# Patient Record
Sex: Female | Born: 1945 | Race: White | Hispanic: No | Marital: Married | Smoking: Current every day smoker
Health system: Southern US, Community
[De-identification: ages and names within clinical notes are randomized; demographics above are authoritative.]

## PROBLEM LIST (undated history)

## (undated) DIAGNOSIS — S61419A Laceration without foreign body of unspecified hand, initial encounter: Secondary | ICD-10-CM

## (undated) DIAGNOSIS — F410 Panic disorder [episodic paroxysmal anxiety] without agoraphobia: Secondary | ICD-10-CM

## (undated) DIAGNOSIS — F4001 Agoraphobia with panic disorder: Secondary | ICD-10-CM

## (undated) DIAGNOSIS — F329 Major depressive disorder, single episode, unspecified: Secondary | ICD-10-CM

## (undated) DIAGNOSIS — Z87442 Personal history of urinary calculi: Secondary | ICD-10-CM

---
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
## 1998-01-01 ENCOUNTER — Inpatient Hospital Stay (HOSPITAL_COMMUNITY): Admission: EM | Admit: 1998-01-01 | Discharge: 1998-01-04 | Payer: Self-pay | Admitting: Emergency Medicine

## 1998-02-21 ENCOUNTER — Other Ambulatory Visit: Admission: RE | Admit: 1998-02-21 | Discharge: 1998-02-21 | Payer: Self-pay | Admitting: Obstetrics and Gynecology

## 1999-05-09 ENCOUNTER — Other Ambulatory Visit: Admission: RE | Admit: 1999-05-09 | Discharge: 1999-05-09 | Payer: Self-pay | Admitting: Obstetrics and Gynecology

## 2000-05-12 ENCOUNTER — Other Ambulatory Visit: Admission: RE | Admit: 2000-05-12 | Discharge: 2000-05-12 | Payer: Self-pay | Admitting: Obstetrics and Gynecology

## 2000-11-06 ENCOUNTER — Emergency Department (HOSPITAL_COMMUNITY): Admission: EM | Admit: 2000-11-06 | Discharge: 2000-11-06 | Payer: Self-pay | Admitting: *Deleted

## 2000-11-06 ENCOUNTER — Encounter: Payer: Self-pay | Admitting: Emergency Medicine

## 2001-07-20 ENCOUNTER — Other Ambulatory Visit: Admission: RE | Admit: 2001-07-20 | Discharge: 2001-07-20 | Payer: Self-pay | Admitting: Obstetrics and Gynecology

## 2002-12-26 ENCOUNTER — Other Ambulatory Visit: Admission: RE | Admit: 2002-12-26 | Discharge: 2002-12-26 | Payer: Self-pay | Admitting: Obstetrics and Gynecology

## 2005-02-07 ENCOUNTER — Ambulatory Visit: Payer: Self-pay | Admitting: Internal Medicine

## 2005-11-20 ENCOUNTER — Ambulatory Visit: Payer: Self-pay | Admitting: Internal Medicine

## 2006-09-20 ENCOUNTER — Emergency Department (HOSPITAL_COMMUNITY): Admission: EM | Admit: 2006-09-20 | Discharge: 2006-09-20 | Payer: Self-pay | Admitting: Emergency Medicine

## 2006-10-02 ENCOUNTER — Emergency Department (HOSPITAL_COMMUNITY): Admission: EM | Admit: 2006-10-02 | Discharge: 2006-10-02 | Payer: Self-pay | Admitting: Emergency Medicine

## 2006-10-15 ENCOUNTER — Ambulatory Visit: Payer: Self-pay | Admitting: Internal Medicine

## 2006-10-23 ENCOUNTER — Telehealth: Payer: Self-pay | Admitting: Internal Medicine

## 2006-10-30 ENCOUNTER — Telehealth: Payer: Self-pay | Admitting: Internal Medicine

## 2006-12-07 ENCOUNTER — Telehealth: Payer: Self-pay | Admitting: Internal Medicine

## 2006-12-24 DIAGNOSIS — F329 Major depressive disorder, single episode, unspecified: Secondary | ICD-10-CM

## 2006-12-24 DIAGNOSIS — F411 Generalized anxiety disorder: Secondary | ICD-10-CM | POA: Insufficient documentation

## 2006-12-24 DIAGNOSIS — F32A Depression, unspecified: Secondary | ICD-10-CM | POA: Insufficient documentation

## 2007-03-04 ENCOUNTER — Telehealth: Payer: Self-pay | Admitting: Internal Medicine

## 2007-04-28 ENCOUNTER — Telehealth: Payer: Self-pay | Admitting: Internal Medicine

## 2007-07-12 ENCOUNTER — Encounter: Payer: Self-pay | Admitting: Internal Medicine

## 2007-07-19 ENCOUNTER — Telehealth: Payer: Self-pay | Admitting: Internal Medicine

## 2007-12-10 ENCOUNTER — Telehealth: Payer: Self-pay | Admitting: Internal Medicine

## 2007-12-29 ENCOUNTER — Telehealth: Payer: Self-pay | Admitting: *Deleted

## 2008-01-25 ENCOUNTER — Encounter: Payer: Self-pay | Admitting: Internal Medicine

## 2008-02-01 ENCOUNTER — Ambulatory Visit: Payer: Self-pay | Admitting: Internal Medicine

## 2008-02-01 LAB — CONVERTED CEMR LAB
ALT: 17 units/L (ref 0–35)
AST: 23 units/L (ref 0–37)
Albumin: 4 g/dL (ref 3.5–5.2)
Alkaline Phosphatase: 35 units/L — ABNORMAL LOW (ref 39–117)
BUN: 13 mg/dL (ref 6–23)
Basophils Absolute: 0 10*3/uL (ref 0.0–0.1)
Basophils Relative: 0.1 % (ref 0.0–3.0)
Bilirubin, Direct: 0.1 mg/dL (ref 0.0–0.3)
CO2: 30 meq/L (ref 19–32)
Calcium: 9.1 mg/dL (ref 8.4–10.5)
Chloride: 108 meq/L (ref 96–112)
Cholesterol: 246 mg/dL (ref 0–200)
Creatinine, Ser: 0.9 mg/dL (ref 0.4–1.2)
Direct LDL: 161.7 mg/dL
Eosinophils Absolute: 0.3 10*3/uL (ref 0.0–0.7)
Eosinophils Relative: 3.6 % (ref 0.0–5.0)
GFR calc Af Amer: 82 mL/min
GFR calc non Af Amer: 68 mL/min
Glucose, Bld: 92 mg/dL (ref 70–99)
HCT: 38.4 % (ref 36.0–46.0)
HDL: 54.8 mg/dL (ref >39.0)
Hemoglobin: 13.2 g/dL (ref 12.0–15.0)
Lymphocytes Relative: 44 % (ref 12.0–46.0)
MCHC: 34.4 g/dL (ref 30.0–36.0)
MCV: 91.6 fL (ref 78.0–100.0)
Monocytes Absolute: 0.7 10*3/uL (ref 0.1–1.0)
Monocytes Relative: 8.9 % (ref 3.0–12.0)
Neutro Abs: 3.6 10*3/uL (ref 1.4–7.7)
Neutrophils Relative %: 43.4 % (ref 43.0–77.0)
Platelets: 218 10*3/uL (ref 150–400)
Potassium: 3.9 meq/L (ref 3.5–5.1)
RBC: 4.19 M/uL (ref 3.87–5.11)
RDW: 12.4 % (ref 11.5–14.6)
Sodium: 145 meq/L (ref 135–145)
TSH: 1.58 microintl units/mL (ref 0.35–5.50)
Total Bilirubin: 0.4 mg/dL (ref 0.3–1.2)
Total CHOL/HDL Ratio: 4.5
Total Protein: 6.9 g/dL (ref 6.0–8.3)
Triglycerides: 63 mg/dL (ref 0–149)
VLDL: 13 mg/dL (ref 0–40)
WBC: 8.4 10*3/uL (ref 4.5–10.5)

## 2008-03-03 ENCOUNTER — Telehealth: Payer: Self-pay | Admitting: Internal Medicine

## 2008-04-19 ENCOUNTER — Ambulatory Visit: Payer: Self-pay | Admitting: Internal Medicine

## 2008-04-19 DIAGNOSIS — H6120 Impacted cerumen, unspecified ear: Secondary | ICD-10-CM

## 2008-04-19 DIAGNOSIS — M810 Age-related osteoporosis without current pathological fracture: Secondary | ICD-10-CM | POA: Insufficient documentation

## 2008-04-19 DIAGNOSIS — E785 Hyperlipidemia, unspecified: Secondary | ICD-10-CM

## 2008-05-03 ENCOUNTER — Telehealth: Payer: Self-pay | Admitting: *Deleted

## 2008-10-03 ENCOUNTER — Telehealth: Payer: Self-pay | Admitting: Internal Medicine

## 2009-01-17 ENCOUNTER — Telehealth: Payer: Self-pay | Admitting: Internal Medicine

## 2010-05-12 LAB — CONVERTED CEMR LAB: Vit D, 1,25-Dihydroxy: 46 (ref 30–89)

## 2011-05-16 ENCOUNTER — Telehealth: Payer: Self-pay | Admitting: Internal Medicine

## 2011-05-16 NOTE — Telephone Encounter (Signed)
Pt called and was last seen on 04/19/08, which is just over 3 years. Pt has [employer] now, but will have MCR and supplement in March 2013. Pt is req to come in for cpx. Pls advise.

## 2011-05-19 NOTE — Telephone Encounter (Signed)
She may have appointment

## 2011-05-19 NOTE — Telephone Encounter (Signed)
Called pt and notified her that pcp has agreed for pt to re-est with him. Pt has been sch a 30 min ov for 06/11/11 at 2:45pm. Pt req cpx and to come in fasting.

## 2011-06-11 ENCOUNTER — Ambulatory Visit: Payer: Self-pay | Admitting: Internal Medicine

## 2011-07-23 ENCOUNTER — Ambulatory Visit: Payer: Self-pay | Admitting: Internal Medicine

## 2011-09-04 ENCOUNTER — Ambulatory Visit: Payer: Self-pay | Admitting: Internal Medicine

## 2011-09-04 ENCOUNTER — Ambulatory Visit: Payer: Self-pay | Admitting: *Deleted

## 2011-09-05 ENCOUNTER — Ambulatory Visit: Payer: Self-pay | Admitting: Internal Medicine

## 2011-09-09 ENCOUNTER — Ambulatory Visit: Payer: Self-pay | Admitting: Internal Medicine

## 2011-09-12 ENCOUNTER — Telehealth: Payer: Self-pay | Admitting: Internal Medicine

## 2011-09-12 NOTE — Telephone Encounter (Signed)
Per dr Lovell Sheehan- she can come to Saturday clinic, but dr Lovell Sheehan said if she doesn't come ,we will not schedule her with anyone until after July 15 because she has no showed and cancelled so many times

## 2011-09-12 NOTE — Telephone Encounter (Signed)
Patient called back upset that she can not get in before 10/27/11 as she states she can not hear. Patient want to know if she can come in sooner. What dones Dr. Lovell Sheehan recommend?

## 2011-09-13 ENCOUNTER — Ambulatory Visit: Payer: Self-pay | Admitting: Family Medicine

## 2011-10-27 ENCOUNTER — Ambulatory Visit: Payer: Self-pay | Admitting: Internal Medicine

## 2011-12-08 ENCOUNTER — Ambulatory Visit: Payer: Self-pay | Admitting: Internal Medicine

## 2011-12-16 ENCOUNTER — Ambulatory Visit: Payer: Self-pay | Admitting: Family

## 2011-12-23 ENCOUNTER — Ambulatory Visit: Payer: Self-pay | Admitting: Family

## 2012-07-09 ENCOUNTER — Telehealth: Payer: Self-pay | Admitting: Internal Medicine

## 2012-07-09 NOTE — Telephone Encounter (Signed)
Tim Lair,  Please see Dr. Lovell Sheehan' response below re Ms. Bath.

## 2012-07-09 NOTE — Telephone Encounter (Signed)
I think discharging the patient  Is best option

## 2012-07-09 NOTE — Telephone Encounter (Signed)
Dr Lovell Sheehan - Mr. Pizzolato called in for [facility], wanting an appointment. She has not been seen since 04/19/2008, and she has canceled or no showed every appointment since that time (11). I spoke w/[place] and [place]. Tim Lair did not want to schedule her with you or anyone until speaking to you, given Mrs. Burandt appt hx. Do you want to discharge her as a patient? Please advise as to next steps for this pt. Thank you.

## 2012-07-13 NOTE — Telephone Encounter (Signed)
Mr. Clyatt called back because he has not heard from the office regarding an appt for his wife. Please advise, regarding dismissal.

## 2012-07-14 ENCOUNTER — Telehealth: Payer: Self-pay | Admitting: Internal Medicine

## 2012-07-14 NOTE — Telephone Encounter (Signed)
Dismissal Letter sent by Certified Mail 07/14/2012  Received the Return Receipt showing someone picked up the Dismissal Letter 07/20/2012

## 2012-07-22 NOTE — Telephone Encounter (Signed)
Discharge letter generated on 07/09/12.

## 2015-03-28 ENCOUNTER — Encounter (HOSPITAL_COMMUNITY): Payer: Self-pay | Admitting: Neurology

## 2015-03-28 ENCOUNTER — Emergency Department (HOSPITAL_COMMUNITY): Payer: Medicare Other

## 2015-03-28 ENCOUNTER — Observation Stay (HOSPITAL_COMMUNITY)
Admission: EM | Admit: 2015-03-28 | Discharge: 2015-03-29 | Disposition: A | Discharge status: Home / Self Care | Payer: Medicare Other | Attending: Student in an Organized Health Care Education/Training Program | Admitting: Student in an Organized Health Care Education/Training Program

## 2015-03-28 DIAGNOSIS — Y93G3 Activity, cooking and baking: Secondary | ICD-10-CM | POA: Insufficient documentation

## 2015-03-28 DIAGNOSIS — S61412A Laceration without foreign body of left hand, initial encounter: Secondary | ICD-10-CM | POA: Diagnosis present

## 2015-03-28 DIAGNOSIS — L03114 Cellulitis of left upper limb: Secondary | ICD-10-CM | POA: Insufficient documentation

## 2015-03-28 DIAGNOSIS — M19042 Primary osteoarthritis, left hand: Secondary | ICD-10-CM | POA: Diagnosis not present

## 2015-03-28 DIAGNOSIS — Y998 Other external cause status: Secondary | ICD-10-CM | POA: Diagnosis not present

## 2015-03-28 DIAGNOSIS — Y929 Unspecified place or not applicable: Secondary | ICD-10-CM

## 2015-03-28 DIAGNOSIS — Y92000 Kitchen of unspecified non-institutional (private) residence as  the place of occurrence of the external cause: Secondary | ICD-10-CM | POA: Insufficient documentation

## 2015-03-28 DIAGNOSIS — W260XXD Contact with knife, subsequent encounter: Secondary | ICD-10-CM

## 2015-03-28 DIAGNOSIS — F411 Generalized anxiety disorder: Secondary | ICD-10-CM | POA: Diagnosis not present

## 2015-03-28 DIAGNOSIS — S61012D Laceration without foreign body of left thumb without damage to nail, subsequent encounter: Secondary | ICD-10-CM

## 2015-03-28 DIAGNOSIS — R339 Retention of urine, unspecified: Secondary | ICD-10-CM | POA: Insufficient documentation

## 2015-03-28 DIAGNOSIS — Z880 Allergy status to penicillin: Secondary | ICD-10-CM | POA: Insufficient documentation

## 2015-03-28 DIAGNOSIS — F17210 Nicotine dependence, cigarettes, uncomplicated: Secondary | ICD-10-CM | POA: Insufficient documentation

## 2015-03-28 DIAGNOSIS — W260XXA Contact with knife, initial encounter: Secondary | ICD-10-CM | POA: Diagnosis not present

## 2015-03-28 DIAGNOSIS — F4000 Agoraphobia, unspecified: Secondary | ICD-10-CM | POA: Insufficient documentation

## 2015-03-28 DIAGNOSIS — Z79899 Other long term (current) drug therapy: Secondary | ICD-10-CM | POA: Diagnosis not present

## 2015-03-28 DIAGNOSIS — M8580 Other specified disorders of bone density and structure, unspecified site: Secondary | ICD-10-CM | POA: Insufficient documentation

## 2015-03-28 DIAGNOSIS — L0390 Cellulitis, unspecified: Secondary | ICD-10-CM | POA: Diagnosis present

## 2015-03-28 DIAGNOSIS — F4001 Agoraphobia with panic disorder: Secondary | ICD-10-CM

## 2015-03-28 HISTORY — DX: Panic disorder (episodic paroxysmal anxiety): F41.0

## 2015-03-28 HISTORY — DX: Laceration without foreign body of unspecified hand, initial encounter: S61.419A

## 2015-03-28 HISTORY — DX: Agoraphobia with panic disorder: F40.01

## 2015-03-28 HISTORY — DX: Major depressive disorder, single episode, unspecified: F32.9

## 2015-03-28 LAB — CBC WITH DIFFERENTIAL/PLATELET
BASOS ABS: 0.1 10*3/uL (ref 0.0–0.1)
Basophils Relative: 1 %
Eosinophils Absolute: 0.3 10*3/uL (ref 0.0–0.7)
Eosinophils Relative: 3 %
HEMATOCRIT: 41.1 % (ref 36.0–46.0)
Hemoglobin: 13.6 g/dL (ref 12.0–15.0)
LYMPHS PCT: 31 %
Lymphs Abs: 3.3 10*3/uL (ref 0.7–4.0)
MCH: 30.2 pg (ref 26.0–34.0)
MCHC: 33.1 g/dL (ref 30.0–36.0)
MCV: 91.3 fL (ref 78.0–100.0)
MONO ABS: 1 10*3/uL (ref 0.1–1.0)
Monocytes Relative: 9 %
NEUTROS ABS: 6.1 10*3/uL (ref 1.7–7.7)
Neutrophils Relative %: 56 %
Platelets: 296 10*3/uL (ref 150–400)
RBC: 4.5 MIL/uL (ref 3.87–5.11)
RDW: 13.4 % (ref 11.5–15.5)
WBC: 10.8 10*3/uL — AB (ref 4.0–10.5)

## 2015-03-28 LAB — BASIC METABOLIC PANEL
ANION GAP: 8 (ref 5–15)
BUN: 7 mg/dL (ref 6–20)
CALCIUM: 9.1 mg/dL (ref 8.9–10.3)
CO2: 24 mmol/L (ref 22–32)
CREATININE: 0.9 mg/dL (ref 0.44–1.00)
Chloride: 108 mmol/L (ref 101–111)
GFR calc non Af Amer: >60 mL/min (ref >60)
Glucose, Bld: 96 mg/dL (ref 65–99)
Potassium: 3.8 mmol/L (ref 3.5–5.1)
Sodium: 140 mmol/L (ref 135–145)

## 2015-03-28 LAB — URINALYSIS, ROUTINE W REFLEX MICROSCOPIC
Bilirubin Urine: NEGATIVE
Glucose, UA: NEGATIVE mg/dL
Hgb urine dipstick: NEGATIVE
KETONES UR: NEGATIVE mg/dL
LEUKOCYTES UA: NEGATIVE
NITRITE: NEGATIVE
PH: 5 (ref 5.0–8.0)
Protein, ur: NEGATIVE mg/dL
SPECIFIC GRAVITY, URINE: 1.01 (ref 1.005–1.030)

## 2015-03-28 MED ORDER — CLINDAMYCIN PHOSPHATE 600 MG/50ML IV SOLN
600.0000 mg | Freq: Three times a day (TID) | INTRAVENOUS | Status: DC
  Administered 2015-03-28 – 2015-03-29 (×2): 600 mg via INTRAVENOUS
  Filled 2015-03-28 (×2): qty 50

## 2015-03-28 MED ORDER — ACETAMINOPHEN 325 MG PO TABS: 650.0000 mg | ORAL_TABLET | Freq: Four times a day (QID) | ORAL | Status: DC | PRN

## 2015-03-28 MED ORDER — DIAZEPAM 5 MG PO TABS
5.0000 mg | ORAL_TABLET | Freq: Two times a day (BID) | ORAL | Status: DC | PRN
  Administered 2015-03-28 – 2015-03-29 (×2): 10 mg via ORAL
  Filled 2015-03-28 (×2): qty 2

## 2015-03-28 MED ORDER — BACITRACIN ZINC 500 UNIT/GM EX OINT
TOPICAL_OINTMENT | Freq: Every day | CUTANEOUS | Status: DC
  Administered 2015-03-29: 10:00:00 via TOPICAL
  Filled 2015-03-28 (×2): qty 28.35

## 2015-03-28 MED ORDER — SODIUM CHLORIDE 0.9 % IV SOLN: 250.0000 mL | INTRAVENOUS | Status: DC | PRN

## 2015-03-28 MED ORDER — DIAZEPAM 5 MG PO TABS: 5.0000 mg | ORAL_TABLET | Freq: Two times a day (BID) | ORAL | Status: DC | PRN

## 2015-03-28 MED ORDER — ACETAMINOPHEN 650 MG RE SUPP: 650.0000 mg | Freq: Four times a day (QID) | RECTAL | Status: DC | PRN

## 2015-03-28 MED ORDER — ALPRAZOLAM 0.5 MG PO TABS
1.0000 mg | ORAL_TABLET | Freq: Two times a day (BID) | ORAL | Status: DC
  Administered 2015-03-28 – 2015-03-29 (×2): 1 mg via ORAL
  Filled 2015-03-28 (×2): qty 2

## 2015-03-28 MED ORDER — SODIUM CHLORIDE 0.9 % IJ SOLN
3.0000 mL | Freq: Two times a day (BID) | INTRAMUSCULAR | Status: DC
  Administered 2015-03-28 (×2): 3 mL via INTRAVENOUS

## 2015-03-28 MED ORDER — ENOXAPARIN SODIUM 40 MG/0.4ML ~~LOC~~ SOLN
40.0000 mg | SUBCUTANEOUS | Status: DC
  Filled 2015-03-28: qty 0.4

## 2015-03-28 MED ORDER — LORAZEPAM 1 MG PO TABS
1.0000 mg | ORAL_TABLET | Freq: Once | ORAL | Status: AC
  Administered 2015-03-28: 1 mg via ORAL
  Filled 2015-03-28: qty 1

## 2015-03-28 MED ORDER — SODIUM CHLORIDE 0.9 % IJ SOLN: 3.0000 mL | INTRAMUSCULAR | Status: DC | PRN

## 2015-03-28 MED ORDER — CLINDAMYCIN PHOSPHATE 600 MG/50ML IV SOLN
600.0000 mg | Freq: Once | INTRAVENOUS | Status: AC
  Administered 2015-03-28: 600 mg via INTRAVENOUS
  Filled 2015-03-28: qty 50

## 2015-03-28 NOTE — Consult Note (Signed)
ORTHOPAEDIC CONSULTATION HISTORY & PHYSICAL REQUESTING PHYSICIAN: Tyson Aliasuncan Thomas Vincent, MD  Chief Complaint: Left thumb wound  HPI: Kimberly Noble is a 69 y.o. female who reportedly lacerated the radial aspect of her left thumb, at about the location of the neck of the metacarpal, on Friday, 03-16-15. She lacerated herself accidentally in the kitchen with a knife that had been slicing onions, but had not been used for any raw meat. She reports that she was initially treated at an urgent care, where the wound was closed and she was provided tetanus vaccination. She returned to the urgent care, where secondary to concerns of infection, she reports that sutures were removed. She was also placed on oral antibiotics. She was admitted today for this wound with concerns of persisting infection and lack of progress healing despite oral antibiotics.  Past Medical History  Diagnosis Date  . Phobia    History reviewed. No pertinent past surgical history. Social History   Social History  . Marital Status: Married    Spouse Name: N/A  . Number of Children: N/A  . Years of Education: N/A   Social History Main Topics  . Smoking status: Current Every Day Smoker  . Smokeless tobacco: None  . Alcohol Use: No  . Drug Use: None  . Sexual Activity: Not Asked   Other Topics Concern  . None   Social History Narrative  . None   No family history on file. Allergies  Allergen Reactions  . Penicillins Rash   Prior to Admission medications   Medication Sig Start Date End Date Taking? Authorizing Provider  ALPRAZolam Prudy Feeler(XANAX) 1 MG tablet Take 1 mg by mouth 2 (two) times daily. Anxiety   Yes Historical Provider, MD  calcium-vitamin D (OSCAL WITH D) 500-200 MG-UNIT tablet Take 1 tablet by mouth daily with breakfast.   Yes Historical Provider, MD  diazepam (VALIUM) 10 MG tablet Take 10 mg by mouth 2 (two) times daily.    Yes Historical Provider, MD  doxycycline (VIBRAMYCIN) 100 MG capsule Take 100  mg by mouth 2 (two) times daily. For 10 days. Started on 03-26-15   Yes Historical Provider, MD  Multiple Vitamins-Minerals (MULTIVITAMIN WITH MINERALS) tablet Take 1 tablet by mouth daily.   Yes Historical Provider, MD  vitamin C (ASCORBIC ACID) 500 MG tablet Take 500 mg by mouth daily.   Yes Historical Provider, MD  vitamin E 100 UNIT capsule Take 100 Units by mouth daily.   Yes Historical Provider, MD   Dg Hand Complete Left  03/28/2015  CLINICAL DATA:  69 year old female with laceration of the left hand 10 days ago. Progressive pain and swelling proximal metacarpal area. Initial encounter. EXAM: LEFT HAND - COMPLETE 3+ VIEW COMPARISON:  None. FINDINGS: Soft tissue swelling apparent over the dorsal second metatarsal. No subcutaneous gas identified. No radiopaque foreign body identified. Distal radius and ulna intact. Carpal bone alignment within normal limits. Osteopenia. Metacarpals intact. Phalanges intact. Distal joint space osteoarthritis. IMPRESSION: Soft tissue swelling about the dorsal second metacarpal. No acute osseous abnormality identified. No subcutaneous gas or retained radiopaque foreign body. Electronically Signed   By: Odessa FlemingH  Hall M.D.   On: 03/28/2015 10:55    Positive ROS: All other systems have been reviewed and were otherwise negative with the exception of those mentioned in the HPI and as above.  Physical Exam: Vitals: Refer to EMR. Constitutional:  WD, WN, NAD HEENT:  NCAT, EOMI Neuro/Psych:  Alert & oriented to person, place, and time; appropriate mood & affect Lymphatic:  No generalized extremity edema or lymphadenopathy Extremities / MSK:  The extremities are normal with respect to appearance, ranges of motion, joint stability, muscle strength/tone, sensation, & perfusion except as otherwise noted:  There is a laceration that is transverse in orientation about 1-1/2-2 cm in length on the radial aspect of the thumb over the metacarpal. There is mild redness just about the  margins of the wound with no spreading redness and no fluctuance. The MP joint is stable to varus and valgus stress. Intact light touch sensibility on the radial and ulnar aspects of the thumb tip. She can actively flex and extend and abduct the thumb. There is peeling of the epidermal layer about the wound.  Assessment: Approximately 6-week-old left thumb superficial laceration   Actions/recommendations: I used thumb pressure to open the remainder of the wound, and washed it under running water in the sink. There was no expressible purulence. There is no abscess. I peeled the epidermal tissue that was peeling, and redressed the wound.  At the present time, this patient requires no further surgical intervention, no further wound debridement, and appears not to have an abscess. I recommend allowing this wound heal secondarily, which may take 2-4 weeks. As for daily wound care, I recommend that she simply wash it in the shower or under running water with soap and water daily, placing a very small dab of bacitracin ointment on the wound itself, covering it with a sterile 2 x 2 (not Telfa) and using roller gauze or Coban to hold that in place. I think it would be fine for her to be discharged on oral antibiotics, perhaps clindamycin, to be taken for an additional 5-7 days. She is a self-described germ-o-phobe, so I think it is important that it be reiterated to her not to treat the wound and any additional ways, not to apply hand sanitizer or other compounds to the wound, not to use Betadine or peroxide on it, etc. She should be encouraged to use her thumb as she tolerates.  She may follow-up with me in a week for a wound check.  Cliffton Asters Janee Morn, MD      Orthopaedic & Hand Surgery [facility] [address] [place], [place]  [address] Office: [phone] Mobile: [phone]

## 2015-03-28 NOTE — ED Provider Notes (Signed)
CSN: [id number]   Arrival date & time 03/28/15  1478 History   First MD Initiated Contact with Patient 03/28/15 1005     Chief Complaint  Patient presents with  . Extremity Laceration     HPI   Kimberly Noble is a 69 y.o. female with a PMH of panic disorder and agoraphobia who presents to the ED with infected laceration. She states she was cutting an onion on 12/2 and cut herself with the knife. She went to urgent care and had sutures placed at that time. She states over the next week, she started to notice discharge coming from her wound, and returned to urgent care on 12/9 and subsequently had her sutures removed. She was started on doxycycline, which she states she has been taking as directed. She reports persistent edema, erythema, and drainage. She denies fever, chills, numbness, weakness, paresthesia.   Past Medical History  Diagnosis Date  . Phobia    History reviewed. No pertinent past surgical history. No family history on file. Social History  Substance Use Topics  . Smoking status: Current Every Day Smoker  . Smokeless tobacco: None  . Alcohol Use: No   OB History    No data available      Review of Systems  Constitutional: Negative for fever and chills.  Skin: Positive for color change and wound.  Neurological: Negative for weakness and numbness.  All other systems reviewed and are negative.     Allergies  Penicillins  Home Medications   Prior to Admission medications   Medication Sig Start Date End Date Taking? Authorizing Provider  ALPRAZolam Prudy Feeler) 1 MG tablet Take 1 mg by mouth 2 (two) times daily. Anxiety   Yes Historical Provider, MD  calcium-vitamin D (OSCAL WITH D) 500-200 MG-UNIT tablet Take 1 tablet by mouth daily with breakfast.   Yes Historical Provider, MD  diazepam (VALIUM) 10 MG tablet Take 10 mg by mouth 2 (two) times daily.    Yes Historical Provider, MD  doxycycline (VIBRAMYCIN) 100 MG capsule Take 100 mg by mouth 2 (two) times  daily. For 10 days. Started on 03-26-15   Yes Historical Provider, MD  Multiple Vitamins-Minerals (MULTIVITAMIN WITH MINERALS) tablet Take 1 tablet by mouth daily.   Yes Historical Provider, MD  vitamin C (ASCORBIC ACID) 500 MG tablet Take 500 mg by mouth daily.   Yes Historical Provider, MD  vitamin E 100 UNIT capsule Take 100 Units by mouth daily.   Yes Historical Provider, MD    BP 99/74 mmHg  Pulse 90  Temp(Src) 98.1 F (36.7 C) (Oral)  Resp 18  SpO2 98% Physical Exam  Constitutional: She is oriented to person, place, and time. She appears well-developed and well-nourished. No distress.  Patient appears anxious.  HENT:  Head: Normocephalic and atraumatic.  Right Ear: External ear normal.  Left Ear: External ear normal.  Nose: Nose normal.  Eyes: Conjunctivae and EOM are normal. Right eye exhibits no discharge. Left eye exhibits no discharge. No scleral icterus.  Neck: Normal range of motion. Neck supple.  Cardiovascular: Normal rate, regular rhythm and intact distal pulses.   Pulmonary/Chest: Effort normal and breath sounds normal. No respiratory distress.  Musculoskeletal: Normal range of motion. She exhibits edema and tenderness.  Neurological: She is alert and oriented to person, place, and time. She has normal strength. No sensory deficit.  Skin: Skin is warm and dry. She is not diaphoretic. There is erythema.  2 cm laceration to left thenar eminence with  surrounding erythema, edema, and purulent drainage. Mild associated TTP.  Psychiatric: She has a normal mood and affect. Her behavior is normal.  Nursing note and vitals reviewed.   ED Course  Procedures (including critical care time)  Labs Review Labs Reviewed  CBC WITH DIFFERENTIAL/PLATELET - Abnormal; Notable for the following:    WBC 10.8 (*)    All other components within normal limits  BASIC METABOLIC PANEL  URINALYSIS, ROUTINE W REFLEX MICROSCOPIC (NOT AT [facility])  CBC    Imaging Review Dg Hand Complete  Left  03/28/2015  CLINICAL DATA:  69 year old female with laceration of the left hand 10 days ago. Progressive pain and swelling proximal metacarpal area. Initial encounter. EXAM: LEFT HAND - COMPLETE 3+ VIEW COMPARISON:  None. FINDINGS: Soft tissue swelling apparent over the dorsal second metatarsal. No subcutaneous gas identified. No radiopaque foreign body identified. Distal radius and ulna intact. Carpal bone alignment within normal limits. Osteopenia. Metacarpals intact. Phalanges intact. Distal joint space osteoarthritis. IMPRESSION: Soft tissue swelling about the dorsal second metacarpal. No acute osseous abnormality identified. No subcutaneous gas or retained radiopaque foreign body. Electronically Signed   By: Odessa FlemingH  Hall M.D.   On: 03/28/2015 10:55     I have personally reviewed and evaluated these images and lab results as part of my medical decision-making.   EKG Interpretation None      MDM   Final diagnoses:  Cellulitis of left hand    69 year old female presents with infection overlying laceration of her left hand (failed doxycyline as an outpatient). Reports erythema, edema, and purulent drainage. Denies fever, chills, abdominal pain, N/V, numbness, weakness, paresthesia. On exam, patient has 2 cm laceration to her left thenar eminence with surrounding erythema, edema, and purulent drainage and mild associated TTP.   Imaging of left hand remarkable for soft tissue swelling of the dorsal second metacarpal, no acute osseous abnormality, no subcutaneous gas or retained radiopaque foreign body. CBC remarkable for mild leukocytosis of 10.8, BMP within normal limits.  Patient started on IV clindamycin. Patient discussed with and seen by Dr. Rubin PayorPickering. Unassigned consulted for admission. Patient to be admitted to internal medicine teaching service (Dr. Oswaldo DoneVincent) for further evaluation and management.  BP 99/74 mmHg  Pulse 90  Temp(Src) 98.1 F (36.7 C) (Oral)  Resp 18  SpO2  98%     [name], PA-C 03/28/15 1928  Benjiman CoreNathan Pickering, MD 03/29/15 [phone]

## 2015-03-28 NOTE — ED Notes (Signed)
Spoke with ED Doctor blood cultures not ordered at this time and start antibiotics.

## 2015-03-28 NOTE — H&P (Signed)
Date: 03/28/2015               Patient Name:  Kimberly Noble MRN: [id number]  DOB: [birth year] Age / Sex: 69 y.o., female   PCP: No primary care provider on file.         Medical Service: Internal Medicine Teaching Service         Attending Physician: Dr. Tyson Alias, MD    First Contact: Dr. Reubin Milan Pager: [phone]  Second Contact: Dr. Griffin Basil Pager: [phone]       After Hours (After 5p/  First Contact Pager: [phone]  weekends / holidays): Second Contact Pager: [phone]   Chief Complaint: Thumb laceration with white/yellow drainage  History of Present Illness: Kimberly Noble is a 69yo F with PMH generalized anxiety disorder with agoraphobia, major depressive disorder who presents with a thumb laceration with drainage. Two weeks ago, she was cutting an onion with a kitchen knife and sliced her L thumb. She went to an urgent care where the laceration was stitched up and given a tetanus booster. She noted that 'her fingers were turning blue', the area around the cut was red and she could express whitish/yellowish drainage from the cut so she returned to the urgent care 5 days ago, where the stiches were removed and she was given doxycycline. She then presented to the ED today because the wound wasn't getting any better and there was still purulent drainage. Throughout this episode, she denies fevers, chills, myalgias, hand weakness or numbness. She does feel like her hand is more swollen than the unaffected hand and it is more difficult to close currently. She denies shortness of breath, chest pain, abdominal pain, nausea/vomiting, diarrhea. She does say that since yesterday, she has had been unable to void although she has tried. She denies other symptoms at this time.  PMH: Generalized anxiety disorder on long term benzodiazepines, severe agoraphobia, major depressive disorder with h/o suicide attempts  PSH: Tonsillectomy in teens  Meds: Xanax and valium PRN for  anxiety  Allergies: PCN (rash)  SH: 1/2 ppd smoker for 40 years, ~20pyh. No alcohol abuse, no h/o recreational or IV drug use.  FH: No significant history of cancer, CVD, DM, recurrent skin infections/MRSA  Meds: Current Facility-Administered Medications  Medication Dose Route Frequency Provider Last Rate Last Dose  . 0.9 %  sodium chloride infusion  250 mL Intravenous PRN Lora Paula, MD      . acetaminophen (TYLENOL) tablet 650 mg  650 mg Oral Q6H PRN Lora Paula, MD       Or  . acetaminophen (TYLENOL) suppository 650 mg  650 mg Rectal Q6H PRN Lora Paula, MD      . enoxaparin (LOVENOX) injection 40 mg  40 mg Subcutaneous Q24H Lora Paula, MD      . sodium chloride 0.9 % injection 3 mL  3 mL Intravenous Q12H Lora Paula, MD      . sodium chloride 0.9 % injection 3 mL  3 mL Intravenous PRN Lora Paula, MD        Allergies: Allergies as of 03/28/2015 - Review Complete 03/28/2015  Allergen Reaction Noted  . Penicillins Rash 12/24/2006   Past Medical History  Diagnosis Date  . Phobia    History reviewed. No pertinent past surgical history. No family history on file. Social History   Social History  . Marital Status: Married    Spouse Name: N/A  . Number of Children: N/A  .  Years of Education: N/A   Occupational History  . Not on file.   Social History Main Topics  . Smoking status: Current Every Day Smoker  . Smokeless tobacco: Not on file  . Alcohol Use: No  . Drug Use: Not on file  . Sexual Activity: Not on file   Other Topics Concern  . Not on file   Social History Narrative  . No narrative on file    Review of Systems: Pertinent items noted in HPI and remainder of comprehensive ROS otherwise negative.  Physical Exam: Blood pressure 99/74, pulse 90, temperature 98.1 F (36.7 C), temperature source Oral, resp. rate 18, SpO2 98 %. Gen: Well-appearing, alert and oriented to person, place, and time HEENT: Oropharynx clear  without erythema or exudate.  Neck: No cervical LAD, no thyromegaly or nodules, no JVD noted. CV: Normal rate, regular rhythm, no murmurs, rubs, or gallops Pulmonary: Normal effort, CTA bilaterally, no wheezing, rales, or rhonchi Abdominal: Soft, non-tender, non-distended, without rebound, guarding, or masses Extremities: Distal pulses 2+ in upper and lower extremities bilaterally, no tenderness, erythema or edema. The L hand is slightly swollen but non-erythematous. There is an appox 3cm laceration along the L thenar eminence with scant purulent drainage. Ecchymotic appearance around the laceration with erythema around the rim, without increased warmth. Skin: No atypical appearing moles. No rashes   Lab results: Basic Metabolic Panel:  Recent Labs  13/08/65 1230  NA 140  K 3.8  CL 108  CO2 24  GLUCOSE 96  BUN 7  CREATININE 0.90  CALCIUM 9.1   CBC:  Recent Labs  03/28/15 1230  WBC 10.8*  NEUTROABS 6.1  HGB 13.6  HCT 41.1  MCV 91.3  PLT 296   Imaging results:  Dg Hand Complete Left  03/28/2015  CLINICAL DATA:  69 year old female with laceration of the left hand 10 days ago. Progressive pain and swelling proximal metacarpal area. Initial encounter. EXAM: LEFT HAND - COMPLETE 3+ VIEW COMPARISON:  None. FINDINGS: Soft tissue swelling apparent over the dorsal second metatarsal. No subcutaneous gas identified. No radiopaque foreign body identified. Distal radius and ulna intact. Carpal bone alignment within normal limits. Osteopenia. Metacarpals intact. Phalanges intact. Distal joint space osteoarthritis. IMPRESSION: Soft tissue swelling about the dorsal second metacarpal. No acute osseous abnormality identified. No subcutaneous gas or retained radiopaque foreign body. Electronically Signed   By: Odessa Fleming M.D.   On: 03/28/2015 10:55   Assessment & Plan by Problem: Active Problems:   Cellulitis 1. L hand laceration - from kitchen knife, not improved over the past 2 weeks per pt  and with purulent drainage. X-ray negative for gas or foreign body retention. Pt is s/p 5 days of doxy without apparent improvement. Likely cellulitis in setting of laceration but no improvement with doxy per patient, so some concern for underlying abscess -Clinda  IV -Will consult hand surgery -Tylenol PRN for pain  2. Severe anxiety w agoraphobia - says she 'hasn't left the house in 5 years', on Xanax  BID and  Valium BID but isn't entirely sure on those dosages, either way this is significantly less than what she has taken in the past. No history of withdrawal symptoms per pt. -Continue home Xanax -Valium PRN for anxiety -Low threshold to increase benzo dose given long term high dose use  3. Urinary retention - since yesterday. Cr 0.9 here which is her baseline. No other urinary symptoms. No h/o UTIs. Possibly behavioral or iatrogenic (benzo use) vs infection. PVR here was  900ccs - will I&O cath now. -I&O cath PRN -F/u UA, C&S  Ppx - DVT - lovenox  Diet - regular  Dispo: Disposition is deferred at this time, awaiting improvement of current medical problems. Anticipated discharge in approximately 1 day(s).   The patient does not have a current PCP (No primary care provider on file.) and [facility].  The patient does have transportation limitations that hinder transportation to clinic appointments.  Signed: Darrick HuntsmanWilliam R Alaiya Martindelcampo, MD 03/28/2015, 3:55 PM

## 2015-03-28 NOTE — ED Notes (Signed)
2 weeks ago pt was cutting an onion and cut her left thumb with a knife. Went to an urgent care and had stitches placed. Went back 1 week later, told she had a staph infection was started on doxycycline. Is here for swelling to thumb and white/yellow drainage. Pt has agoraphobia and this is the first time she has left the house in 5 years, pt very anxious at current.

## 2015-03-28 NOTE — Discharge Instructions (Signed)
WOUND CARE: Remove bandage from left thumb wound. Wash it with soap and water in the shower or under running water. Apply a very small dab of bacitracin ointment, just to the open cleft of the wound. Cover this with a 2 x 2 (not Telfa) and then use part of a role of 2 inch Conform to secure the 2 x 2 in place. The bandage should be light and allow her fingers and thumb to move naturally.

## 2015-03-29 DIAGNOSIS — S61012D Laceration without foreign body of left thumb without damage to nail, subsequent encounter: Secondary | ICD-10-CM | POA: Diagnosis not present

## 2015-03-29 DIAGNOSIS — W260XXD Contact with knife, subsequent encounter: Secondary | ICD-10-CM | POA: Diagnosis not present

## 2015-03-29 DIAGNOSIS — Y929 Unspecified place or not applicable: Secondary | ICD-10-CM | POA: Diagnosis not present

## 2015-03-29 LAB — CBC
HCT: 39.1 % (ref 36.0–46.0)
HEMOGLOBIN: 12.8 g/dL (ref 12.0–15.0)
MCH: 30 pg (ref 26.0–34.0)
MCHC: 32.7 g/dL (ref 30.0–36.0)
MCV: 91.8 fL (ref 78.0–100.0)
PLATELETS: 272 10*3/uL (ref 150–400)
RBC: 4.26 MIL/uL (ref 3.87–5.11)
RDW: 13.5 % (ref 11.5–15.5)
WBC: 7.7 10*3/uL (ref 4.0–10.5)

## 2015-03-29 MED ORDER — CLINDAMYCIN HCL 300 MG PO CAPS: 300.0000 mg | ORAL_CAPSULE | Freq: Four times a day (QID) | ORAL | Status: DC

## 2015-03-29 NOTE — Progress Notes (Signed)
Subjective: NAEON. Afebrile, no hand pain. Still having intermittent difficulties voiding, but is able to spontaneously now.   Objective: Vital signs in last 24 hours: Filed Vitals:   03/28/15 1534 03/28/15 2112 03/29/15 0034 03/29/15 0530  BP: 99/74 110/77  101/58  Pulse: 90 68  67  Temp: 98.1 F (36.7 C) 98.5 F (36.9 C)  97.9 F (36.6 C)  TempSrc: Oral Oral  Oral  Resp: 18 20  20   Height:   4\' 11"  (1.499 m)   Weight:   129 lb 8 oz (58.741 kg)   SpO2: 98% 99%  99%   Weight change:   Intake/Output Summary (Last 24 hours) at 03/29/15 1116 Last data filed at 03/29/15 0630  Gross per 24 hour  Intake    500 ml  Output  [id number] ml  Net -10500 ml     Gen: Well-appearing, alert and oriented to person, place, and time HEENT: Oropharynx clear without erythema or exudate.  Neck: No cervical LAD, no thyromegaly or nodules, no JVD noted. CV: Normal rate, regular rhythm, no murmurs, rubs, or gallops Pulmonary: Normal effort, CTA bilaterally, no wheezing, rales, or rhonchi Abdominal: Soft, non-tender, non-distended, without rebound, guarding, or masses Extremities: Distal pulses 2+ in upper and lower extremities bilaterally, no tenderness, erythema or edema. The L hand is slightly swollen but non-erythematous. There is an appox 3cm laceration along the L thenar eminence with scant purulent drainage. Ecchymotic appearance around the laceration with erythema around the rim, without increased warmth. Skin: No atypical appearing moles. No rashes  Lab Results: Basic Metabolic Panel:  Recent Labs Lab 03/28/15 1230  NA 140  K 3.8  CL 108  CO2 24  GLUCOSE 96  BUN 7  CREATININE 0.90  CALCIUM 9.1   CBC:  Recent Labs Lab 03/28/15 1230 03/29/15 0550  WBC 10.8* 7.7  NEUTROABS 6.1  --   HGB 13.6 12.8  HCT 41.1 39.1  MCV 91.3 91.8  PLT 296 272   Urinalysis:  Recent Labs Lab 03/28/15 1548  COLORURINE YELLOW  LABSPEC 1.010  PHURINE 5.0  GLUCOSEU NEGATIVE  HGBUR NEGATIVE   BILIRUBINUR NEGATIVE  KETONESUR NEGATIVE  PROTEINUR NEGATIVE  NITRITE NEGATIVE  LEUKOCYTESUR NEGATIVE   Studies/Results: Dg Hand Complete Left  03/28/2015  CLINICAL DATA:  69 year old female with laceration of the left hand 10 days ago. Progressive pain and swelling proximal metacarpal area. Initial encounter. EXAM: LEFT HAND - COMPLETE 3+ VIEW COMPARISON:  None. FINDINGS: Soft tissue swelling apparent over the dorsal second metatarsal. No subcutaneous gas identified. No radiopaque foreign body identified. Distal radius and ulna intact. Carpal bone alignment within normal limits. Osteopenia. Metacarpals intact. Phalanges intact. Distal joint space osteoarthritis. IMPRESSION: Soft tissue swelling about the dorsal second metacarpal. No acute osseous abnormality identified. No subcutaneous gas or retained radiopaque foreign body. Electronically Signed   By: Odessa FlemingH  Hall M.D.   On: 03/28/2015 10:55   Assessment/Plan: Active Problems:   Cellulitis   Cellulitis of left hand 1. L hand laceration - from kitchen knife, not improved over the past 2 weeks per pt and with purulent drainage. X-ray negative for gas or foreign body retention. Pt is s/p 5 days of doxy without apparent improvement. Likely cellulitis in setting of laceration but no improvement with doxy per patient. -Continue clinda PO at home -Follow up with hand clinic in 1 week -Reiterated wound care with patient  2. Urinary retention - resolving, is s/p I&O caths  Dispo: Disposition is deferred at this time, awaiting improvement  of current medical problems.  Anticipated discharge today.  The patient does not have a current PCP (No Pcp Per Patient) and does need an [facility] hospital follow-up appointment after discharge.  The patient does have transportation limitations that hinder transportation to clinic appointments.     LOS: 1 day   Darrick Huntsman, MD 03/29/2015, 11:16 AM

## 2015-03-29 NOTE — Care Management Note (Signed)
Case Management Note  Patient Details  Name: Kimberly Noble MRN: [id number] Date of Birth: 04/02/1946  Subjective/Objective:                  Independent patient from home with with husband, placed in observation for hand cellulitis obtained after accidentally cutting her self while cutting an onion. Patient is agoraphobic, does not leave house much, husband at home to support. Insured, has follow up scheduled.   Action/Plan:  No further CM needs.  Expected Discharge Date:                  Expected Discharge Plan:  Home/Self Care  In-House Referral:     Discharge planning Services  CM Consult  Post Acute Care Choice:    Choice offered to:     DME Arranged:    DME Agency:     HH Arranged:    HH Agency:     Status of Service:  Completed, signed off  Medicare Important Message Given:    Date Medicare IM Given:    Medicare IM give by:    Date Additional Medicare IM Given:    Additional Medicare Important Message give by:     If discussed at Long Length of Stay Meetings, dates discussed:    Additional Comments:  Lawerance SabalDebbie Sheilyn Boehlke, RN 03/29/2015, 12:18 PM

## 2015-03-30 NOTE — Discharge Summary (Signed)
Name: Kimberly FuchsShirley S Coble MRN: [id number] DOB: 11/17/1945 69 y.o. PCP: No Pcp Per Patient  Date of Admission: 03/28/2015  9:50 AM Date of Discharge: 03/30/2015 Attending Physician: No att. providers found  Discharge Diagnosis: 1. Thumb laceration  Active Problems:   Cellulitis   Cellulitis of left hand  Discharge Medications:   Medication List    STOP taking these medications        doxycycline 100 MG capsule  Commonly known as:  VIBRAMYCIN      TAKE these medications        ALPRAZolam 1 MG tablet  Commonly known as:  XANAX  Take 1 mg by mouth 2 (two) times daily. Anxiety     calcium-vitamin D 500-200 MG-UNIT tablet  Commonly known as:  OSCAL WITH D  Take 1 tablet by mouth daily with breakfast.     clindamycin 300 MG capsule  Commonly known as:  CLEOCIN  Take 1 capsule (300 mg total) by mouth 4 (four) times daily.     diazepam 10 MG tablet  Commonly known as:  VALIUM  Take 10 mg by mouth 2 (two) times daily.     multivitamin with minerals tablet  Take 1 tablet by mouth daily.     vitamin C 500 MG tablet  Commonly known as:  ASCORBIC ACID  Take 500 mg by mouth daily.     vitamin E 100 UNIT capsule  Take 100 Units by mouth daily.        Disposition and follow-up:   [facility]Kimberly Noble [facility].  At the hospital follow up visit please address:  1.  Follow-up with hand surgery, completion of antibiotics, laceration healing  2.  Labs / imaging needed at time of follow-up: None  3.  Pending labs/ test needing follow-up: None  Follow-up Appointments: Follow-up Information    Schedule an appointment as soon as possible for a visit with Jodi MarbleHOMPSON, DAVID A., MD.   Specialty:  Orthopedic Surgery   Why:  Thursday 04-04-15   Contact information:   [address] [place] [address] [phone]       Discharge Instructions: Discharge Instructions    Diet - low sodium heart healthy     Complete by:  As directed      Increase activity slowly    Complete by:  As directed            Consultations:    Procedures Performed:  Dg Hand Complete Left  03/28/2015  CLINICAL DATA:  69 year old female with laceration of the left hand 10 days ago. Progressive pain and swelling proximal metacarpal area. Initial encounter. EXAM: LEFT HAND - COMPLETE 3+ VIEW COMPARISON:  None. FINDINGS: Soft tissue swelling apparent over the dorsal second metatarsal. No subcutaneous gas identified. No radiopaque foreign body identified. Distal radius and ulna intact. Carpal bone alignment within normal limits. Osteopenia. Metacarpals intact. Phalanges intact. Distal joint space osteoarthritis. IMPRESSION: Soft tissue swelling about the dorsal second metacarpal. No acute osseous abnormality identified. No subcutaneous gas or retained radiopaque foreign body. Electronically Signed   By: Odessa FlemingH  Hall M.D.   On: 03/28/2015 10:55   Admission HPI: Kimberly Noble is a 69yo F with PMH generalized anxiety disorder with agoraphobia, major depressive disorder who presents with a thumb laceration with drainage. Two weeks ago, she was cutting an onion with a kitchen knife and sliced her L thumb. She went to an urgent care where the laceration was stitched up and  given a tetanus booster. She noted that 'her fingers were turning blue', the area around the cut was red and she could express whitish/yellowish drainage from the cut so she returned to the urgent care 5 days ago, where the stiches were removed and she was given doxycycline. She then presented to the ED today because the wound wasn't getting any better and there was still purulent drainage. Throughout this episode, she denies fevers, chills, myalgias, hand weakness or numbness. She does feel like her hand is more swollen than the unaffected hand and it is more difficult to close currently. She denies shortness of breath, chest pain, abdominal pain, nausea/vomiting, diarrhea.  She does say that since yesterday, she has had been unable to void although she has tried. She denies other symptoms at this time.  Hospital Course by problem list: Active Problems:   Cellulitis   Cellulitis of left hand   1. Thumb laceration - patient presented with above symptoms, was started on clindamycin IV due to doxycycline 'failure' although did not look particularly infected at the time of presentation. Hand surgery was consulted, who helped address wound care issues and recommended oral clindamycin x 5 days and provided wound care recs for the patient. She was discharged the next day with arrangement for follow-up in the hand surgery clinic for re-evaluation of healing.  Discharge Vitals:   BP 111/60 mmHg  Pulse 77  Temp(Src) 98.1 F (36.7 C) (Oral)  Resp 19  Ht  (1.499 m)  Wt 129 lb 8 oz (58.741 kg)  BMI 26.14 kg/m2  SpO2 94%  Discharge Labs:  No results found for this or any previous visit (from the past 24 hour(s)).  Signed: Darrick Huntsman, MD 03/30/2015, 12:37 PM

## 2015-03-30 NOTE — H&P (Deleted)
Name: Kimberly Noble MRN: [id number] DOB: August 05, 1945 69 y.o. PCP: No Pcp Per Patient  Date of Admission: 03/28/2015  9:50 AM Date of Discharge: 03/30/2015 Attending Physician: No att. providers found  Discharge Diagnosis: 1. Thumb laceration  Active Problems:   Cellulitis   Cellulitis of left hand  Discharge Medications:   Medication List    STOP taking these medications        doxycycline 100 MG capsule  Commonly known as:  VIBRAMYCIN      TAKE these medications        ALPRAZolam 1 MG tablet  Commonly known as:  XANAX  Take 1 mg by mouth 2 (two) times daily. Anxiety     calcium-vitamin D 500-200 MG-UNIT tablet  Commonly known as:  OSCAL WITH D  Take 1 tablet by mouth daily with breakfast.     clindamycin 300 MG capsule  Commonly known as:  CLEOCIN  Take 1 capsule (300 mg total) by mouth 4 (four) times daily.     diazepam 10 MG tablet  Commonly known as:  VALIUM  Take 10 mg by mouth 2 (two) times daily.     multivitamin with minerals tablet  Take 1 tablet by mouth daily.     vitamin C 500 MG tablet  Commonly known as:  ASCORBIC ACID  Take 500 mg by mouth daily.     vitamin E 100 UNIT capsule  Take 100 Units by mouth daily.        Disposition and follow-up:   Kimberly Noble was discharged from [facility] in Good condition.  At the hospital follow up visit please address:  1.  Follow-up with hand surgery, completion of antibiotics, laceration healing  2.  Labs / imaging needed at time of follow-up: None  3.  Pending labs/ test needing follow-up: None  Follow-up Appointments:     Follow-up Information    Schedule an appointment as soon as possible for a visit with Jodi Marble., MD.   Specialty:  Orthopedic Surgery   Why:  Thursday 04-04-15   Contact information:   [address] [place] [address] [phone]       Discharge Instructions: Discharge Instructions    Diet - low sodium heart healthy     Complete by:  As directed      Increase activity slowly    Complete by:  As directed            Consultations:    Procedures Performed:  Dg Hand Complete Left  03/28/2015  CLINICAL DATA:  68 year old female with laceration of the left hand 10 days ago. Progressive pain and swelling proximal metacarpal area. Initial encounter. EXAM: LEFT HAND - COMPLETE 3+ VIEW COMPARISON:  None. FINDINGS: Soft tissue swelling apparent over the dorsal second metatarsal. No subcutaneous gas identified. No radiopaque foreign body identified. Distal radius and ulna intact. Carpal bone alignment within normal limits. Osteopenia. Metacarpals intact. Phalanges intact. Distal joint space osteoarthritis. IMPRESSION: Soft tissue swelling about the dorsal second metacarpal. No acute osseous abnormality identified. No subcutaneous gas or retained radiopaque foreign body. Electronically Signed   By: Odessa Fleming M.D.   On: 03/28/2015 10:55   Admission HPI: Kimberly Noble is a 69yo F with PMH generalized anxiety disorder with agoraphobia, major depressive disorder who presents with a thumb laceration with drainage. Two weeks ago, she was cutting an onion with a kitchen knife and sliced her L thumb. She went to an urgent care where the laceration  was stitched up and given a tetanus booster. She noted that 'her fingers were turning blue', the area around the cut was red and she could express whitish/yellowish drainage from the cut so she returned to the urgent care 5 days ago, where the stiches were removed and she was given doxycycline. She then presented to the ED today because the wound wasn't getting any better and there was still purulent drainage. Throughout this episode, she denies fevers, chills, myalgias, hand weakness or numbness. She does feel like her hand is more swollen than the unaffected hand and it is more difficult to close currently. She denies shortness of breath, chest pain, abdominal pain, nausea/vomiting, diarrhea.  She does say that since yesterday, she has had been unable to void although she has tried. She denies other symptoms at this time.  Hospital Course by problem list: Active Problems:   Cellulitis   Cellulitis of left hand   1. Thumb laceration - patient presented with above symptoms, was started on clindamycin IV due to doxycycline 'failure' although did not look particularly infected at the time of presentation. Hand surgery was consulted, who helped address wound care issues and recommended oral clindamycin x 5 days and provided wound care recs for the patient. She was discharged the next day with arrangement for follow-up in the hand surgery clinic for re-evaluation of healing.  Discharge Vitals:   BP 111/60 mmHg  Pulse 77  Temp(Src) 98.1 F (36.7 C) (Oral)  Resp 19  Ht 4\' 11"  (1.499 m)  Wt 129 lb 8 oz (58.741 kg)  BMI 26.14 kg/m2  SpO2 94%  Discharge Labs:  No results found for this or any previous visit (from the past 24 hour(s)).  Signed: Darrick HuntsmanWilliam R Katya Rolston, MD 03/30/2015, 12:17 PM

## 2016-04-24 ENCOUNTER — Encounter (HOSPITAL_COMMUNITY): Payer: Self-pay

## 2016-04-24 ENCOUNTER — Observation Stay (HOSPITAL_COMMUNITY)
Admission: EM | Admit: 2016-04-24 | Discharge: 2016-04-25 | Disposition: A | Discharge status: Home / Self Care | Payer: Medicare Other | Attending: Family Medicine | Admitting: Family Medicine

## 2016-04-24 ENCOUNTER — Emergency Department (HOSPITAL_COMMUNITY): Payer: Medicare Other

## 2016-04-24 DIAGNOSIS — F13239 Sedative, hypnotic or anxiolytic dependence with withdrawal, unspecified: Secondary | ICD-10-CM | POA: Diagnosis not present

## 2016-04-24 DIAGNOSIS — F4001 Agoraphobia with panic disorder: Secondary | ICD-10-CM | POA: Diagnosis not present

## 2016-04-24 DIAGNOSIS — F17210 Nicotine dependence, cigarettes, uncomplicated: Secondary | ICD-10-CM | POA: Diagnosis not present

## 2016-04-24 DIAGNOSIS — Z7982 Long term (current) use of aspirin: Secondary | ICD-10-CM | POA: Insufficient documentation

## 2016-04-24 DIAGNOSIS — Z79899 Other long term (current) drug therapy: Secondary | ICD-10-CM | POA: Diagnosis not present

## 2016-04-24 DIAGNOSIS — F410 Panic disorder [episodic paroxysmal anxiety] without agoraphobia: Secondary | ICD-10-CM | POA: Diagnosis present

## 2016-04-24 DIAGNOSIS — F13939 Sedative, hypnotic or anxiolytic use, unspecified with withdrawal, unspecified: Secondary | ICD-10-CM | POA: Diagnosis present

## 2016-04-24 DIAGNOSIS — E860 Dehydration: Secondary | ICD-10-CM | POA: Insufficient documentation

## 2016-04-24 DIAGNOSIS — F411 Generalized anxiety disorder: Secondary | ICD-10-CM | POA: Diagnosis present

## 2016-04-24 DIAGNOSIS — D72829 Elevated white blood cell count, unspecified: Secondary | ICD-10-CM

## 2016-04-24 LAB — COMPREHENSIVE METABOLIC PANEL
ALT: 19 U/L (ref 14–54)
ANION GAP: 16 — AB (ref 5–15)
AST: 24 U/L (ref 15–41)
Albumin: 4.5 g/dL (ref 3.5–5.0)
Alkaline Phosphatase: 60 U/L (ref 38–126)
BILIRUBIN TOTAL: 1.3 mg/dL — AB (ref 0.3–1.2)
BUN: 19 mg/dL (ref 6–20)
CALCIUM: 9.9 mg/dL (ref 8.9–10.3)
CO2: 12 mmol/L — ABNORMAL LOW (ref 22–32)
Chloride: 108 mmol/L (ref 101–111)
Creatinine, Ser: 1.06 mg/dL — ABNORMAL HIGH (ref 0.44–1.00)
GFR calc Af Amer: >60 mL/min (ref >60)
GFR, EST NON AFRICAN AMERICAN: 52 mL/min — AB (ref >60)
Glucose, Bld: 86 mg/dL (ref 65–99)
POTASSIUM: 3.9 mmol/L (ref 3.5–5.1)
Sodium: 136 mmol/L (ref 135–145)
TOTAL PROTEIN: 8.3 g/dL — AB (ref 6.5–8.1)

## 2016-04-24 LAB — URINALYSIS, ROUTINE W REFLEX MICROSCOPIC
Bilirubin Urine: NEGATIVE
Glucose, UA: NEGATIVE mg/dL
Ketones, ur: 80 mg/dL — AB
Leukocytes, UA: NEGATIVE
Nitrite: NEGATIVE
PROTEIN: 100 mg/dL — AB
SPECIFIC GRAVITY, URINE: 1.02 (ref 1.005–1.030)
pH: 5 (ref 5.0–8.0)

## 2016-04-24 LAB — CBC WITH DIFFERENTIAL/PLATELET
Basophils Absolute: 0 10*3/uL (ref 0.0–0.1)
Basophils Relative: 0 %
EOS PCT: 0 %
Eosinophils Absolute: 0.1 10*3/uL (ref 0.0–0.7)
HCT: 48.9 % — ABNORMAL HIGH (ref 36.0–46.0)
Hemoglobin: 16.9 g/dL — ABNORMAL HIGH (ref 12.0–15.0)
LYMPHS ABS: 3.1 10*3/uL (ref 0.7–4.0)
LYMPHS PCT: 19 %
MCH: 30.3 pg (ref 26.0–34.0)
MCHC: 34.6 g/dL (ref 30.0–36.0)
MCV: 87.6 fL (ref 78.0–100.0)
MONO ABS: 1.4 10*3/uL — AB (ref 0.1–1.0)
MONOS PCT: 8 %
Neutro Abs: 12.4 10*3/uL — ABNORMAL HIGH (ref 1.7–7.7)
Neutrophils Relative %: 73 %
PLATELETS: 356 10*3/uL (ref 150–400)
RBC: 5.58 MIL/uL — ABNORMAL HIGH (ref 3.87–5.11)
RDW: 12.8 % (ref 11.5–15.5)
WBC: 17 10*3/uL — ABNORMAL HIGH (ref 4.0–10.5)

## 2016-04-24 LAB — ETHANOL

## 2016-04-24 LAB — RAPID URINE DRUG SCREEN, HOSP PERFORMED
AMPHETAMINES: NOT DETECTED
Barbiturates: NOT DETECTED
Benzodiazepines: POSITIVE — AB
COCAINE: NOT DETECTED
OPIATES: NOT DETECTED
TETRAHYDROCANNABINOL: NOT DETECTED

## 2016-04-24 LAB — I-STAT VENOUS BLOOD GAS, ED
Acid-base deficit: 16 mmol/L — ABNORMAL HIGH (ref 0.0–2.0)
Bicarbonate: 10.7 mmol/L — ABNORMAL LOW (ref 20.0–28.0)
O2 SAT: 57 %
TCO2: 11 mmol/L (ref 0–100)
pCO2, Ven: 27.1 mmHg — ABNORMAL LOW (ref 44.0–60.0)
pH, Ven: 7.204 — ABNORMAL LOW (ref 7.250–7.430)
pO2, Ven: 36 mmHg (ref 32.0–45.0)

## 2016-04-24 LAB — LACTIC ACID, PLASMA
LACTIC ACID, VENOUS: 1.2 mmol/L (ref 0.5–1.9)
Lactic Acid, Venous: 1.5 mmol/L (ref 0.5–1.9)

## 2016-04-24 LAB — TROPONIN I: Troponin I: <0.03 ng/mL (ref <0.03)

## 2016-04-24 LAB — TSH: TSH: 1.527 u[IU]/mL (ref 0.350–4.500)

## 2016-04-24 LAB — OSMOLALITY: OSMOLALITY: 293 mosm/kg (ref 275–295)

## 2016-04-24 LAB — AMMONIA: AMMONIA: 22 umol/L (ref 9–35)

## 2016-04-24 LAB — ACETAMINOPHEN LEVEL: Acetaminophen (Tylenol), Serum: <10 ug/mL — ABNORMAL LOW (ref 10–30)

## 2016-04-24 LAB — SALICYLATE LEVEL: Salicylate Lvl: <7 mg/dL (ref 2.8–30.0)

## 2016-04-24 MED ORDER — SODIUM CHLORIDE 0.9 % IV SOLN
1000.0000 mL | INTRAVENOUS | Status: DC
  Administered 2016-04-24: 1000 mL via INTRAVENOUS

## 2016-04-24 MED ORDER — ENOXAPARIN SODIUM 30 MG/0.3ML ~~LOC~~ SOLN
30.0000 mg | SUBCUTANEOUS | Status: DC
  Administered 2016-04-24: 30 mg via SUBCUTANEOUS
  Filled 2016-04-24: qty 0.3

## 2016-04-24 MED ORDER — THIAMINE HCL 100 MG/ML IJ SOLN: 100.0000 mg | Freq: Every day | INTRAMUSCULAR | Status: DC

## 2016-04-24 MED ORDER — ALPRAZOLAM 0.25 MG PO TABS
1.0000 mg | ORAL_TABLET | Freq: Once | ORAL | Status: AC
  Administered 2016-04-24: 1 mg via ORAL
  Filled 2016-04-24: qty 4

## 2016-04-24 MED ORDER — SODIUM CHLORIDE 0.9 % IV SOLN
INTRAVENOUS | Status: DC
  Administered 2016-04-24: 19:00:00 via INTRAVENOUS

## 2016-04-24 MED ORDER — LORAZEPAM 1 MG PO TABS
0.0000 mg | ORAL_TABLET | Freq: Four times a day (QID) | ORAL | Status: DC
  Filled 2016-04-24: qty 1

## 2016-04-24 MED ORDER — ADULT MULTIVITAMIN W/MINERALS CH
1.0000 | ORAL_TABLET | Freq: Every day | ORAL | Status: DC
  Administered 2016-04-24 – 2016-04-25 (×2): 1 via ORAL
  Filled 2016-04-24 (×2): qty 1

## 2016-04-24 MED ORDER — ONDANSETRON HCL 4 MG/2ML IJ SOLN: 4.0000 mg | Freq: Three times a day (TID) | INTRAMUSCULAR | Status: DC | PRN

## 2016-04-24 MED ORDER — LORAZEPAM 1 MG PO TABS
1.0000 mg | ORAL_TABLET | Freq: Four times a day (QID) | ORAL | Status: DC | PRN
  Administered 2016-04-25: 1 mg via ORAL

## 2016-04-24 MED ORDER — FOLIC ACID 1 MG PO TABS
1.0000 mg | ORAL_TABLET | Freq: Every day | ORAL | Status: DC
  Administered 2016-04-24 – 2016-04-25 (×2): 1 mg via ORAL
  Filled 2016-04-24 (×2): qty 1

## 2016-04-24 MED ORDER — LORAZEPAM 1 MG PO TABS: 0.0000 mg | ORAL_TABLET | Freq: Two times a day (BID) | ORAL | Status: DC

## 2016-04-24 MED ORDER — ONDANSETRON HCL 4 MG/2ML IJ SOLN
4.0000 mg | Freq: Three times a day (TID) | INTRAMUSCULAR | Status: DC | PRN
Start: 2016-04-24 — End: 2016-04-24

## 2016-04-24 MED ORDER — SODIUM CHLORIDE 0.9% FLUSH
3.0000 mL | Freq: Two times a day (BID) | INTRAVENOUS | Status: DC
  Administered 2016-04-25: 3 mL via INTRAVENOUS

## 2016-04-24 MED ORDER — ENOXAPARIN SODIUM 40 MG/0.4ML ~~LOC~~ SOLN: 40.0000 mg | SUBCUTANEOUS | Status: DC

## 2016-04-24 MED ORDER — SODIUM CHLORIDE 0.9 % IV SOLN
1000.0000 mL | Freq: Once | INTRAVENOUS | Status: AC
  Administered 2016-04-24: 1000 mL via INTRAVENOUS

## 2016-04-24 MED ORDER — LORAZEPAM 2 MG/ML IJ SOLN: 1.0000 mg | Freq: Four times a day (QID) | INTRAMUSCULAR | Status: DC | PRN

## 2016-04-24 MED ORDER — DEXTROSE-NACL 5-0.45 % IV SOLN
INTRAVENOUS | Status: DC
  Filled 2016-04-24 (×2): qty 1000

## 2016-04-24 MED ORDER — VITAMIN B-1 100 MG PO TABS
100.0000 mg | ORAL_TABLET | Freq: Every day | ORAL | Status: DC
  Administered 2016-04-24 – 2016-04-25 (×2): 100 mg via ORAL
  Filled 2016-04-24 (×2): qty 1

## 2016-04-24 MED ORDER — ENSURE ENLIVE PO LIQD
237.0000 mL | Freq: Two times a day (BID) | ORAL | Status: DC
  Administered 2016-04-25 (×2): 237 mL via ORAL

## 2016-04-24 NOTE — H&P (Signed)
[facility] Admission History and Physical Service Pager: [phone]  Patient name: Kimberly Noble Medical record number: [id number] Date of birth: 05-23-1945 Age: 71 y.o. Gender: female  Primary Care Provider: No PCP Per Patient Consultants: Psych Code Status: Full (obtained on admission)  Chief Complaint: Dizziness and nausea  Assessment and Plan: Kimberly Noble is a 71 y.o. female with PMH significant for agoraphobia, generalized anxiety, panic disorder, and depression who presents with dizziness and nausea.    Presyncope:  Likely due to dehydration given history of poor by mouth intake and nausea and vomiting. She also endorses palpitations which could be from dehydration or arrhythmia. However, her vital signs and EKG are basically normal in ED except for mild tachycardia to 118 on arrival. I'm also wondering if her psychiatric history or substance use or withdrawal from substances is playing into this. I also can't rule out Meniere Disease, BPPV, cerebellar stroke, labyrinthitis, and vestibular neuritis given history of tinnitus, nausea and vomiting although her dizziness is not vertiginous. A cerebellar stroke is also on the differential but unlikely given her unremarkable neuro exam and negative head CT. Patient is on multiple supplements at home which could be contributing to this as well. Her VBG is remarkable for metabolic acidosis with mild anion gap.  - Admit to MedSurg for observation. Attending Dr. Andria Frames - Reassess in the morning.  - Cardiac monitor - UDS - UA - am BMP.  - Zofran 4 mg IV Q8H PRN - Encourage oral intake - CIWA  Anion Gap Metabolic Acidosis: Patient is on multiple vitamin/mineral supplements on home. Starvation is another possibility in the setting of poor by mouth intake for about a week. However, blood glucose was 86 on CMP. Other common causes are  methanol, uremia, DKA, paraldehyde, iron/isoniazid, ethanol/ethylene glycol,  lactic acidosis and salicylates/ASA/aspirin  which are unlikely based on history and tests we have done so far. Iron could be a possibility given her history of use of a lot of vitamins and supplements. Borderline Osmolal gap concerning for indigestion although she denies. She is status post 2 L NS bolus in ED  - BMP - Carboxyhemoglobin  - UA and UDS as above.   Leukocytosis: likely stress-induced than infection. There is some component of dehydration as well  -Monitor CBC   Depression/Agoraphobia/Anxiety:  chronic issue. Appears to have impaired her life so much. She states that "her home is her world" and she has not seen her psychiatrist to get a refill of her prescription 2/2 panic attacks. Appears to have some pressure and increased volume of speech. She denies suicidal or homicidal ideation.  - S/p 1 mg alprazolam in the ED. - Started on CIWA protocol with lorazepam scheduled and PRN.  - Folic acid 1 mg PO QD and thiamine 100 mg PO QD.  - Consider psych consult in the morning  Tobacco Abuse: Smoking history is approximately 1/2 PPD for 20 years and currently smokes about 8 cigarettes daily for her nerves.  - Nicotine patch PRN.   FEN/GI:  -No sign of dehydration -KVO given acidosis  -Encouraged by mouth intake -Regular diet as tolerated.  -Zofran for nausea/vomiting  Prophylaxis: Lovenox.   Disposition: Observation, [place].   History of Present Illness:  Kimberly Noble is a 71 y.o. female with PMH significant for agoraphobia, generalized anxiety, panic disorder, and depression. She states that she has been taking xanax 1 mg TID for several years, but ran out of this medication about 8 days ago.  4 days after she ran out of that medication she developed nausea, vomiting, heightened sense of smell and sound, metallic taste, palpitations, decreased concentration and tinnitus. In addition, she also had some vomiting, approximately 5 episodes on the first day, then 1 episode the next  followed by resolution of that up until now. She did not have any bloody emesis. She has not had anything to eat since 4 days ago per her report and whenever she tries to drink water or gatorade she gets "a gag reflex." In regards to her dizzienss she states that moving her head makes this worse. Describes the dizziness as "felt like passing out". She also reports falling forward on her cat's bed but denies LOC. She denies any vision changes, headache, or recent cold like symptoms. She states that her symptoms have been unchanged in severity since they first began. She also denies any blood in her stool, abdominal pain or diarrhea or dark/tarry stools. No urinary pain and her last bowel movement was 5 days ago. She also denies any thoughts of hurting herself or others and states she has not ingested any poisons or overdosed on any medications. Also of note, she states that she takes a combination of about 20 vitamins and supplements.   In the ED UA, urine drug screen, CBC, ethanol levels, salicylate level and acetaminophen levels were drawn as well as CMP, troponin, TSH, VBG, ammonia, serum osmolality, and lactic acid were drawn. Abnormalities include WBC of 17, RBC 5.58, hemoglobin 16.9, Hct 48.9, ANC 12.4, monocyte of 1.4, decreased CMP CO2, increased total protein, increased total bilirubin, anion gap of 16. Her VBG showed a metabolic acidosis. CT head was normal.   Review Of Systems: Per HPI with the following additions:   Review of Systems  Constitutional: Negative for chills and fever.  HENT: Negative for congestion and sore throat.   Eyes: Negative for blurred vision.  Respiratory: Negative for cough and shortness of breath.   Cardiovascular: Positive for palpitations. Negative for chest pain and leg swelling.  Gastrointestinal: Positive for nausea and vomiting. Negative for abdominal pain.  Genitourinary: Negative for dysuria.  Musculoskeletal: Negative for myalgias.  Skin: Negative for rash.   Neurological: Positive for dizziness. Negative for sensory change, speech change, focal weakness, loss of consciousness and headaches.  Endo/Heme/Allergies: Negative for environmental allergies. Does not bruise/bleed easily.  Psychiatric/Behavioral: Negative for substance abuse and suicidal ideas.    Patient Active Problem List   Diagnosis Date Noted  . Dehydration 04/24/2016  . Cellulitis 03/28/2015  . Cellulitis of left hand   . HYPERLIPIDEMIA, TYPE II 04/19/2008  . CERUMEN IMPACTION, BILATERAL 04/19/2008  . OSTEOPOROSIS 04/19/2008  . ANXIETY 12/24/2006  . DEPRESSION 12/24/2006    Past Medical History: Past Medical History:  Diagnosis Date  . Agoraphobia with panic attacks   . Anxiety attack   . Major depression   . Superficial laceration of hand ~ 03/2015   left thumb    Past Surgical History: Past Surgical History:  Procedure Laterality Date  . TONSILLECTOMY  1950's    Social History: Social History  Substance Use Topics  . Smoking status: Current Some Day Smoker    Packs/day: 0.33    Years: 40.00    Types: Cigarettes  . Smokeless tobacco: Never Used  . Alcohol use No   Additional social history: She lives with her husband and their cats. She does not currently or has ever used any illicit drugs such as marijuana, cocaine or heroin.  Please also refer  to relevant sections of EMR.  Family History: She states that her mother and father were healthy and that her mother only had Alzheimer disease. No history of HTN, HLD, DM. No children.   Allergies and Medications: Allergies  Allergen Reactions  . Penicillins Rash    Has patient had a PCN reaction causing immediate rash, facial/tongue/throat swelling, SOB or lightheadedness with hypotension: Yes Has patient had a PCN reaction causing severe rash involving mucus membranes or skin necrosis: No Has patient had a PCN reaction that required hospitalization No Has patient had a PCN reaction occurring within the  last 10 years: No If all of the above answers are "NO", then may proceed with Cephalosporin use.    No current facility-administered medications on file prior to encounter.    Current Outpatient Prescriptions on File Prior to Encounter  Medication Sig Dispense Refill  . calcium-vitamin D (OSCAL WITH D) 500-200 MG-UNIT tablet Take 1 tablet by mouth daily.     . Multiple Vitamins-Minerals (MULTIVITAMIN WITH MINERALS) tablet Take 1 tablet by mouth daily.    . vitamin C (ASCORBIC ACID) 500 MG tablet Take 500 mg by mouth daily.    . vitamin E 100 UNIT capsule Take 100 Units by mouth daily.    Marland Kitchen ALPRAZolam (XANAX) 1 MG tablet Take 1 mg by mouth daily.       Objective: BP 142/64   Pulse 85   Temp 98.8 F (37.1 C) (Oral)   Resp 17   Ht 4\' 11"  (1.499 m)   Wt 56.7 kg (125 lb)   SpO2 99%   BMI 25.25 kg/m  Exam: General: Patient is laying in bed with the lights dimmed, wearing sunglasses but does not appear in acute distress. Able to sit in bed with ease for exam. Eyes: PERRL. EOMI, no nystagmus  ENTM: Dentition is poor. Neck: Neck is supple. Cardiovascular: Heart has a regular rate and rhythm without any murmurs or rubs, cap refills < 2 sec Respiratory: no increased work of breathing without any wheezes, crackles or rhonchi.  Gastrointestinal: Normal, active bowel sounds. Abdomen is soft, non-tender and non-distended. MSK: Muscle tone and bulk is normal.  Derm: No skin lesions noted.  Neuro: Alert and oriented appropriately. CN 2-12 intact. Strength 5/5 in all muscles groups of upper and lower extremities bilaterally.  Finger-to-nose intact Psych: She has an appropriate mood and affect, but has pressured speech during conversation.  Labs and Imaging: Recent Results (from the past 2160 hour(s))  Comprehensive metabolic panel     Status: Abnormal   Collection Time: 04/24/16  2:30 PM  Result Value Ref Range   Sodium 136 135 - 145 mmol/L   Potassium 3.9 3.5 - 5.1 mmol/L   Chloride 108  101 - 111 mmol/L   CO2 12 (L) 22 - 32 mmol/L   Glucose, Bld 86 65 - 99 mg/dL   BUN 19 6 - 20 mg/dL   Creatinine, Ser 06/22/16 (H) 0.44 - 1.00 mg/dL   Calcium 9.9 8.9 - 3.15 mg/dL   Total Protein 8.3 (H) 6.5 - 8.1 g/dL   Albumin 4.5 3.5 - 5.0 g/dL   AST 24 15 - 41 U/L   ALT 19 14 - 54 U/L   Alkaline Phosphatase 60 38 - 126 U/L   Total Bilirubin 1.3 (H) 0.3 - 1.2 mg/dL   GFR calc non Af Amer 52 (L) >60 mL/min   GFR calc Af Amer >60 >60 mL/min    Comment: (NOTE) The eGFR has been calculated using  the CKD EPI equation. This calculation has not been validated in all clinical situations. eGFR's persistently <60 mL/min signify possible Chronic Kidney Disease.    Anion gap 16 (H) 5 - 15  Ethanol     Status: None   Collection Time: 04/24/16  2:30 PM  Result Value Ref Range   Alcohol, Ethyl (B) <5 <5 mg/dL    Comment:        LOWEST DETECTABLE LIMIT FOR SERUM ALCOHOL IS 5 mg/dL FOR MEDICAL PURPOSES ONLY   Acetaminophen level     Status: Abnormal   Collection Time: 04/24/16  2:30 PM  Result Value Ref Range   Acetaminophen (Tylenol), Serum <10 (L) 10 - 30 ug/mL    Comment:        THERAPEUTIC CONCENTRATIONS VARY SIGNIFICANTLY. A RANGE OF 10-30 ug/mL MAY BE AN EFFECTIVE CONCENTRATION FOR MANY PATIENTS. HOWEVER, SOME ARE BEST TREATED AT CONCENTRATIONS OUTSIDE THIS RANGE. ACETAMINOPHEN CONCENTRATIONS >150 ug/mL AT 4 HOURS AFTER INGESTION AND >50 ug/mL AT 12 HOURS AFTER INGESTION ARE OFTEN ASSOCIATED WITH TOXIC REACTIONS.   Salicylate level     Status: None   Collection Time: 04/24/16  2:30 PM  Result Value Ref Range   Salicylate Lvl <6.1 2.8 - 30.0 mg/dL  Troponin I     Status: None   Collection Time: 04/24/16  2:30 PM  Result Value Ref Range   Troponin I <0.03 <0.03 ng/mL  CBC with Differential     Status: Abnormal   Collection Time: 04/24/16  2:30 PM  Result Value Ref Range   WBC 17.0 (H) 4.0 - 10.5 K/uL   RBC 5.58 (H) 3.87 - 5.11 MIL/uL   Hemoglobin 16.9 (H) 12.0 - 15.0  g/dL   HCT 48.9 (H) 36.0 - 46.0 %   MCV 87.6 78.0 - 100.0 fL   MCH 30.3 26.0 - 34.0 pg   MCHC 34.6 30.0 - 36.0 g/dL   RDW 12.8 11.5 - 15.5 %   Platelets 356 150 - 400 K/uL   Neutrophils Relative % 73 %   Neutro Abs 12.4 (H) 1.7 - 7.7 K/uL   Lymphocytes Relative 19 %   Lymphs Abs 3.1 0.7 - 4.0 K/uL   Monocytes Relative 8 %   Monocytes Absolute 1.4 (H) 0.1 - 1.0 K/uL   Eosinophils Relative 0 %   Eosinophils Absolute 0.1 0.0 - 0.7 K/uL   Basophils Relative 0 %   Basophils Absolute 0.0 0.0 - 0.1 K/uL  TSH     Status: None   Collection Time: 04/24/16  2:30 PM  Result Value Ref Range   TSH 1.527 0.350 - 4.500 uIU/mL    Comment: Performed by a 3rd Generation assay with a functional sensitivity of <=0.01 uIU/mL.  I-Stat venous blood gas, ED     Status: Abnormal   Collection Time: 04/24/16  4:09 PM  Result Value Ref Range   pH, Ven 7.204 (L) 7.250 - 7.430   pCO2, Ven 27.1 (L) 44.0 - 60.0 mmHg   pO2, Ven 36.0 32.0 - 45.0 mmHg   Bicarbonate 10.7 (L) 20.0 - 28.0 mmol/L   TCO2 11 0 - 100 mmol/L   O2 Saturation 57.0 %   Acid-base deficit 16.0 (H) 0.0 - 2.0 mmol/L   Patient temperature HIDE    Sample type VENOUS    Comment NOTIFIED PHYSICIAN   Osmolality     Status: None   Collection Time: 04/24/16  4:16 PM  Result Value Ref Range   Osmolality 293 275 - 295 mOsm/kg  Ammonia     Status: None   Collection Time: 04/24/16  4:17 PM  Result Value Ref Range   Ammonia 22 9 - 35 umol/L  Lactic acid, plasma     Status: None   Collection Time: 04/24/16  5:19 PM  Result Value Ref Range   Lactic Acid, Venous 1.2 0.5 - 1.9 mmol/L   CT Head without Contrast: IMPRESSION: Normal head CT.  Jacklynn Ganong, MS4 04/24/2016, 5:29 PM [facility] Intern pager: [phone], text pages welcome  I have seen and evaluated the patient with Student Dr. Trilby Drummer. I am in agreement with the note above in its revised form.  Wendee Beavers, MD.  PGY-2, [facility] 04/24/16  8:04  PM

## 2016-04-24 NOTE — ED Notes (Signed)
Patient transported to CT 

## 2016-04-24 NOTE — ED Triage Notes (Signed)
Per [facility], Pt is coming from home with complaints of hyperventilating and panic attack. Pt has Hx of the same and has been diagnosed with OCD and Panic attack. Pt reports running out of medications. Pt was found sitting on the floor by the paramedics. EMS reports no neuro deficits, no falls, no injury. Pt complains of nausea. Vitals per EMS: 117 HR, 128/89, 96% CBG 81

## 2016-04-24 NOTE — ED Notes (Signed)
Hospitalists at pt's bedside. 

## 2016-04-24 NOTE — ED Provider Notes (Signed)
[facility]-EMERGENCY DEPT Provider Note   CSN: [id number] Arrival date & time: 04/24/16  1129     History   Chief Complaint Chief Complaint  Patient presents with  . Panic Attack    HPI Kimberly Noble is a 71 y.o. female.  HPI Patient reports that she started having repeated vomiting on Monday. This went on for about 24 hours and then seemed to slow down. She reports after that however she really hasn't been able to eat anything. She reports that she eats or drinks it just seems to not go down and she has had no appetite. She did not develop any fever. Patient denies chest pain or abdominal pain. She reports that she was having a lot of lightheadedness and dizziness as well. Dizziness had spinning quality to it. No associated headache. No focal weakness numbness or tingling.Patient also reports photophobia which is chronic "she reports she wear sunglasses in the rain", also hyperacusis and metallic taste in her mouth. The patient reports the only thing she can really identify as significantly changing, is that she ran out of her Xanax and thinks she might of develop withdrawal. She reports she had been on Xanax up to 2 mg 4 times a day for many years. (She reports having taken benzodiazepines for over 20 years). She reports that she stopped going to her psychiatrist and basically has just stayed in her home due to agoraphobia. She was not able to get her prescriptions refilled but she reports she had an older prescription that she had been carefully spreading out but finally she ran out altogether about one week ago. Patient reports that she has not had any actual medical care for many many years. She reports she's had multiple psychiatric admissions and treatment but always thought she was very healthy except for that and never has medical problems. Patient reports that she know she should've had a family doctor but she really hates leaving her home and she is "always been healthy". Patient denies  any alcohol use. No other drug use. Past Medical History:  Diagnosis Date  . Agoraphobia with panic attacks   . Anxiety attack   . Major depression   . Superficial laceration of hand ~ 03/2015   left thumb    Patient Active Problem List   Diagnosis Date Noted  . Cellulitis 03/28/2015  . Cellulitis of left hand   . HYPERLIPIDEMIA, TYPE II 04/19/2008  . CERUMEN IMPACTION, BILATERAL 04/19/2008  . OSTEOPOROSIS 04/19/2008  . ANXIETY 12/24/2006  . DEPRESSION 12/24/2006    Past Surgical History:  Procedure Laterality Date  . TONSILLECTOMY  1950's    OB History    No data available       Home Medications    Prior to Admission medications   Medication Sig Start Date End Date Taking? Authorizing Provider  aspirin EC 81 MG tablet Take 81 mg by mouth daily.   Yes Historical Provider, MD  calcium-vitamin D (OSCAL WITH D) 500-200 MG-UNIT tablet Take 1 tablet by mouth daily with breakfast.   Yes Historical Provider, MD  Multiple Vitamins-Minerals (MULTIVITAMIN WITH MINERALS) tablet Take 1 tablet by mouth daily.   Yes Historical Provider, MD  vitamin C (ASCORBIC ACID) 500 MG tablet Take 500 mg by mouth daily.   Yes Historical Provider, MD  vitamin E 100 UNIT capsule Take 100 Units by mouth daily.   Yes Historical Provider, MD  ALPRAZolam Prudy Feeler) 1 MG tablet Take 1 mg by mouth daily. Anxiety     Historical  Provider, MD  clindamycin (CLEOCIN) 300 MG capsule Take 1 capsule (300 mg total) by mouth 4 (four) times daily. Patient not taking: Reported on 04/24/2016 03/29/15   Darrick Huntsman, MD    Family History No family history on file.  Social History Social History  Substance Use Topics  . Smoking status: Current Some Day Smoker    Packs/day: 0.33    Years: 40.00    Types: Cigarettes  . Smokeless tobacco: Never Used  . Alcohol use No     Allergies   Penicillins   Review of Systems Review of Systems 10 Systems reviewed and are negative for acute change except as noted  in the HPI.   Physical Exam Updated Vital Signs BP 117/83   Pulse 81   Temp 98.8 F (37.1 C) (Oral)   Resp 17   Ht 4\' 11"  (1.499 m)   Wt 125 lb (56.7 kg)   SpO2 98%   BMI 25.25 kg/m   Physical Exam  Constitutional: She is oriented to person, place, and time. She appears well-developed and well-nourished. No distress.  HENT:  Head: Normocephalic and atraumatic.  Nose: Nose normal.  Mouth/Throat: Oropharynx is clear and moist.  Eyes: Conjunctivae and EOM are normal. Pupils are equal, round, and reactive to light.  Neck: Neck supple.  Cardiovascular: Normal rate and regular rhythm.   No murmur heard. Pulmonary/Chest: Effort normal and breath sounds normal. No respiratory distress.  Abdominal: Soft. She exhibits no distension. There is no tenderness. There is no guarding.  Musculoskeletal: Normal range of motion. She exhibits no edema or tenderness.  Neurological: She is alert and oriented to person, place, and time. She exhibits normal muscle tone. Coordination normal.  Skin: Skin is warm and dry.  Psychiatric: She has a normal mood and affect.  Nursing note and vitals reviewed.    ED Treatments / Results  Labs (all labs ordered are listed, but only abnormal results are displayed) Labs Reviewed  COMPREHENSIVE METABOLIC PANEL - Abnormal; Notable for the following:       Result Value   CO2 12 (*)    Creatinine, Ser 1.06 (*)    Total Protein 8.3 (*)    Total Bilirubin 1.3 (*)    GFR calc non Af Amer 52 (*)    Anion gap 16 (*)    All other components within normal limits  ACETAMINOPHEN LEVEL - Abnormal; Notable for the following:    Acetaminophen (Tylenol), Serum <10 (*)    All other components within normal limits  CBC WITH DIFFERENTIAL/PLATELET - Abnormal; Notable for the following:    WBC 17.0 (*)    RBC 5.58 (*)    Hemoglobin 16.9 (*)    HCT 48.9 (*)    Neutro Abs 12.4 (*)    Monocytes Absolute 1.4 (*)    All other components within normal limits  ETHANOL    SALICYLATE LEVEL  TROPONIN I  TSH  URINALYSIS, ROUTINE W REFLEX MICROSCOPIC  RAPID URINE DRUG SCREEN, HOSP PERFORMED  BLOOD GAS, VENOUS  AMMONIA  OSMOLALITY    EKG  EKG Interpretation None       Radiology Ct Head Wo Contrast  Result Date: 04/24/2016 CLINICAL DATA:  Dizziness. EXAM: CT HEAD WITHOUT CONTRAST TECHNIQUE: Contiguous axial images were obtained from the base of the skull through the vertex without intravenous contrast. COMPARISON:  None. FINDINGS: Brain: The ventricles are normal in size and configuration. No extra-axial fluid collections are identified. The gray-white differentiation is normal. No CT findings for  acute intracranial process such as hemorrhage or infarction. No mass lesions. The brainstem and cerebellum are grossly normal. Vascular: No significant vascular calcifications, hyperdense vessels or aneurysm. Skull: No skull fracture or bone lesions. Sinuses/Orbits: The paranasal sinuses and mastoid air cells are clear. The globes are intact. Other: No scalp lesion or hematoma. IMPRESSION: Normal head CT. Electronically Signed   By: Rudie MeyerP.  Gallerani M.D.   On: 04/24/2016 14:30    Procedures Procedures (including critical care time)  Medications Ordered in ED Medications  0.9 %  sodium chloride infusion (not administered)    Followed by  0.9 %  sodium chloride infusion (not administered)  ALPRAZolam (XANAX) tablet 1 mg (1 mg Oral Given 04/24/16 1513)     Initial Impression / Assessment and Plan / ED Course  I have reviewed the triage vital signs and the nursing notes.  Pertinent labs & imaging results that were available during my care of the patient were reviewed by me and considered in my medical decision making (see chart for details).  Clinical Course    Consult: Family practice resident excepting for Dr. Clarene CritchleyHanzel. Final Clinical Impressions(s) / ED Diagnoses   Final diagnoses:  Benzodiazepine withdrawal with complication (HCC)  Dehydration   Leukocytosis, unspecified type  Patient presents with vomiting earlier the week and then subsequent poor oral intake. She does appear to have dehydration. As well there is leukocytosis. It is possible she developed viral gastroenteritis. She however also has recently run out of benzodiazepine. She has had long-term treatment for chronic anxiety with benzodiazepine. Patient does not show active delirium. She is alert and interactive. The patient's symptoms may be multifactorial combining acute illness on some degree of benzodiazepine withdrawal. Other potential etiologies of anion gap metabolic acidosis to be further evaluated by admitting team as indicated.  New Prescriptions New Prescriptions   No medications on file     Arby BarretteMarcy Carlosdaniel Grob, MD 04/27/16 1534

## 2016-04-25 DIAGNOSIS — F411 Generalized anxiety disorder: Secondary | ICD-10-CM | POA: Diagnosis not present

## 2016-04-25 DIAGNOSIS — E860 Dehydration: Secondary | ICD-10-CM | POA: Diagnosis not present

## 2016-04-25 DIAGNOSIS — F13239 Sedative, hypnotic or anxiolytic dependence with withdrawal, unspecified: Secondary | ICD-10-CM | POA: Diagnosis not present

## 2016-04-25 DIAGNOSIS — F13939 Sedative, hypnotic or anxiolytic use, unspecified with withdrawal, unspecified: Secondary | ICD-10-CM

## 2016-04-25 LAB — BASIC METABOLIC PANEL
ANION GAP: 8 (ref 5–15)
BUN: 11 mg/dL (ref 6–20)
CALCIUM: 8.4 mg/dL — AB (ref 8.9–10.3)
CO2: 15 mmol/L — ABNORMAL LOW (ref 22–32)
Chloride: 114 mmol/L — ABNORMAL HIGH (ref 101–111)
Creatinine, Ser: 0.78 mg/dL (ref 0.44–1.00)
GFR calc non Af Amer: >60 mL/min (ref >60)
GLUCOSE: 130 mg/dL — AB (ref 65–99)
Potassium: 3.2 mmol/L — ABNORMAL LOW (ref 3.5–5.1)
Sodium: 137 mmol/L (ref 135–145)

## 2016-04-25 LAB — PHOSPHORUS: Phosphorus: 2.2 mg/dL — ABNORMAL LOW (ref 2.5–4.6)

## 2016-04-25 LAB — MAGNESIUM: MAGNESIUM: 2.1 mg/dL (ref 1.7–2.4)

## 2016-04-25 MED ORDER — ALPRAZOLAM 1 MG PO TABS: 1.0000 mg | ORAL_TABLET | Freq: Every day | ORAL | 0 refills | Status: DC

## 2016-04-25 MED ORDER — SODIUM PHOSPHATES 45 MMOLE/15ML IV SOLN
30.0000 mmol | Freq: Once | INTRAVENOUS | Status: AC
  Administered 2016-04-25: 30 mmol via INTRAVENOUS
  Filled 2016-04-25: qty 10

## 2016-04-25 MED ORDER — SODIUM CHLORIDE 0.9 % IV SOLN
INTRAVENOUS | Status: DC
  Administered 2016-04-25: 09:00:00 via INTRAVENOUS

## 2016-04-25 MED ORDER — DEXTROSE-NACL 5-0.45 % IV SOLN
INTRAVENOUS | Status: DC
  Administered 2016-04-25: 01:00:00 via INTRAVENOUS

## 2016-04-25 MED ORDER — POTASSIUM CHLORIDE 20 MEQ PO PACK
40.0000 meq | PACK | Freq: Two times a day (BID) | ORAL | Status: DC
  Administered 2016-04-25: 40 meq via ORAL
  Filled 2016-04-25 (×2): qty 2

## 2016-04-25 NOTE — Care Management Obs Status (Signed)
MEDICARE OBSERVATION STATUS NOTIFICATION   Patient Details  Name: Kimberly Noble MRN: [id number] Date of Birth: 07/29/1945   Medicare Observation Status Notification Given:  Other (see comment) (pt refused to sign)    Epifanio Leschesole, Annella Prowell Hudson, RN 04/25/2016, 5:04 PM

## 2016-04-25 NOTE — Discharge Instructions (Signed)
Nausea, Adult  Nausea is the feeling of an upset stomach or having to vomit. Nausea on its own is not usually a serious concern, but it may be an early sign of a more serious medical problem. As nausea gets worse, it can lead to vomiting. If vomiting develops, or if you are not able to drink enough fluids, you are at risk of becoming dehydrated. Dehydration can make you tired and thirsty, cause you to have a dry mouth, and decrease how often you urinate. Older adults and people with other diseases or a weak immune system are at higher risk for dehydration. The main goals of treating your nausea are:  · To limit repeated nausea episodes.  · To prevent vomiting and dehydration.    Follow these instructions at home:  Follow instructions from your health care provider about how to care for yourself at home.  Eating and drinking   Follow these recommendations as told by your health care provider:  · Take an oral rehydration solution (ORS). This is a drink that is sold at pharmacies and retail stores.  · Drink clear fluids in small amounts as you are able. Clear fluids include water, ice chips, diluted fruit juice, and low-calorie sports drinks.  · Eat bland, easy-to-digest foods in small amounts as you are able. These foods include bananas, applesauce, rice, lean meats, toast, and crackers.  · Avoid drinking fluids that contain a lot of sugar or caffeine, such as energy drinks, sports drinks, and soda.  · Avoid alcohol.  · Avoid spicy or fatty foods.    General instructions   · Drink enough fluid to keep your urine clear or pale yellow.  · Wash your hands often. If soap and water are not available, use hand sanitizer.  · Make sure that all people in your household wash their hands well and often.  · Rest at home while you recover.  · Take over-the-counter and prescription medicines only as told by your health care provider.  · Breathe slowly and deeply when you feel nauseous.  · Watch your condition for any  changes.  · Keep all follow-up visits as told by your health care provider. This is important.  Contact a health care provider if:  · You have a headache.  · You have new symptoms.  · Your nausea gets worse.  · You have a fever.  · You feel light-headed or dizzy.  · You vomit.  · You cannot keep fluids down.  Get help right away if:  · You have pain in your chest, neck, arm, or jaw.  · You feel extremely weak or you faint.  · You have vomit that is bright red or looks like coffee grounds.  · You have bloody or black stools or stools that look like tar.  · You have a severe headache, a stiff neck, or both.  · You have severe pain, cramping, or bloating in your abdomen.  · You have a rash.  · You have difficulty breathing or are breathing very quickly.  · Your heart is beating very quickly.  · Your skin feels cold and clammy.  · You feel confused.  · You have pain when you urinate.  · You have signs of dehydration, such as:  ? Dark urine, very little, or no urine.  ? Cracked lips.  ? Dry mouth.  ? Sunken eyes.  ? Sleepiness.  ? Weakness.  These symptoms may represent a serious problem that is an emergency. Do   not wait to see if the symptoms will go away. Get medical help right away. Call your local emergency services (911 in the [place] Do not drive yourself to the hospital.  This information is not intended to replace advice given to you by your health care provider. Make sure you discuss any questions you have with your health care provider.  Document Released: 05/08/2004 Document Revised: 09/03/2015 Document Reviewed: 12/05/2014  Elsevier Interactive Patient Education © 2017 [employer]vier Inc.

## 2016-04-25 NOTE — Progress Notes (Signed)
Kimberly FuchsShirley S Noble to be D/C'd Home per MD order.  Discussed with the patient and all questions fully answered.  VSS, Skin clean, dry and intact without evidence of skin break down, no evidence of skin tears noted. IV catheter discontinued intact. Site without signs and symptoms of complications. Dressing and pressure applied.  An After Visit Summary was printed and given to the patient. Patient received prescription.  D/c education completed with patient/family including follow up instructions, medication list, d/c activities limitations if indicated, with other d/c instructions as indicated by MD - patient able to verbalize understanding, all questions fully answered.   Patient instructed to return to ED, call 911, or call MD for any changes in condition.   Patient escorted via WC, and D/C home via private auto.  Kimberly Noble 04/25/2016 5:25 PM

## 2016-04-25 NOTE — Progress Notes (Signed)
Nutrition Brief Note  Patient identified on the Malnutrition Screening Tool (MST) Report  Wt Readings from Last 15 Encounters:  04/24/16 122 lb (55.3 kg)  03/29/15 129 lb 8 oz (58.7 kg)  04/19/08 116 lb (52.6 kg)   Kimberly Noble is a 71 y.o. female with PMH significant for agoraphobia, generalized anxiety, panic disorder, and depression who presents with dizziness and nausea.    Pt admitted with presyncope.   Pt reports her appetite is fair at baseline; she states that she does not enjoy food in general and only eats because she has to. She shares that she is a very selective eater and typically snacks on foods such as yogurt, rice, and broccoli throughout the day (during interview, pt revealed that she consumes other foods as well).   Pt shares she has always been very petite and UBW is around 100-110#. She reports a mild amount of weight loss related to a sedentary lifestyle. She shares she has not been out of her house since 2013, as a result of agoraphobia. Prior to this, she described herself as an active person who exercised daily.   Nutrition-Focused physical exam completed. Findings are mild fat depletion, no muscle depletion, and no edema.   Discussed importance of continued good oral intake to promote healing. Pt denies any further nutrition concerns at this time, but expressed appreciation for visit.   Body mass index is 24.64 kg/m. Patient meets criteria for normal based on current BMI.   Current diet order is regular, patient is consuming approximately n/a% of meals at this time. Labs and medications reviewed.   No nutrition interventions warranted at this time. If nutrition issues arise, please consult RD.   Kimberly Noble A. Kimberly Noble, RD, LDN, CDE Pager: [phone] After hours Pager: [phone]

## 2016-04-25 NOTE — Progress Notes (Signed)
Family Medicine Teaching Service Daily Progress Note Intern Pager: [phone]  Patient name: Kimberly Noble Medical record number: [id number] Date of birth: 05/28/1945 Age: 71 y.o. Gender: female  Primary Care Provider: No PCP Per Patient Consultants: Psych  Code Status: Full    Assessment and Plan: Kimberly Noble is a 71 y.o. female with PMH significant for agoraphobia, generalized anxiety, panic disorder, and depression who presents with dizziness and nausea.    # Presyncope Likely secondary to dehydration in the settings of nausea and vomiting most likely secondary to xanax withdrawal. Will continue IVF and continue to monitor. --Cardiac monitor --Zofran 4 mg IV Q8H PRN --Encourage oral intake --Continue CIWA protocol  # Anion Gap Metabolic Acidosis, resolved On maintenance IVF. Will continue to monitor. UDS and UA have been unremarkable. --Follow up on BMP --F/u on Carboxyhemoglobin   #Leukocytosis:  Likely stress-induced than infection. There is some component of dehydration as well. --Continue to monitor    #Depression/Agoraphobia/Anxiety: Long history of depression, anxiety and agoraphobia presenting with  symptoms of benzodiazepines withdrawal. Patient received alprazolam in the ED and states she has been on it for years.  --Will get in touch with psychiatrist  --Continue CIWA protocol with lorazepam scheduled and PRN.  --Folic acid 1 mg PO QD and thiamine 100 mg PO QD.   #Tobacco Abuse Smoking history is approximately 1/2 PPD for 20 years and currently smokes about 8 cigarettes daily for her nerves.  --Continue nicotine patch PRN.   Kimberly Noble/GI: Regular Diet PPx: Lovenox  Disposition: Place on Observation  Subjective:  Patient is doing well this morning, complaining of mild anxiety but has been trying to eat . Patient has some anxiety regarding drinking water but has been trying ensure with some success. Patient denies any BM in the past three days.    Objective: Temp:  [97.8 F (36.6 C)-98.9 F (37.2 C)] 97.8 F (36.6 C) (01/12 1478) Pulse Rate:  [61-118] 63 (01/12 0613) Resp:  [15-23] 21 (01/12 0613) BP: (99-161)/(42-117) 99/42 (01/12 2956) SpO2:  [96 %-99 %] 98 % (01/12 0613) Weight:  [122 lb (55.3 kg)-125 lb (56.7 kg)] 122 lb (55.3 kg) (01/11 1819)   Physical Exam: Physical Exam  Constitutional: She is oriented to person, place, and time. She appears well-developed.  HENT:  Head: Normocephalic and atraumatic.  Eyes: EOM are normal. Pupils are equal, round, and reactive to light.  Neck: Normal range of motion. Neck supple.  Cardiovascular: Normal rate, regular rhythm and normal heart sounds.   Pulmonary/Chest: Effort normal and breath sounds normal.  Abdominal: Soft. Bowel sounds are normal.  Musculoskeletal: Normal range of motion.  Neurological: She is alert and oriented to person, place, and time.  Skin: Skin is warm and dry.  Psychiatric: Her behavior is normal. Thought content normal. Her mood appears anxious.    Laboratory:  Recent Labs Lab 04/24/16 1430  WBC 17.0*  HGB 16.9*  HCT 48.9*  PLT 356    Recent Labs Lab 04/24/16 1430  NA 136  K 3.9  CL 108  CO2 12*  BUN 19  CREATININE 1.06*  CALCIUM 9.9  PROT 8.3*  BILITOT 1.3*  ALKPHOS 60  ALT 19  AST 24  GLUCOSE 86     Imaging/Diagnostic Tests: Ct Head Wo Contrast  Result Date: 04/24/2016 CLINICAL DATA:  Dizziness. EXAM: CT HEAD WITHOUT CONTRAST TECHNIQUE: Contiguous axial images were obtained from the base of the skull through the vertex without intravenous contrast. COMPARISON:  None. FINDINGS: Brain: The ventricles are normal  in size and configuration. No extra-axial fluid collections are identified. The gray-white differentiation is normal. No CT findings for acute intracranial process such as hemorrhage or infarction. No mass lesions. The brainstem and cerebellum are grossly normal. Vascular: No significant vascular calcifications,  hyperdense vessels or aneurysm. Skull: No skull fracture or bone lesions. Sinuses/Orbits: The paranasal sinuses and mastoid air cells are clear. The globes are intact. Other: No scalp lesion or hematoma. IMPRESSION: Normal head CT. Electronically Signed   By: Rudie MeyerP.  Gallerani M.D.   On: 04/24/2016 14:30   Kimberly NeighboursAbdoulaye Marielys Trinidad, MD 04/25/2016, 6:46 AM PGY-1, [facility] FPT[facility] Intern pager: [phone], text pages welcome

## 2016-04-27 NOTE — Discharge Summary (Signed)
Family Medicine Teaching [facility] Discharge Summary  Patient name: Kimberly Noble Medical record number: [id number] Date of birth: May 29, 1945 Age: 71 y.o. Gender: female Date of Admission: 04/24/2016  Date of Discharge:04/25/2016 Admitting Physician: Moses Manners, MD  Primary Care Provider: No PCP Per Patient Consultants: None  Indication for Hospitalization: Nausea, vomiting and dizziness.  Discharge Diagnoses/Problem List:  Benzodiazepine withdrawal General Anxiety Agoraphobia Panic disorder  Depression   Disposition: Home   Discharge Condition: Stable   Discharge Exam:  Constitutional: She is oriented to person, place, and time. She appears well-developed.  HENT:  Normocephalic and atraumatic, EOM are normal. Pupils are equal, round, and reactive to light. Normal range of motion. Neck supple.  Cardiovascular: Normal rate, regular rhythm and normal heart sounds.   Pulmonary/Chest: Effort normal and breath sounds normal.  Abdominal: Soft. Bowel sounds are normal.  Musculoskeletal: Normal range of motion.  Neurological: She is alert and oriented to person, place, and time.  Skin: Skin is warm and dry.  Psychiatric: Her behavior is normal. Thought content normal. Her mood appears anxious.    Brief Hospital Course:  Kimberly Noble a 71 y.o.femalewith PMH significant for agoraphobia, generalized anxiety, panic disorder, and depression who presented with dizziness and nausea. On admission patient reported 4 days of nausea, vomiting, palpitations, decrease concentration, heightened sense of smell and sound and metallic taste. Patient reported that she ran out of her Xanax that she has been taking for several years. Patient also reported increase gag reflex with water or gatorade after having multiple day of emesis. Patient also had one fall secondary to dizziness without loss of consciousness. Patient initially with leukocytosis (17) and metabolic acidosis which  eventually corrected with IVF. CT head findings were normal. Given patient constellation of symptoms on presentation, diagnosis of benzodiazepine withdrawal was made. Patient received alprazolam in the ED and was placed  on CIWA protocol. Patient continue to improve throughout hospitalization, tolerating po intake. Patient's psychiatrist contacted and was not available. On call psychiatrist physician for practice recommended short course of xanax until follow up on Monday (1/15) with Dr.Kaur (psychiatrist).  Issues for Follow Up:  1. Patient was discharged with xanax for 4 days until she sees psychiatrist on Monday January 15. On call physician for the practice was called on Friday and case was discussed with him. He suggested above plan and will leave note for patient's psychiatrist Dr. Evelene Croon.  Significant Procedures: None   Significant Labs and Imaging:   Recent Labs Lab 04/24/16 1430  WBC 17.0*  HGB 16.9*  HCT 48.9*  PLT 356    Recent Labs Lab 04/24/16 1430 04/25/16 0007 04/25/16 0709  NA 136  --  137  K 3.9  --  3.2*  CL 108  --  114*  CO2 12*  --  15*  GLUCOSE 86  --  130*  BUN 19  --  11  CREATININE 1.06*  --  0.78  CALCIUM 9.9  --  8.4*  MG  --  2.1  --   PHOS  --  2.2*  --   ALKPHOS 60  --   --   AST 24  --   --   ALT 19  --   --   ALBUMIN 4.5  --   --    UDS: +for Benzo  Ct Head Wo Contrast  Result Date: 04/24/2016 CLINICAL DATA:  Dizziness. EXAM: CT HEAD WITHOUT CONTRAST TECHNIQUE: Contiguous axial images were obtained from the base of the skull through  the vertex without intravenous contrast. COMPARISON:  None. FINDINGS: Brain: The ventricles are normal in size and configuration. No extra-axial fluid collections are identified. The gray-white differentiation is normal. No CT findings for acute intracranial process such as hemorrhage or infarction. No mass lesions. The brainstem and cerebellum are grossly normal. Vascular: No significant vascular calcifications,  hyperdense vessels or aneurysm. Skull: No skull fracture or bone lesions. Sinuses/Orbits: The paranasal sinuses and mastoid air cells are clear. The globes are intact. Other: No scalp lesion or hematoma. IMPRESSION: Normal head CT. Electronically Signed   By: Rudie MeyerP.  Gallerani M.D.   On: 04/24/2016 14:30    Results/Tests Pending at Time of Discharge: None  Discharge Medications:  Allergies as of 04/25/2016      Reactions   Penicillins Rash   Has patient had a PCN reaction causing immediate rash, facial/tongue/throat swelling, SOB or lightheadedness with hypotension: Yes Has patient had a PCN reaction causing severe rash involving mucus membranes or skin necrosis: No Has patient had a PCN reaction that required hospitalization No Has patient had a PCN reaction occurring within the last 10 years: No If all of the above answers are "NO", then may proceed with Cephalosporin use.      Medication List    TAKE these medications   ALPRAZolam 1 MG tablet Commonly known as:  XANAX Take 1 mg by mouth daily. What changed:  Another medication with the same name was added. Make sure you understand how and when to take each.   ALPRAZolam 1 MG tablet Commonly known as:  XANAX Take 1 tablet (1 mg total) by mouth daily. What changed:  You were already taking a medication with the same name, and this prescription was added. Make sure you understand how and when to take each.   aspirin EC 81 MG tablet Take 81 mg by mouth daily.   calcium-vitamin D 500-200 MG-UNIT tablet Commonly known as:  OSCAL WITH D Take 1 tablet by mouth daily.   CRANBERRY PO Take 1 tablet by mouth daily.   FISH OIL PO Take 1 capsule by mouth daily.   HAIR/SKIN/NAILS PO Take 1 tablet by mouth daily.   LUTEIN PO Take 1 tablet by mouth daily.   multivitamin with minerals tablet Take 1 tablet by mouth daily.   vitamin C 500 MG tablet Commonly known as:  ASCORBIC ACID Take 500 mg by mouth daily.   vitamin E 100 UNIT  capsule Take 100 Units by mouth daily.       Discharge Instructions: Please refer to Patient Instructions section of EMR for full details.  Patient was counseled important signs and symptoms that should prompt return to medical care, changes in medications, dietary instructions, activity restrictions, and follow up appointments.   Follow-Up Appointments: Follow-up Information    Glori BickersKAUR,RUPINDER DHAMI, MD. Call on 04/28/2016.   Specialty:  Psychiatry Contact information: [address] [address] [place] [address] [phone]           Lovena NeighboursAbdoulaye Jahayra Mazo, MD 04/27/2016, 10:53 PM PGY-1, [facility]

## 2018-12-31 ENCOUNTER — Telehealth: Payer: Self-pay | Admitting: General Practice

## 2018-12-31 NOTE — Telephone Encounter (Signed)
Pt was informed that dismissal can not be reversed per Dorna Bloom

## 2019-06-12 ENCOUNTER — Ambulatory Visit: Payer: Medicare Other | Attending: Internal Medicine

## 2019-06-12 DIAGNOSIS — Z23 Encounter for immunization: Secondary | ICD-10-CM

## 2019-06-12 NOTE — Progress Notes (Signed)
   Covid-19 Vaccination Clinic  Name:  Kimberly Noble    MRN: [id number] DOB: 11-20-1945  06/12/2019  [name] was observed post Covid-19 immunization for 30 minutes based on pre-vaccination screening without incidence. She was provided with Vaccine Information Sheet and instruction to access the V-Safe system.   [name] was instructed to call 911 with any severe reactions post vaccine: Marland Kitchen Difficulty breathing  . Swelling of your face and throat  . A fast heartbeat  . A bad rash all over your body  . Dizziness and weakness    Immunizations Administered    Name Date Dose VIS Date Route   Pfizer COVID-19 Vaccine 06/12/2019  2:07 PM 0.3 mL 03/25/2019 Intramuscular   Manufacturer: [employer], [employer]   Lot: [id number]   NDC: [address]-3

## 2019-07-13 ENCOUNTER — Ambulatory Visit: Payer: Medicare Other | Attending: Internal Medicine

## 2019-07-13 DIAGNOSIS — Z23 Encounter for immunization: Secondary | ICD-10-CM

## 2019-07-13 NOTE — Progress Notes (Signed)
   Covid-19 Vaccination Clinic  Name:  REONNA FINLAYSON    MRN: [id number] DOB: 24-Mar-1946  07/13/2019  Ms. Rocks was observed post Covid-19 immunization for 15 minutes without incident. She was provided with Vaccine Information Sheet and instruction to access the V-Safe system.   Ms. Oceguera was instructed to call 911 with any severe reactions post vaccine: Marland Kitchen Difficulty breathing  . Swelling of face and throat  . A fast heartbeat  . A bad rash all over body  . Dizziness and weakness   Immunizations Administered    Name Date Dose VIS Date Route   Pfizer COVID-19 Vaccine 07/13/2019  9:48 AM 0.3 mL 03/25/2019 Intramuscular   Manufacturer: [employer], [employer]   Lot: [id number]   NDC: [address]-7

## 2020-01-02 ENCOUNTER — Ambulatory Visit (HOSPITAL_COMMUNITY): Payer: Medicare Other | Admitting: Licensed Clinical Social Worker

## 2020-01-28 ENCOUNTER — Ambulatory Visit: Payer: Medicare Other | Attending: Internal Medicine

## 2020-01-28 DIAGNOSIS — Z23 Encounter for immunization: Secondary | ICD-10-CM

## 2020-01-28 NOTE — Progress Notes (Signed)
   Covid-19 Vaccination Clinic  Name:  Kimberly Noble    MRN: [id number] DOB: 10/20/1945  01/28/2020  Kimberly Noble was observed post Covid-19 immunization for 15 minutes without incident. She was provided with Vaccine Information Sheet and instruction to access the V-Safe system.   Kimberly Noble was instructed to call 911 with any severe reactions post vaccine: Marland Kitchen Difficulty breathing  . Swelling of face and throat  . A fast heartbeat  . A bad rash all over body  . Dizziness and weakness

## 2020-02-21 ENCOUNTER — Ambulatory Visit (INDEPENDENT_AMBULATORY_CARE_PROVIDER_SITE_OTHER): Payer: Medicare Other | Admitting: Licensed Clinical Social Worker

## 2020-02-21 DIAGNOSIS — F4001 Agoraphobia with panic disorder: Secondary | ICD-10-CM

## 2020-02-21 DIAGNOSIS — F411 Generalized anxiety disorder: Secondary | ICD-10-CM

## 2020-02-21 DIAGNOSIS — F429 Obsessive-compulsive disorder, unspecified: Secondary | ICD-10-CM | POA: Diagnosis not present

## 2020-02-21 DIAGNOSIS — F331 Major depressive disorder, recurrent, moderate: Secondary | ICD-10-CM
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
## 2020-03-07 ENCOUNTER — Telehealth (INDEPENDENT_AMBULATORY_CARE_PROVIDER_SITE_OTHER): Payer: Medicare Other | Admitting: Psychiatry

## 2020-03-07 ENCOUNTER — Encounter (HOSPITAL_COMMUNITY): Payer: Self-pay | Admitting: Psychiatry

## 2020-03-07 DIAGNOSIS — F4001 Agoraphobia with panic disorder: Secondary | ICD-10-CM | POA: Diagnosis not present

## 2020-03-07 DIAGNOSIS — F331 Major depressive disorder, recurrent, moderate: Secondary | ICD-10-CM

## 2020-03-07 DIAGNOSIS — F411 Generalized anxiety disorder: Secondary | ICD-10-CM | POA: Diagnosis not present

## 2020-03-07 MED ORDER — GABAPENTIN 100 MG PO CAPS: 100.0000 mg | ORAL_CAPSULE | Freq: Three times a day (TID) | ORAL | 0 refills | Status: DC

## 2020-03-07 NOTE — Progress Notes (Signed)
Psychiatric Initial Adult Assessment   Patient Identification: Kimberly Noble MRN:  [id number] Date of Evaluation:  03/07/2020 Referral Source: [address] Chief Complaint:  anxiety Visit Diagnosis:    ICD-10-CM   1. Major depressive disorder, recurrent episode, moderate (HCC)  F33.1   2. Generalized anxiety disorder  F41.1   3. Panic disorder with agoraphobia  F40.01     I connected with [name] on 03/07/20 at  9:00 AM EST by a video enabled telemedicine application and verified that I am speaking with the correct person using two identifiers. Patient had video connection problems so later changed to audio. I was able to do virtual  Location: Patient: home  Provider: home office   I discussed the limitations of evaluation and management by telemedicine and the availability of in person appointments. The patient expressed understanding and agreed to proceed.  History of Present Illness: Patient is a 74 years old currently married Caucasian female referred by her counselor for management of anxiety and depression.  She is staying at home  Patient endorses anxiety and panic attacks states that she has been home for the last 10 years last admission in the hospital was around 10 years ago she has been admitted multiple times in the past and states she has seen multiple psychiatrist and been on most of the medication on board and psychiatry according to her.  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 Maggie Schwalbe and also has seen Dr. Evelene Croon.  States she has seen many psychiatrist and as above been on nearly most of the medication .  She has started counseling with Baruch Merl anxiety panic attacks when she has to be around or when she gets stressed out  endorses feeling of hopelessness at times depression sadness crying spells she feels limited to home and when she goes out she has panic attacks her husband is very supportive She has started talking about being on benzodiazepine clinic Klonopin and Xanax that has helped.  Says nobody wants to give that to me.  She was explained its effect its effect on memory and its effect on depression including dependence and tolerance   Does not endorse psychotic symptoms or delusions  Does not endorse manic symptoms she does not endorse increased racing thoughts or irrational behavior or being diagnosed with mania  States no trigger for depression she is financially stable and relationship is also going on well  Admission for hospital was for suicidal ideation she did not attempted but she said that she has suffered from chronic depression and has been diagnosed with resistant depression and has changed many providers or psychiatrist before.  Aggravating factors; denies any particular one Modifying factors; her husband, finances  Drug and alcohol use denies     Past Psychiatric History: depression, anxiety  Previous Psychotropic Medications: Yes   Substance Abuse History in the last 12 months:  No.  Consequences of Substance Abuse: NA  Past Medical History:  Past Medical History:  Diagnosis Date  . Agoraphobia with panic attacks   . Anxiety attack   . Major depression   . Superficial laceration of hand ~ 03/2015   left thumb    Past Surgical History:  Procedure Laterality Date  . TONSILLECTOMY  1950's    Family Psychiatric History:  denies  Family History: History reviewed. No pertinent family history.  Social History:   Social History   Socioeconomic History  . Marital status: Married    Spouse name: Not on file  . Number of children: Not on file  . Years of education: Not on file  . Highest education level: Not on file  Occupational History  . Not on file  Tobacco Use   . Smoking status: Current Some Day Smoker    Packs/day: 0.33    Years: 40.00    Pack years: 13.20    Types: Cigarettes  . Smokeless tobacco: Never Used  Substance and Sexual Activity  . Alcohol use: No  . Drug use: No  . Sexual activity: Never  Other Topics Concern  . Not on file  Social History Narrative  . Not on file   Social Determinants of Health   Financial Resource Strain:   . Difficulty of Paying Living Expenses: Not on file  Food Insecurity:   . Worried About Programme researcher, broadcasting/film/video in the Last Year: Not on file  . Ran Out of Food in the Last Year: Not on file  Transportation Needs:   . Lack of Transportation (Medical): Not on file  . Lack of Transportation (Non-Medical): Not on file  Physical Activity:   . Days of Exercise per Week: Not on file  . Minutes of Exercise per Session: Not on file  Stress:   . Feeling of Stress : Not on file  Social Connections:   . Frequency of Communication with Friends and Family: Not on file  . Frequency of Social Gatherings with Friends and Family: Not on file  . Attends Religious Services: Not on file  . Active Member of Clubs or Organizations: Not on file  . Attends Banker Meetings: Not on file  . Marital Status: Not on file    Additional Social History: grew up with parents. No abuse, had grown up in upper middle class family and had one sister. Married no kids,   Allergies:   Allergies  Allergen Reactions  . Penicillins Rash    Has patient had a PCN reaction causing immediate rash, facial/tongue/throat swelling, SOB or lightheadedness with hypotension: Yes Has patient had a PCN reaction causing severe rash involving mucus membranes or skin necrosis: No Has patient had a PCN reaction that required hospitalization No Has patient had a PCN reaction occurring within the last 10 years: No If all of the above answers are "NO", then may proceed with Cephalosporin use.     Metabolic Disorder Labs: No results  found for: HGBA1C, MPG No results found for: PROLACTIN Lab Results  Component Value Date   CHOL 246 (HH) 02/01/2008   TRIG 63 02/01/2008   HDL 54.8 02/01/2008   CHOLHDL 4.5 CALC 02/01/2008   VLDL 13 02/01/2008   Lab Results  Component Value Date   TSH 1.527 04/24/2016    Therapeutic Level Labs: No results found for: LITHIUM No results found for: CBMZ No results found for: VALPROATE  Current Medications: Current Outpatient Medications  Medication Sig Dispense Refill  . ALPRAZolam (XANAX) 1 MG tablet Take 1 mg by mouth daily.     Marland Kitchen ALPRAZolam (XANAX) 1 MG tablet Take 1 tablet (1 mg total) by mouth daily. 5 tablet 0  . aspirin EC 81 MG tablet Take 81 mg by mouth daily.    . Biotin w/ Vitamins C & E (HAIR/SKIN/NAILS PO) Take 1 tablet by mouth daily.    Marland Kitchen  calcium-vitamin D (OSCAL WITH D) 500-200 MG-UNIT tablet Take 1 tablet by mouth daily.     Marland Kitchen CRANBERRY PO Take 1 tablet by mouth daily.    Marland Kitchen gabapentin (NEURONTIN) 100 MG capsule Take 1 capsule (100 mg total) by mouth 3 (three) times daily. 90 capsule 0  . LUTEIN PO Take 1 tablet by mouth daily.    . Multiple Vitamins-Minerals (MULTIVITAMIN WITH MINERALS) tablet Take 1 tablet by mouth daily.    . Omega-3 Fatty Acids (FISH OIL PO) Take 1 capsule by mouth daily.    . vitamin C (ASCORBIC ACID) 500 MG tablet Take 500 mg by mouth daily.    . vitamin E 100 UNIT capsule Take 100 Units by mouth daily.     No current facility-administered medications for this visit.      Psychiatric Specialty Exam: Review of Systems  Cardiovascular: Negative for chest pain.  Psychiatric/Behavioral: Positive for agitation. The patient is nervous/anxious.     There were no vitals taken for this visit.There is no height or weight on file to calculate BMI.  General Appearance: Fairly Groomed  Eye Contact:  Fair  Speech:  Normal Rate  Volume:  Normal  Mood:  Dysphoric  Affect:  Congruent  Thought Process:  Goal Directed  Orientation:  Full (Time,  Place, and Person)  Thought Content:  Rumination  Suicidal Thoughts:  No  Homicidal Thoughts:  No  Memory:  Immediate;   Fair Recent;   Fair  Judgement:  Poor  Insight:  Present  Psychomotor Activity:  Normal  Concentration:  Concentration: Fair and Attention Span: Fair  Recall:  [employer] of Knowledge:Fair  Language: Fair  Akathisia:  No  Handed:  AIMS (if indicated):  not done  Assets:  Financial Resources/Insurance Leisure Time  ADL's:  Intact  Cognition: WNL  Sleep:  Fair   Screenings:   Assessment and Plan: as follows  Major depressive disorder recurrent moderate to severe; we talked about possibly starting Prozac since that is the only medication that she felt some better but she does not want to be on SSRI now she is endorsing more anxiety.  Continue therapy and counseling  Generalized anxiety disorder; she believes only medication that is helped has been gabapentin we will reinstate that 100 milligrams 3 times a day  Panic attacks with agoraphobia; Discussed breathing techniques discussed to continue therapy in counseling since she has been resistant to medication or they have not worked will focus on counseling therapy but but start gabapentin she does feel some comfortable level of the gabapentin and milligram 3 times a day  We discussed the prognosis considering she has been on multiple medication and since seen multiple providers to consider counseling as the main treatment option to work on panic attacks and depression we will start gabapentin since she feels comfortable with and continue supportive therapy  I discussed the assessment and treatment plan with the patient. The patient was provided an opportunity to ask questions and all were answered. The patient agreed with the plan and demonstrated an understanding of the instructions.   The patient was advised to call back or seek an in-person evaluation if the symptoms worsen or if the condition fails to improve  as anticipated. FU 4 weeks or earlier if needed I provided 35 - 40  minutes of non-face-to-face time during this encounter.  Thresa Ross, MD 11/24/20219:40 AM

## 2020-03-14 ENCOUNTER — Ambulatory Visit (INDEPENDENT_AMBULATORY_CARE_PROVIDER_SITE_OTHER): Payer: Medicare Other | Admitting: Licensed Clinical Social Worker

## 2020-03-14 DIAGNOSIS — F331 Major depressive disorder, recurrent, moderate: Secondary | ICD-10-CM | POA: Diagnosis not present

## 2020-03-14 DIAGNOSIS — F411 Generalized anxiety disorder: Secondary | ICD-10-CM | POA: Diagnosis not present

## 2020-03-14 DIAGNOSIS — F4001 Agoraphobia with panic disorder: Secondary | ICD-10-CM

## 2020-03-14 DIAGNOSIS — F429 Obsessive-compulsive disorder, unspecified: Secondary | ICD-10-CM | POA: Diagnosis not present

## 2020-03-14 NOTE — Progress Notes (Signed)
Virtual Visit via Telephone Note  I connected with Kimberly Noble on 03/14/20 at  2:00 PM EST by telephone and verified that I am speaking with the correct person using two identifiers.  Location: Patient: home  Provider: home office   I discussed the limitations, risks, security and privacy concerns of performing an evaluation and management service by telephone and the availability of in person appointments. I also discussed with the patient that there may be a patient responsible charge related to this service. The patient expressed understanding and agreed to proceed.   I discussed the assessment and treatment plan with the patient. The patient was provided an opportunity to ask questions and all were answered. The patient agreed with the plan and demonstrated an understanding of the instructions.   The patient was advised to call back or seek an in-person evaluation if the symptoms worsen or if the condition fails to improve as anticipated.  I provided 52 minutes of non-face-to-face time during this encounter.   THERAPIST PROGRESS NOTE  Session Time: 2:00 PM-2:52 PM  Participation Level: Active  Behavioral Response: CasualAlertAnxious  Type of Therapy: Individual Therapy  Treatment Goals addressed:  Anxiety, panic, coping Interventions: CBT, Solution Focused, Strength-based, Supportive and Other: coping  Summary: Kimberly Noble is a 74 y.o. female who presents with can't take Neurontin gives her bad side effects.   Only thing that helped is Xanax constant panic attack don't understand doctor who is not helping with giving her the only thing that helps. Patient shares never addicted to anything. Not to give somebody something even if addictive to help doesn't understand. Wants to live with peace without shaking. A friend to help her with things in the house. She goes to room with patient, patient is hoarding. Ok for awhile then starts hyperventilate until finally feeling  she is going to pass out. Says to herself that she has to fight this until get over it. She is organizing things. Legs rubbery going down the stairs. Can stay with her in 15 minutes pushed it longer 20-25. Confronting her compulsive shopping by going into the room, sees how how extensive it was, if doesn't go in doesn't how to face what she did. Her high was shopping. Panic is a different animal could live with chronic anxiety and OCD. It started 2013. Not sure what the trigger was, possibly taking care of mom 2-3 years with Alzheimer and dogs dying. Not sure thought. Does deep breathing but doesn't help.(therapist reviewed technique with her). Has gym, will set as a goal to try to get on tredmill. Also continue to go into rooms and face anxiety. Has had depression all her life and tried anti-depressants and haven't worked. Knows she has to work on it and will work on it.  Therapist pointed this out is very helpful for working with her in therapy.  Also noted patient's good physical health she feels fortunate and therapist pointed this out as a strength.     Suicidal/Homicidal: No  Therapist Response: Therapist reviewed symptoms, facilitated expression of thoughts and feelings, educated patient on anxiety that is Art therapist that is misfiring it is to be tested not trusted, discussed the get used to it strategy which means becoming more comfortable facing her anxiety step-by-step.  Provided positive feedback for patient taking steps to already do this saying this is going to help her make progress on treatment goals.  Discussed working toward room with treadmill, as also working on facing anxiety, reviewed physical type  exercises help decrease anxiety as it gets to the source of anxiety which is the amygdala and fighter flight, so getting on treadmill as a way to reset her system to calm the amygdala down so she is not so easily triggered.  Noted patient's strengths of physical health and reviewed this is a  resource to help her in treatment.  Encourage patient when she begins to get more anxious to take a pause take of breath and give herself a self statement to calm herself down.  Reviewed education on benzos, the addictive quality and long-term is not helpful for anxiety.  Reviewed other medications to try for anxiety as well as helpfulness and assessed patient aware and positive for treatment of doing her part to apply coping skills.  Therapist provided active listening, open questions supportive interventions.  Plan: Return again in 1 week.2.  Therapist review assessment, treatment plan, review appointment dates as well as continuing to work on anxiety  Diagnosis: Axis I: Major depressive disorder, recurrent, moderate, panic disorder with agoraphobia, generalized anxiety disorder, obsessive-compulsive disorder unspecified type   Axis II: No diagnosis    Coolidge Breeze, LCSW 03/14/2020

## 2020-03-21 ENCOUNTER — Ambulatory Visit (INDEPENDENT_AMBULATORY_CARE_PROVIDER_SITE_OTHER): Payer: Medicare Other | Admitting: Licensed Clinical Social Worker

## 2020-03-21 DIAGNOSIS — F4001 Agoraphobia with panic disorder: Secondary | ICD-10-CM

## 2020-03-21 DIAGNOSIS — F429 Obsessive-compulsive disorder, unspecified: Secondary | ICD-10-CM

## 2020-03-21 DIAGNOSIS — F331 Major depressive disorder, recurrent, moderate: Secondary | ICD-10-CM | POA: Diagnosis not present

## 2020-03-21 DIAGNOSIS — F411 Generalized anxiety disorder: Secondary | ICD-10-CM

## 2020-03-21 NOTE — Progress Notes (Signed)
Virtual Visit via Telephone Note  I connected with Kimberly Noble on 03/21/20 at  8:00 AM EST by telephone and verified that I am speaking with the correct person using two identifiers.  Location: Patient: home Provider: home office   I discussed the limitations, risks, security and privacy concerns of performing an evaluation and management service by telephone and the availability of in person appointments. I also discussed with the patient that there may be a patient responsible charge related to this service. The patient expressed understanding and agreed to proceed.   I discussed the assessment and treatment plan with the patient. The patient was provided an opportunity to ask questions and all were answered. The patient agreed with the plan and demonstrated an understanding of the instructions.   The patient was advised to call back or seek an in-person evaluation if the symptoms worsen or if the condition fails to improve as anticipated.  I provided 52 minutes of non-face-to-face time during this encounter.  THERAPIST PROGRESS NOTE  Session Time: 8:00 AM to 8:52 AM  Participation Level: Active  Behavioral Response: CasualAlertAnxious and Depressed  Type of Therapy: Individual Therapy  Treatment Goals addressed:  Patient be able to go into another room, to be able to get into her car and go to the grocery store, decreased depressive symptoms, coping Interventions: Solution Focused, Strength-based, Supportive and Other: Coping  Summary: Kimberly Noble is a 74 y.o. female who presents with hyperventilating badly at start of sessoin. Diarrhea everywhere and asked therapist "Do you know how that it is for a germaphobe?". Think worried about cat going for a check up and he has high kidney levels.  Therapist guided patient in talking about her cat her close relationship as a way of increasing positive emotions to help with her symptoms.  Discussed ringing in her ear that  increases during the day she relates connected to panic. Two appointments with psychiatrist, Dr. Jacqulyn Bath (specialize in panic) and a psychiatrist she had been to before. Have to have medications to mitigate. Has four mental illnesses dealing with especially with germaphobia. Girlfriend hasn't been able to come over to go in room. Has to have her with her, doesn't think can get down the stairs concerned about falling, because of panic gets dizzy thinks will pass out and hyperventilation, it is so bad will fall, needs someone in shower. Goes down on butt, legs are like rubber. Given her every anti-depressant, psychotropic, given Seroquel again. Neurontin doesn't work again. Told depression is treatment resistant. Can't touch food, doesn't eat meat, uses gloves, not hungry has only had shakes in past few days. Never been hungry all her life, makes herself eat something  likes sweets. Day after spoke with therapist was calm. Didn't have panic. It started again the next day. Not sure what to make of being calm and was quite unusual.  Therapist pointed out her belief that patient talking about feelings helps with her symptoms as noted during session. Suicidal/Homicidal: No  Therapist Response: Therapist reviewed symptoms, facilitated expression of thoughts and feelings, completed assessment and completed treatment plan patient gave verbal consent to complete by phone.  Therapist reviewed strategies that are helpful for managing feelings including healthier management is to express them, processed them and release them rather than holding them in identifying patient does hold things in.  Discussed how avoidance of things will escalate symptoms.  In completing assessment explored underlying factors that may be contributing to patient's symptoms including mom was anxious, patient coming  from a protective background and living in a "bubble" not developing life skills from family of origin.  Provided positive feedback for  patient continuing to make sure to have psychiatric care is helpful in managing symptoms.  Therapist provided active listening, open questions supportive interventions  Plan: Return again in 1 week.2.  Therapist work with patient on anxiety, panic, depression  Diagnosis: Axis I:  Major depressive disorder, recurrent, moderate, panic disorder with agoraphobia, generalized anxiety disorder, obsessive-compulsive disorder unspecified type    Axis II: No diagnosis    Coolidge Breeze, LCSW 03/21/2020

## 2020-03-28 ENCOUNTER — Ambulatory Visit (INDEPENDENT_AMBULATORY_CARE_PROVIDER_SITE_OTHER): Payer: Medicare Other | Admitting: Licensed Clinical Social Worker

## 2020-03-28 DIAGNOSIS — F331 Major depressive disorder, recurrent, moderate: Secondary | ICD-10-CM | POA: Diagnosis not present

## 2020-03-28 DIAGNOSIS — F411 Generalized anxiety disorder: Secondary | ICD-10-CM

## 2020-03-28 DIAGNOSIS — F429 Obsessive-compulsive disorder, unspecified: Secondary | ICD-10-CM | POA: Diagnosis not present

## 2020-03-28 DIAGNOSIS — F4001 Agoraphobia with panic disorder: Secondary | ICD-10-CM | POA: Diagnosis not present

## 2020-03-28 NOTE — Progress Notes (Signed)
Virtual Visit via Telephone Note  I connected with Kimberly Noble on 03/28/20 at 11:00 AM EST by telephone and verified that I am speaking with the correct person using two identifiers.  Location: Patient: home Provider: home office   I discussed the limitations, risks, security and privacy concerns of performing an evaluation and management service by telephone and the availability of in person appointments. I also discussed with the patient that there may be a patient responsible charge related to this service. The patient expressed understanding and agreed to proceed.   I discussed the assessment and treatment plan with the patient. The patient was provided an opportunity to ask questions and all were answered. The patient agreed with the plan and demonstrated an understanding of the instructions.   The patient was advised to call back or seek an in-person evaluation if the symptoms worsen or if the condition fails to improve as anticipated.  I provided 52 minutes of non-face-to-face time during this encounter.   THERAPIST PROGRESS NOTE  Session Time: 11:00 AM to 11:52 AM  Participation Level: Active  Behavioral Response: CasualAlertAngry, Anxious and Depressed  Type of Therapy: Psycho-education Group  Treatment Goals addressed:  Patient be able to go into another room, to be able to get into her car and go to the grocery store, decreased depressive symptoms, coping Interventions: CBT, Solution Focused, Strength-based, Supportive and Other: coping, relationship issues and skills  Summary: Kimberly Noble is a 74 y.o. female who presents with not doing too well. Extremely dizzy-room spins turn to the side and think will pass out one of the symptoms of her panic. She got Klonopin but gets bad side effects can't function on it the way she could on Xanax. It does mitigate some of the panic attack symptoms heart pounding out of chest, shaking says learned thought can exerbate  depression and cause it. Back to psychiatrist, Dr. (Izzy)Dr. Z. Izediuno she thinks, he also prescribed her 40 mg Prozac and added 20 mg Prozac and doesn't understand why prescribed never worked. Gets frustrated with doctors. Discussed why doctors won't prescribe her Xanax tha tshe had withdrawal from Xanax (patient said didn't know it was addictive). Understand things have changed. Have to accept it and won't function the rest of her life (therapist challenged patient on all or nothing thinking). Has taken Xanax a long time. Clinical depression since a child function fine with chronic anxiety, depression, OCD but can't function now not on Xanax. Shares she had 3 girlfriends die, lost of things that help her cope plays a part. Extremely lonely husband works all day, don't talk. Wants therapist to help her in confronting her husband about things. Thinks he has done over the years and how upset over the years. Patient can confront everyone on the outside, personally hard to confront. Husband was alcoholic everything fine when going together people warned her about him and patient sees her part for not listening. Was in 30's engaged 3x's before and didn't love them before (Husband, Aurther Loft). Harder patient says for her to feel things for people more she feels in general for animals. Describes more about the relationship, she hung on to him for dear life, thought he was a cheat, a runaround and she wouldn't listen to anyone. "You don't listen and understands you make own bed." Made her laugh, made her want to live, into himself but do anything for her. Liked him more than he loved him. He had gift of gab. They were separated for awhile  another issue is he is Sales promotion account executive. Thought about her mom as a [employer] [place]. Surreptitiously got money from her and it was lie said it was to save his business. Many more things won't go into. Need to confront him. she never cheated, never lied a good person, likes to help people. Never  did anything to him but be good to him. He would threatened to leave. Thinks confronting him will make her feel better. Basically don't talk now. Get a straight answer and need to talk about serious matters. Has to confront patient she is "seething for things he has to done." Couple's therapy before and he wouldn't talk Wants to get strong enough so confront him. Maybe he is older now break down and tell her the truth. Sees ways she was in the wrong. She had to do everything around the house, he didn't know how to fix things. Have put him down and controlling person. Shouldn't have yelled at him. The woman he left her for was an "alcoholic skank" Anger seething taught she doesn't matter, so doesn't deserve to know the truth and treated well. "Admit what you did to me. He thinks it is nothing." Wants an apology. It is over and done but apology for that. Shouldn't have taken him back. Only one cared about her life. Sleeps upstairs can't go up to bed room because of panic disorder, only has 10 minutes a day they interact. He goes out with friends to restaurants and bars. Shared really trying hard to work on OCD and explains she says to herself "if do that nothing happen". Still has to sanitize everything. Explains in more detail if watching something on TV have to finish watching it or something will happen. Trying not to give in, the horrible feeling come through it and see nothing bad will happen if stop watching or put in wrong place. Compounding the problem is the clinically depression. OCD morphed into other things that started when she was a child. Therapist explained that OCD is explained by caudate nucleus malfunctioning and think of it as malfunctioning in sorting important thoughts from unimportant ones and letting some go to junk mail.          Suicidal/Homicidal: No  Therapist Response: Therapist reviewed symptoms, facilitated expression of thoughts and feeling, processed feelings related to patient  wanting to confront her husband about things from the past, therapist looked at the pros and cons of doing this, exploring what she wants to get from it to help her in making healthy decisions for her in a relationship.  Uncovered patient feeling anger her feeling does not deserve to know the truth or treated well.  Discussed speaking her truth is beneficial for her to work through her anger, speaking her truth helping her partner acknowledge she needs to be treated well, having apology having him here her would help with this process.  Therapist identified cognitive distortions and explained to patient how they can keep mood symptoms going including all or nothing thinking, the 3 piece that are things always be bad, things are all bad and its my fault, explaining cognitive distortions are inaccurate ways of seeing things.  Therapist utilized reframing to see about ways she could work on improving current relationship and not just confronting her husband.  Provided positive feedback for patient confronting anxiety around her OCD and explained how it can relate to working on other forms of anxiety by feeling the anxiety and doing it anyway such as going to different  rooms in the house and once she experiences the anxiety it will decrease.  Therapist provided active listening, open questions supportive interventions  Plan: Return again in 4 weeks.2.  Patient continued to take positive steps and challenging her anxiety. 3.  Therapist work with patient on relationship issues and coping skills for anxiety and depression  Diagnosis: Axis I: Major depressive disorder, recurrent, moderate, panic disorder with agoraphobia, generalized anxiety disorder, obsessive-compulsive disorder unspecified type    Axis II: No diagnosis    Coolidge Breeze, LCSW 03/28/2020

## 2020-04-17 ENCOUNTER — Telehealth (HOSPITAL_COMMUNITY): Payer: Medicare Other | Admitting: Psychiatry

## 2020-04-26 ENCOUNTER — Ambulatory Visit (HOSPITAL_COMMUNITY): Payer: Medicare Other | Admitting: Licensed Clinical Social Worker

## 2020-05-01 ENCOUNTER — Ambulatory Visit (INDEPENDENT_AMBULATORY_CARE_PROVIDER_SITE_OTHER): Payer: Medicare Other | Admitting: Licensed Clinical Social Worker

## 2020-05-01 DIAGNOSIS — F4001 Agoraphobia with panic disorder: Secondary | ICD-10-CM

## 2020-05-01 DIAGNOSIS — F429 Obsessive-compulsive disorder, unspecified: Secondary | ICD-10-CM

## 2020-05-01 DIAGNOSIS — F411 Generalized anxiety disorder: Secondary | ICD-10-CM

## 2020-05-01 DIAGNOSIS — F331 Major depressive disorder, recurrent, moderate: Secondary | ICD-10-CM

## 2020-05-01 NOTE — Progress Notes (Signed)
Virtual Visit via Telephone Note  I connected with Kimberly Noble on 05/01/20 at 10:00 AM EST by telephone and verified that I am speaking with the correct person using two identifiers.  Location: Patient: home  Provider: home office   I discussed the limitations, risks, security and privacy concerns of performing an evaluation and management service by telephone and the availability of in person appointments. I also discussed with the patient that there may be a patient responsible charge related to this service. The patient expressed understanding and agreed to proceed.  I discussed the assessment and treatment plan with the patient. The patient was provided an opportunity to ask questions and all were answered. The patient agreed with the plan and demonstrated an understanding of the instructions.   The patient was advised to call back or seek an in-person evaluation if the symptoms worsen or if the condition fails to improve as anticipated.  I provided 52 minutes of non-face-to-face time during this encounter.  THERAPIST PROGRESS NOTE  Session Time: 10:00 AM to 10:52 AM  Participation Level: Active  Behavioral Response: CasualAlertAnxious and Depressed  Type of Therapy: Individual Therapy  Treatment Goals addressed: Patient be able to go into another room, to be able to get into her car and go to the grocery store, decreased depressive symptoms, coping  Interventions: CBT, Solution Focused, Strength-based, Supportive, Reframing and Other: coping  Summary: Kimberly Noble is a 75 y.o. female who presents with girlfriend who is diagnosed with breast cancer. Will be operated on February 8. Three of her other friends have passed. Friend has other medical problems. When heard she couldn't believe it. Neighbor hasn't told her but has heard that it acting like a not a big deal. Difficult for patient to hear. Friend doesn't tell things but also knows patient worries all the time.  Trying to tackle OCD. Hers is so complicated. Try hard not to get into it. Tell herself nothing going to happen if you do or don't do that. Psychiatrist gives her Klonopin and Prozac. "useless" needs something. Klonopin helps some. Nobody is going to give her Xanax and accept the fact that she is not going to get it. Gives her Seroquel. Knocks her out for two days. Bothers her that doctors don't listen to her. Ridiculous to call in stuff that doesn't work that took years ago. Not better as far as anxiety, depression, panic attack. Tries to go into rooms and only can stay 15 minutes. Discussed COVID hasn't changed her life very much though used to it because isolating and lonely. Has compulsive shopping issue.  Therapist guided patient in slowing down using self talk but patient says she does not even think about it when she is doing it. She has the thrill of buying self-talk doesn't enter it and doesn't remember even buying things. Nothing makes her feel better except shopping. Neighbor took out [employer].  In looking at strategies to change it patient does recognize throwing money away. Doesn't use things maybe the feeling will use it some day. "Glimmer of hope one day she will go out." To cope has to hope. Discussed meaningful experiences and patient says doing charity work and working with animals was meaningful. All of this was before the panic disorder, when had chronic anxiety and before panic could function. Combination of things that happened 2000-2013 moving into house didn't like. Didn't want to move. Taking care of mom's Alzheimer and feeling guilty of putting her into a home because she was  dangerous. Last two dogs died within two months of each other. At her age couldn't take anymore animals. (Doesn't have children, family, kids once her husband goes that will be it patient will be on her own-Not sure what she will do) Dad had heart attack at 52 and died of brain cancer at 72. Culmination of things that  ended up in panic attack. Never had responsibilities, didn't take care of things for her from 2000-2013 was a lot. The things that happened are over.  Therapist pointed out the core believes that form may be driving the panic and helpful to uncover the source can help with symptoms.  Patient shares does think has insight about things though not able to apply. One of the reasons she identifies "it is just the compulsions."  Suicidal/Homicidal: No  Therapist Response: Therapist reviewed symptoms, facilitated expression of thoughts and feelings noted positive efforts patient is taking to work on her compulsive behavior.  Focused on other compulsive behavior in session, ways to change compulsive shopping.  Reviewed redirecting energy, reviewed emotional regulation to slow her responses down so she would have more helpful responses, would help for self talk to be possible where she could question consequences, question the reports from shopping.  Therapist guided patient in recognizing more fulfilling meaningful reports coming through experiences.  Building this insight utilized motivational strategies to see pros and cons of shopping, patient herself noting spending too much money.  Discussed basic strategy of not giving into the urge with compulsions and noting anxiety decreases that nothing bad happen, noted the [employer] for patient and shopping is his only time she feels good why the need to develop other activities that would provide rewards without negative consequences and again more fulfilling and meaningful.  Explored underlying sources for both compulsive shopping and panic.  Noted uncovering the sources helps to change behaviors and symptoms.  Worked on developing insight that although patient is more isolated she can also still be her best company, needs to work through limitations from her mental health.  Therapist provided active listening, open questions supportive interventions  Plan: Return again in 1  week.2.  Therapist continue to work on strategies for compulsive shopping, explore underlying core beliefs that could be driving panic, coping  Diagnosis: Axis I: Major depressive disorder, recurrent, moderate, panic disorder with agoraphobia, generalized anxiety disorder, obsessive-compulsive disorder unspecified type    Axis II: No diagnosis    Coolidge Breeze, LCSW 05/01/2020

## 2020-05-09 ENCOUNTER — Ambulatory Visit (INDEPENDENT_AMBULATORY_CARE_PROVIDER_SITE_OTHER): Payer: Medicare Other | Admitting: Licensed Clinical Social Worker

## 2020-05-09 DIAGNOSIS — F4001 Agoraphobia with panic disorder: Secondary | ICD-10-CM | POA: Diagnosis not present

## 2020-05-09 DIAGNOSIS — F411 Generalized anxiety disorder: Secondary | ICD-10-CM | POA: Diagnosis not present

## 2020-05-09 DIAGNOSIS — F429 Obsessive-compulsive disorder, unspecified: Secondary | ICD-10-CM | POA: Diagnosis not present

## 2020-05-09 DIAGNOSIS — F331 Major depressive disorder, recurrent, moderate: Secondary | ICD-10-CM | POA: Diagnosis not present

## 2020-05-09 NOTE — Progress Notes (Signed)
Virtual Visit via Telephone Note  I connected with Kimberly Noble on 05/09/20 at 11:00 AM EST by telephone and verified that I am speaking with the correct person using two identifiers.  Location: Patient: home Provider: home office   I discussed the limitations, risks, security and privacy concerns of performing an evaluation and management service by telephone and the availability of in person appointments. I also discussed with the patient that there may be a patient responsible charge related to this service. The patient expressed understanding and agreed to proceed.   I discussed the assessment and treatment plan with the patient. The patient was provided an opportunity to ask questions and all were answered. The patient agreed with the plan and demonstrated an understanding of the instructions.   The patient was advised to call back or seek an in-person evaluation if the symptoms worsen or if the condition fails to improve as anticipated.  I provided 53 minutes of non-face-to-face time during this encounter.   THERAPIST PROGRESS NOTE  Session Time: 11:00 AM to 11:53 AM  Participation Level: Active  Behavioral Response: CasualAlertAnxious and Depressed  Type of Therapy: Individual Therapy  Treatment Goals addressed:  Patient be able to go into another room, to be able to get into her car and go to the grocery store, decreased depressive symptoms, coping Interventions: CBT, Solution Focused, Strength-based, Supportive and Other: coping  Summary: Kimberly Noble is a 75 y.o. female who presents with was really depressed feeling lonely, by herself, can't think anything to replace shopping. Her friend helping to box up things to return that doesn't even remember buying. Didn't compulsively shop until after menopause. "menopause psychosis" when compulsive shopping "binging and purging." Friend put big notes on computer not to use, "like that is going to stop but I am trying".  She goes into a trance where forgets everything anything but act of shopping and acting. Takes her mind off of things. Notices less of thrill less likely to wear make up and clothes. When clinically depressed it is hard to find pleasure in anything. Patient went on to talk about treatment for her depression. She has tried every thing, tried antidrepresant but come to the conclusion nothing helped and told can't help her. Brain shares brain is wired so makes it hard. Depressed as a child. Feels depression not related to isolating or withdraw before she would go places and still depressed. Has nothing to be depressed about except the anxiety and panic.  Explored whether she wants to continue with therapist given patient's feelings she has tried and nothing is help. She was told too many disorders and can't help her. Not for lack of trying. Reviewed working on panic and patient describes ways she is stuck go up to clean cat liter hyperventilating, all signs of panic attack, hear pounding out of chest, dizziness, leg rubbery. Just doing that. Medicine hasn't helped. All doing is cleaning the litter can't stop the panic attack. Knows it is not dangerous but still has panic. Knows it will stop after an hour after it happens. Tell herself won't have panic but still happens. Doing things with this therapist that tried so many times already to a point where providers tell her they can't help her told that 4-5 times. Patient shares couldn't help friend yesterday was so bad, sleep all the time to escape, feels she has untreatable depression. Not that haven't tried nothing works.  Therapist reviewed the dehabituation strategy for anxiety as a starting point to help  work on isolation caused by anxiety. She she relates that does not work.  All she can do is keep trying. Good at putting things away at subconscious not facing.  Thanks what happened when she developed panic in 2013 could have triggered the panic causing the panic.   Shares does not help happened may be nothing to change it.  Therapist encourage patient in possibility of inaccurate thoughts developed at that time driving panic and if uncovered could be challenged for an helpfulness and inaccuracy.   Therapist reviewed symptoms, facilitated expression of thoughts and feelings and work with her shopping compulsion as an addiction, noting substitute behaviors is helpful also diminishing reward that she gets from the activity, noting she does not wear the close, does not have longer term fulfillment, noted negative consequences as motivating to change behavior.  Noted depression as a source of behavior as this is the only thing that is positive.  Therapist provided education on factors that keep depression going in addressing them helps improve symptoms such as withdrawal and isolation that are the case with patient.  Noted anxiety as preventing her from more activity so looking at that to help , we can target and make positive changes, look at it as a cog that is keeping the negative cycle going and if we focus on can slow down and change negative pattern.  Looked at again strategies for anxiety including dehabituation the more she does something for less anxiety it produces.  Also looking at thoughts that are feeding anxiety and challenging them for helpfulness.  Uncovered patient tends to store things and unconscious that probably is keeping the anxiety going, therapist pointed out core belief develop from period of time help to uncover what she saying to herself and challenge for inaccuracies.  We will continue to explore with her as uncovering thought may help stop one of the central drivers of her panic.  Therapist provided active listening open questions supportive interventions  Suicidal/Homicidal: No  Plan: Return again in 4 weeks.2.Therapist continue to work on strategies for compulsive shopping, explore underlying core beliefs that could be driving panic,  coping  Diagnosis: Axis I:  Major depressive disorder, recurrent, moderate, panic disorder with agoraphobia, generalized anxiety disorder, obsessive-compulsive disorder unspecified type    Axis II: No diagnosis    Coolidge Breeze, LCSW 05/09/2020

## 2020-06-13 ENCOUNTER — Ambulatory Visit (HOSPITAL_COMMUNITY): Payer: Medicare Other | Admitting: Licensed Clinical Social Worker

## 2021-06-10 ENCOUNTER — Emergency Department (HOSPITAL_BASED_OUTPATIENT_CLINIC_OR_DEPARTMENT_OTHER): Payer: Medicare Other

## 2021-06-10 ENCOUNTER — Other Ambulatory Visit: Payer: Self-pay

## 2021-06-10 ENCOUNTER — Encounter (HOSPITAL_BASED_OUTPATIENT_CLINIC_OR_DEPARTMENT_OTHER): Payer: Self-pay

## 2021-06-10 ENCOUNTER — Observation Stay (HOSPITAL_BASED_OUTPATIENT_CLINIC_OR_DEPARTMENT_OTHER)
Admission: EM | Admit: 2021-06-10 | Discharge: 2021-06-11 | Disposition: A | Discharge status: Home / Self Care | Payer: Medicare Other | Attending: Internal Medicine | Admitting: Internal Medicine

## 2021-06-10 DIAGNOSIS — R55 Syncope and collapse: Principal | ICD-10-CM | POA: Diagnosis present

## 2021-06-10 DIAGNOSIS — E876 Hypokalemia: Secondary | ICD-10-CM

## 2021-06-10 DIAGNOSIS — F32A Depression, unspecified: Secondary | ICD-10-CM | POA: Diagnosis present

## 2021-06-10 DIAGNOSIS — F17210 Nicotine dependence, cigarettes, uncomplicated: Secondary | ICD-10-CM | POA: Insufficient documentation

## 2021-06-10 DIAGNOSIS — Z20822 Contact with and (suspected) exposure to covid-19: Secondary | ICD-10-CM | POA: Insufficient documentation

## 2021-06-10 DIAGNOSIS — F411 Generalized anxiety disorder: Secondary | ICD-10-CM | POA: Diagnosis present

## 2021-06-10 DIAGNOSIS — Z7982 Long term (current) use of aspirin: Secondary | ICD-10-CM | POA: Insufficient documentation

## 2021-06-10 DIAGNOSIS — Z79899 Other long term (current) drug therapy: Secondary | ICD-10-CM | POA: Diagnosis not present

## 2021-06-10 LAB — HEPATIC FUNCTION PANEL
ALT: 29 U/L (ref 0–44)
AST: 31 U/L (ref 15–41)
Albumin: 4.2 g/dL (ref 3.5–5.0)
Alkaline Phosphatase: 45 U/L (ref 38–126)
Bilirubin, Direct: <0.1 mg/dL (ref 0.0–0.2)
Total Bilirubin: 0.5 mg/dL (ref 0.3–1.2)
Total Protein: 7.6 g/dL (ref 6.5–8.1)

## 2021-06-10 LAB — TROPONIN I (HIGH SENSITIVITY)
Troponin I (High Sensitivity): 3 ng/L (ref <18)
Troponin I (High Sensitivity): 3 ng/L (ref <18)

## 2021-06-10 LAB — BASIC METABOLIC PANEL
Anion gap: 9 (ref 5–15)
BUN: 12 mg/dL (ref 8–23)
CO2: 23 mmol/L (ref 22–32)
Calcium: 8.7 mg/dL — ABNORMAL LOW (ref 8.9–10.3)
Chloride: 106 mmol/L (ref 98–111)
Creatinine, Ser: 0.92 mg/dL (ref 0.44–1.00)
GFR, Estimated: >60 mL/min (ref >60)
Glucose, Bld: 102 mg/dL — ABNORMAL HIGH (ref 70–99)
Potassium: 3.4 mmol/L — ABNORMAL LOW (ref 3.5–5.1)
Sodium: 138 mmol/L (ref 135–145)

## 2021-06-10 LAB — CBC
HCT: 44.3 % (ref 36.0–46.0)
Hemoglobin: 15.1 g/dL — ABNORMAL HIGH (ref 12.0–15.0)
MCH: 30.8 pg (ref 26.0–34.0)
MCHC: 34.1 g/dL (ref 30.0–36.0)
MCV: 90.2 fL (ref 80.0–100.0)
Platelets: 283 10*3/uL (ref 150–400)
RBC: 4.91 MIL/uL (ref 3.87–5.11)
RDW: 12.6 % (ref 11.5–15.5)
WBC: 14 10*3/uL — ABNORMAL HIGH (ref 4.0–10.5)
nRBC: 0 % (ref 0.0–0.2)

## 2021-06-10 LAB — D-DIMER, QUANTITATIVE: D-Dimer, Quant: 0.34 ug/mL-FEU (ref 0.00–0.50)

## 2021-06-10 LAB — LACTIC ACID, PLASMA
Lactic Acid, Venous: 1.1 mmol/L (ref 0.5–1.9)
Lactic Acid, Venous: 0.8 mmol/L (ref 0.5–1.9)

## 2021-06-10 LAB — RESP PANEL BY RT-PCR (FLU A&B, COVID) ARPGX2
Influenza A by PCR: NEGATIVE
Influenza B by PCR: NEGATIVE
SARS Coronavirus 2 by RT PCR: NEGATIVE

## 2021-06-10 MED ORDER — ONDANSETRON HCL 4 MG PO TABS: 4.0000 mg | ORAL_TABLET | Freq: Four times a day (QID) | ORAL | Status: DC | PRN

## 2021-06-10 MED ORDER — SODIUM CHLORIDE 0.9% FLUSH
3.0000 mL | Freq: Two times a day (BID) | INTRAVENOUS | Status: DC
  Administered 2021-06-10 – 2021-06-11 (×2): 3 mL via INTRAVENOUS

## 2021-06-10 MED ORDER — ALPRAZOLAM 1 MG PO TABS
1.0000 mg | ORAL_TABLET | Freq: Three times a day (TID) | ORAL | Status: DC
Start: 2021-06-10 — End: 2021-06-11
  Administered 2021-06-10 – 2021-06-11 (×2): 1 mg via ORAL
  Filled 2021-06-10 (×2): qty 1

## 2021-06-10 MED ORDER — ONDANSETRON HCL 4 MG/2ML IJ SOLN
4.0000 mg | Freq: Four times a day (QID) | INTRAMUSCULAR | Status: DC | PRN
Start: 2021-06-10 — End: 2021-06-11

## 2021-06-10 MED ORDER — POTASSIUM CHLORIDE CRYS ER 20 MEQ PO TBCR
20.0000 meq | EXTENDED_RELEASE_TABLET | Freq: Once | ORAL | Status: AC
  Administered 2021-06-10: 20 meq via ORAL
  Filled 2021-06-10: qty 1

## 2021-06-10 MED ORDER — ACETAMINOPHEN 650 MG RE SUPP: 650.0000 mg | Freq: Four times a day (QID) | RECTAL | Status: DC | PRN

## 2021-06-10 MED ORDER — LACTATED RINGERS IV SOLN: INTRAVENOUS | Status: DC

## 2021-06-10 MED ORDER — ACETAMINOPHEN 325 MG PO TABS: 650.0000 mg | ORAL_TABLET | Freq: Four times a day (QID) | ORAL | Status: DC | PRN

## 2021-06-10 MED ORDER — ENOXAPARIN SODIUM 40 MG/0.4ML IJ SOSY
40.0000 mg | PREFILLED_SYRINGE | INTRAMUSCULAR | Status: DC
  Administered 2021-06-10: 40 mg via SUBCUTANEOUS
  Filled 2021-06-10: qty 0.4

## 2021-06-10 MED ORDER — SENNOSIDES-DOCUSATE SODIUM 8.6-50 MG PO TABS: 1.0000 | ORAL_TABLET | Freq: Every evening | ORAL | Status: DC | PRN

## 2021-06-10 NOTE — ED Provider Notes (Signed)
[facility] EMERGENCY DEPARTMENT Provider Note   CSN: [id number] Arrival date & time: 06/10/21  1351     History  Chief Complaint  Patient presents with   Loss of Consciousness    Kimberly Noble is a 76 y.o. female.  HPI     76 year old female comes in with chief complaint of syncope  Patient has no significant medical history and her only medications include Xanax, which she has been taking for several months now and vitamins  She indicates that between 915 and 9:30 AM today, she likely passed out.  She remembers making lunch for her husband and then sitting down to watch TV.  Next thing she recalls she is back sitting on her couch, touching her face to see why she is bleeding and having pain over her face and lip.  There is no history of syncope, any cardiac disease history, seizures, strokes.  Patient is now complaining of some pain over her face, loose tooth.   Home Medications Prior to Admission medications   Medication Sig Start Date End Date Taking? Authorizing Provider  ALPRAZolam Duanne Moron) 1 MG tablet Take 1 mg by mouth daily.     [provider]  ALPRAZolam Duanne Moron) 1 MG tablet Take 1 tablet (1 mg total) by mouth daily. 04/25/16   McKeag, Marylynn Pearson, MD  aspirin EC 81 MG tablet Take 81 mg by mouth daily.    [provider]  Biotin w/ Vitamins C & E (HAIR/SKIN/NAILS PO) Take 1 tablet by mouth daily.    [provider]  calcium-vitamin D (OSCAL WITH D) 500-200 MG-UNIT tablet Take 1 tablet by mouth daily.     [provider]  CRANBERRY PO Take 1 tablet by mouth daily.    [provider]  gabapentin (NEURONTIN) 100 MG capsule Take 1 capsule (100 mg total) by mouth 3 (three) times daily. 03/07/20   Merian Capron, MD  LUTEIN PO Take 1 tablet by mouth daily.    [provider]  Multiple Vitamins-Minerals (MULTIVITAMIN WITH MINERALS) tablet Take 1 tablet by mouth daily.    [provider]  Omega-3 Fatty  Acids (FISH OIL PO) Take 1 capsule by mouth daily.    [provider]  vitamin C (ASCORBIC ACID) 500 MG tablet Take 500 mg by mouth daily.    [provider]  vitamin E 100 UNIT capsule Take 100 Units by mouth daily.    [provider]      Allergies    Penicillins    Review of Systems   Review of Systems  All other systems reviewed and are negative.  Physical Exam Updated Vital Signs BP 117/68    Pulse 92    Temp 98.2 F (36.8 C) (Oral)    Resp (!) 21    Ht 4\' 11"  (1.499 m)    Wt 55.8 kg    SpO2 98%    BMI 24.84 kg/m  Physical Exam Vitals and nursing note reviewed.  Constitutional:      Appearance: She is well-developed.  HENT:     Head: Atraumatic.  Eyes:     Extraocular Movements: Extraocular movements intact.     Pupils: Pupils are equal, round, and reactive to light.  Cardiovascular:     Rate and Rhythm: Normal rate.  Pulmonary:     Effort: Pulmonary effort is normal.  Musculoskeletal:     Cervical back: Normal range of motion and neck supple.  Skin:    General: Skin is warm  and dry.     Findings: Bruising present.     Comments: Patient has bruising over her left lip and a superficial laceration over her left cheek  Neurological:     General: No focal deficit present.     Mental Status: She is alert and oriented to person, place, and time.     Cranial Nerves: No cranial nerve deficit.     Motor: No weakness.     Coordination: Coordination normal.    ED Results / Procedures / Treatments   Labs (all labs ordered are listed, but only abnormal results are displayed) Labs Reviewed  BASIC METABOLIC PANEL - Abnormal; Notable for the following components:      Result Value   Potassium 3.4 (*)    Glucose, Bld 102 (*)    Calcium 8.7 (*)    All other components within normal limits  CBC - Abnormal; Notable for the following components:   WBC 14.0 (*)    Hemoglobin 15.1 (*)    All other components within normal limits  RESP PANEL BY RT-PCR  (FLU A&B, COVID) ARPGX2  LACTIC ACID, PLASMA  URINALYSIS, ROUTINE W REFLEX MICROSCOPIC  HEPATIC FUNCTION PANEL  LACTIC ACID, PLASMA    EKG EKG Interpretation  Date/Time:  Monday June 10 2021 14:01:03 EST Ventricular Rate:  100 PR Interval:  156 QRS Duration: 68 QT Interval:  338 QTC Calculation: 436 R Axis:   20 Text Interpretation: Normal sinus rhythm Cannot rule out Anterior infarct , age undetermined Abnormal ECG When compared with ECG of 24-Apr-2016 15:13, PREVIOUS ECG IS PRESENT No acute changes No significant change since last tracing Confirmed by Varney Biles [phone]) on 06/10/2021 2:34:24 PM  Radiology No results found.  Procedures Procedures    Medications Ordered in ED Medications  lactated ringers infusion ( Intravenous New Bag/Given 06/10/21 1522)    ED Course/ Medical Decision Making/ A&P                           Medical Decision Making Amount and/or Complexity of Data Reviewed Labs: ordered. Radiology: ordered.  Risk Prescription drug management. Decision regarding hospitalization.   76 year old female comes in with chief complaint of syncope.  DDx includes: Orthostatic hypotension Stroke Vertebral artery dissection/stenosis Dysrhythmia PE Vasovagal/neurocardiogenic syncope Aortic stenosis Valvular disorder/Cardiomyopathy Seizure Subarachnoid hemorrhage Subdural hemorrhage Concussion Severe anemia  Patient has clear evidence of trauma to her face, however there is no trauma to her lip itself. She cannot recall any prodrome prior to syncope. She last recalls sitting on the couch to watch TV and then again finding herself on the couch with a bruise, but does not recall falling, does not even recall standing up from sitting position.  Unsure if she had a seizure or amnesia from concussion.    History is not suggestive of any risk factors for neurocardiogenic event or anemia.  Plan is to get basic lab work-up, CT scan of the brain and  reassess.  I anticipate that patient will benefit with admission to the hospital for syncope work-up, and perhaps an EEG can be completed while she is admitted as well.  Of note, patient was supposed to see an eye specialist because of visual disturbance in her right eye.  Although we discussed some of her symptoms, I do not think it needs emergent work-up or ophthalmology consultation.  She indicates that she is having blurry vision that is worse than usual over her right eye, symptoms have been present  for few days.  No eye pain.  Final Clinical Impression(s) / ED Diagnoses Final diagnoses:  Syncope and collapse    Rx / DC Orders ED Discharge Orders     None         Varney Biles, MD 06/10/21 1529

## 2021-06-10 NOTE — ED Notes (Signed)
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

## 2021-06-10 NOTE — ED Notes (Signed)
Pt aware a urine sample is needed. 

## 2021-06-10 NOTE — ED Notes (Signed)
Patient transported to CT 

## 2021-06-10 NOTE — ED Notes (Signed)
[facility] called to transport 

## 2021-06-10 NOTE — ED Notes (Signed)
[facility] at bedside 

## 2021-06-10 NOTE — Treatment Plan (Signed)
76 yo with hx depression, anxiety, panic disorder/agoraphobia who presented to the ED with syncopal event.  Apparently between 915 and 930, she "likely passed out" per EDP.  She remembered kaing lunch and then sitting down to watch TV.  The next thing she remembered is being back on her couch with pain over face and lips and wondering why she was bleeding.  No known hx syncope, cardiac disease, seizures, or strokes.  Notably is on xanax at home, has been taking several months.  Vitals with mild tachycardia and tachypnea that have resolved.  Satting normally on room air.  Blood pressures within normal limits.  Labs notable for mild leukocytosis, mild hypokalemia, mild hypocalcemia.  Troponin negative x1 (~6 hrs after event).  D dimer is negative.  CXR is without active cardiopulm disease, CT head without acute abnormalities.  EKG showed sinus rhythm.  Requested CT maxillofacial with concern for facial pain, loose tooth.  Will admit to observation tele at [facility] for further evaluation of syncopal episode, consider EEG?  Amnesia from concussion vs post ictal?

## 2021-06-10 NOTE — Progress Notes (Signed)
Received patient into 1431 from [facility]. Vital signs taken. Patient alert and oriented x 4. Patient oriented to call bell and room. Telemetry started and IV Fluids infusing.

## 2021-06-10 NOTE — ED Triage Notes (Signed)
Per pt's neighbor-pt probably blacked out in her BR ~930am-husband heard a loud noise/did not check on pt at that time-pt has no recall of event-denies pain-has cut to top lip and left side of face-NAD-steady gait

## 2021-06-10 NOTE — H&P (Signed)
History and Physical    [name] HKF:[id number] DOB: Sep 15, 1945 DOA: 06/10/2021  PCP: Patient, No Pcp Per (Inactive)   Patient coming from: Home   Chief Complaint: Loss of consciousness   HPI: Kimberly Noble is a pleasant 76 y.o. female with medical history significant for anxiety, depression, and panic with agoraphobia, now presenting to the emergency department after a transient loss of consciousness.  Patient reports that she was in her usual state of health this morning and was going about her usual activities when she noticed some bleeding from her face and brief confusion.  Her husband had heard a loud noise that he suspected was a tree falling outside.  Patient remembers sitting on her couch watching TV, and was sitting in the same place again when she noticed the bleeding, but believes that there was a period of 5 or fewer minutes that she is unable to account for.  When she noticed the bleeding, she got up to go look in the bathroom mirror, but initially went to the closet instead.  This confusion resolved quickly and she felt back to her baseline.  There was no incontinence or tongue bite and aside from some mild pain and bleeding from her lip and under the left eye, she denies any pain or appreciable injury.  This has never happened to her previously.  She had not missed any doses of her Xanax and does not use alcohol.  She has had dizzy spells in the past but does not remember experiencing any lightheadedness today.  She has not had any chest pain or palpitations.  [facility] ED Course: Upon arrival to the ED, patient is found to be afebrile and saturating well on room air with stable blood pressure.  EKG features sinus rhythm and chest x-ray negative for acute cardiopulmonary disease.  Head CT negative for acute intracranial abnormality and maxillofacial CT negative for acute fracture.  Chemistry panel with mild hypokalemia and CBC notable for leukocytosis to 14,000.  Troponin was  normal x2 and lactic acid also normal.  D-dimer was low.  Patient was started on IV fluids and transported to [facility] to be observed.  Review of Systems:  All other systems reviewed and apart from HPI, are negative.  Past Medical History:  Diagnosis Date   Agoraphobia with panic attacks    Anxiety attack    Major depression    Superficial laceration of hand ~ 03/2015   left thumb    Past Surgical History:  Procedure Laterality Date   TONSILLECTOMY  1950's    Social History:   reports that she has been smoking cigarettes. She has a 13.20 pack-year smoking history. She has never used smokeless tobacco. She reports that she does not drink alcohol and does not use drugs.  Allergies  Allergen Reactions   Penicillins Rash    Has patient had a PCN reaction causing immediate rash, facial/tongue/throat swelling, SOB or lightheadedness with hypotension: Yes Has patient had a PCN reaction causing severe rash involving mucus membranes or skin necrosis: No Has patient had a PCN reaction that required hospitalization No Has patient had a PCN reaction occurring within the last 10 years: No If all of the above answers are "NO", then may proceed with Cephalosporin use.     History reviewed. No pertinent family history.   Prior to Admission medications   Medication Sig Start Date End Date Taking? Authorizing Provider  ALPRAZolam Prudy Feeler) 1 MG tablet Take 1 mg by mouth daily.  [provider]  ALPRAZolam Duanne Moron) 1 MG tablet Take 1 tablet (1 mg total) by mouth daily. 04/25/16   McKeag, Marylynn Pearson, MD  aspirin EC 81 MG tablet Take 81 mg by mouth daily.    [provider]  Biotin w/ Vitamins C & E (HAIR/SKIN/NAILS PO) Take 1 tablet by mouth daily.    [provider]  calcium-vitamin D (OSCAL WITH D) 500-200 MG-UNIT tablet Take 1 tablet by mouth daily.     [provider]  CRANBERRY PO Take 1 tablet by mouth daily.    [provider]   gabapentin (NEURONTIN) 100 MG capsule Take 1 capsule (100 mg total) by mouth 3 (three) times daily. 03/07/20   Merian Capron, MD  LUTEIN PO Take 1 tablet by mouth daily.    [provider]  Multiple Vitamins-Minerals (MULTIVITAMIN WITH MINERALS) tablet Take 1 tablet by mouth daily.    [provider]  Omega-3 Fatty Acids (FISH OIL PO) Take 1 capsule by mouth daily.    [provider]  vitamin C (ASCORBIC ACID) 500 MG tablet Take 500 mg by mouth daily.    [provider]  vitamin E 100 UNIT capsule Take 100 Units by mouth daily.    [provider]    Physical Exam: Vitals:   06/10/21 1645 06/10/21 1730 06/10/21 1745 06/10/21 1842  BP: 119/67 128/65 139/64 122/79  Pulse: 68 63 62 71  Resp: 13 14 13 17   Temp:    98.5 F (36.9 C)  TempSrc:    Oral  SpO2: 96% 98% 99% 98%  Weight:      Height:        Constitutional: NAD, calm  Eyes: PERTLA, lids and conjunctivae normal ENMT: Mucous membranes are moist. Posterior pharynx clear of any exudate or lesions.   Neck: supple, no masses  Respiratory: cno wheezing, no crackles. No accessory muscle use.  Cardiovascular: S1 & S2 heard, regular rate and rhythm. No extremity edema.   Abdomen: No distension, no tenderness, soft. Bowel sounds active.  Musculoskeletal: no clubbing / cyanosis. No joint deformity upper and lower extremities.   Skin: small ecchymosis and laceration under left eye. Warm, dry, well-perfused. Neurologic: CN 2-12 grossly intact. Sensation intact. Moving all extremities. Alert and fully oriented.  Psychiatric: Very pleasant. Cooperative.    Labs and Imaging on Admission: I have personally reviewed following labs and imaging studies  CBC: Recent Labs  Lab 06/10/21 1435  WBC 14.0*  HGB 15.1*  HCT 44.3  MCV 90.2  PLT [id number]   Basic Metabolic Panel: Recent Labs  Lab 06/10/21 1435  NA 138  K 3.4*  CL 106  CO2 23  GLUCOSE 102*  BUN 12  CREATININE 0.92  CALCIUM 8.7*    GFR: Estimated Creatinine Clearance: 40.8 mL/min (by C-G formula based on SCr of 0.92 mg/dL). Liver Function Tests: Recent Labs  Lab 06/10/21 1435  AST 31  ALT 29  ALKPHOS 45  BILITOT 0.5  PROT 7.6  ALBUMIN 4.2   No results for input(s): LIPASE, AMYLASE in the last 168 hours. No results for input(s): AMMONIA in the last 168 hours. Coagulation Profile: No results for input(s): INR, PROTIME in the last 168 hours. Cardiac Enzymes: No results for input(s): CKTOTAL, CKMB, CKMBINDEX, TROPONINI in the last 168 hours. BNP (last 3 results) No results for input(s): PROBNP in the last 8760 hours. HbA1C: No results for input(s): HGBA1C in the last 72 hours. CBG: No results for input(s): GLUCAP in the last  168 hours. Lipid Profile: No results for input(s): CHOL, HDL, LDLCALC, TRIG, CHOLHDL, LDLDIRECT in the last 72 hours. Thyroid Function Tests: No results for input(s): TSH, T4TOTAL, FREET4, T3FREE, THYROIDAB in the last 72 hours. Anemia Panel: No results for input(s): VITAMINB12, FOLATE, FERRITIN, TIBC, IRON, RETICCTPCT in the last 72 hours. Urine analysis:    Component Value Date/Time   COLORURINE YELLOW 04/24/2016 2117   APPEARANCEUR CLEAR 04/24/2016 2117   LABSPEC 1.020 04/24/2016 2117   PHURINE 5.0 04/24/2016 2117   GLUCOSEU NEGATIVE 04/24/2016 2117   HGBUR SMALL (A) 04/24/2016 2117   BILIRUBINUR NEGATIVE 04/24/2016 2117   KETONESUR 80 (A) 04/24/2016 2117   PROTEINUR 100 (A) 04/24/2016 2117   NITRITE NEGATIVE 04/24/2016 2117   LEUKOCYTESUR NEGATIVE 04/24/2016 2117   Sepsis Labs: @LABRCNTIP (procalcitonin:4,lacticidven:4) ) Recent Results (from the past 240 hour(s))  Resp Panel by RT-PCR (Flu A&B, Covid) Nasopharyngeal Swab     Status: None   Collection Time: 06/10/21  3:25 PM   Specimen: Nasopharyngeal Swab; Nasopharyngeal(NP) swabs in vial transport medium  Result Value Ref Range Status   SARS Coronavirus 2 by RT PCR NEGATIVE NEGATIVE Final    Comment:  (NOTE) SARS-CoV-2 target nucleic acids are NOT DETECTED.  The SARS-CoV-2 RNA is generally detectable in upper respiratory specimens during the acute phase of infection. The lowest concentration of SARS-CoV-2 viral copies this assay can detect is 138 copies/mL. A negative result does not preclude SARS-Cov-2 infection and should not be used as the sole basis for treatment or other patient management decisions. A negative result may occur with  improper specimen collection/handling, submission of specimen other than nasopharyngeal swab, presence of viral mutation(s) within the areas targeted by this assay, and inadequate number of viral copies(<138 copies/mL). A negative result must be combined with clinical observations, patient history, and epidemiological information. The expected result is Negative.  Fact Sheet for Patients:  EntrepreneurPulse.com.au  Fact Sheet for Healthcare Providers:  IncredibleEmployment.be  This test is no t yet approved or cleared by the [place] FDA and  has been authorized for detection and/or diagnosis of SARS-CoV-2 by FDA under an Emergency Use Authorization (EUA). This EUA will remain  in effect (meaning this test can be used) for the duration of the COVID-19 declaration under Section 564(b)(1) of the Act, 21 U.S.C.section 360bbb-3(b)(1), unless the authorization is terminated  or revoked sooner.       Influenza A by PCR NEGATIVE NEGATIVE Final   Influenza B by PCR NEGATIVE NEGATIVE Final    Comment: (NOTE) The Xpert Xpress SARS-CoV-2/FLU/RSV plus assay is intended as an aid in the diagnosis of influenza from Nasopharyngeal swab specimens and should not be used as a sole basis for treatment. Nasal washings and aspirates are unacceptable for Xpert Xpress SARS-CoV-2/FLU/RSV testing.  Fact Sheet for Patients: EntrepreneurPulse.com.au  Fact Sheet for Healthcare  Providers: IncredibleEmployment.be  This test is not yet approved or cleared by the [place] FDA and has been authorized for detection and/or diagnosis of SARS-CoV-2 by FDA under an Emergency Use Authorization (EUA). This EUA will remain in effect (meaning this test can be used) for the duration of the COVID-19 declaration under Section 564(b)(1) of the Act, 21 U.S.C. section 360bbb-3(b)(1), unless the authorization is terminated or revoked.  Performed at [facility], [address] [place], [place] [address]      Radiological Exams on Admission: DG Chest 2 View  Result Date: 06/10/2021 CLINICAL DATA:  Syncope. EXAM: CHEST - 2 VIEW COMPARISON:  October 02, 2006. FINDINGS: The heart size and mediastinal contours are within normal limits. Both lungs are clear. The visualized skeletal structures are unremarkable. IMPRESSION: No active cardiopulmonary disease. Electronically Signed   By: Marijo Conception M.D.   On: 06/10/2021 15:55   CT Head Wo Contrast  Result Date: 06/10/2021 CLINICAL DATA:  Head trauma, moderate-severe EXAM: CT HEAD WITHOUT CONTRAST TECHNIQUE: Contiguous axial images were obtained from the base of the skull through the vertex without intravenous contrast. RADIATION DOSE REDUCTION: This exam was performed according to the departmental dose-optimization program which includes automated exposure control, adjustment of the mA and/or kV according to patient size and/or use of iterative reconstruction technique. COMPARISON:  None. BRAIN: BRAIN Cerebral ventricle sizes are concordant with the degree of cerebral volume loss. Patchy and confluent areas of decreased attenuation are noted throughout the deep and periventricular white matter of the cerebral hemispheres bilaterally, compatible with chronic microvascular ischemic disease. No evidence of large-territorial acute infarction. No parenchymal hemorrhage. No mass lesion. No extra-axial collection. No  mass effect or midline shift. No hydrocephalus. Basilar cisterns are patent. Vascular: No hyperdense vessel. Skull: No acute fracture or focal lesion. Sinuses/Orbits: Paranasal sinuses and mastoid air cells are clear. The orbits are unremarkable. Other: None. IMPRESSION: No acute intracranial abnormality. Electronically Signed   By: Iven Finn M.D.   On: 06/10/2021 15:23   CT Maxillofacial Wo Contrast  Result Date: 06/10/2021 CLINICAL DATA:  Facial trauma, blunt EXAM: CT MAXILLOFACIAL WITHOUT CONTRAST TECHNIQUE: Multidetector CT imaging of the maxillofacial structures was performed. Multiplanar CT image reconstructions were also generated. RADIATION DOSE REDUCTION: This exam was performed according to the departmental dose-optimization program which includes automated exposure control, adjustment of the mA and/or kV according to patient size and/or use of iterative reconstruction technique. COMPARISON:  CT head 06/10/2021 FINDINGS: Osseous: No fracture or mandibular dislocation. No destructive process. Orbits: Negative. No traumatic or inflammatory finding. Sinuses: Clear. Soft tissues: Negative. Limited intracranial: No significant or unexpected finding. Other: Visualized upper cervical spine demonstrates no acute displaced fracture or traumatic listhesis. IMPRESSION: No acute facial fracture. Electronically Signed   By: Iven Finn M.D.   On: 06/10/2021 17:34    EKG: Independently reviewed. Sinus rhythm.   Assessment/Plan  1. Transient LOC  - Presents after transient LOC with amnesia to event and mild confusion after, now back to baseline  - Head CT negative, orthostatic vitals negative, and no blocks, long QT, or bradycardia on EKG  - Continue cardiac monitoring, check EEG and echocardiogram   2. Anxiety - Continue Xanax    DVT prophylaxis: Lovenox  Code Status: Full  Level of Care: Level of care: Telemetry Family Communication: none present  Disposition Plan:  Patient is from:  home  Anticipated d/c is to: home  Anticipated d/c date is: 2/28 or 06/12/21 Patient currently: Pending EEG, cardiac monitoring, echocardiogram  Consults called: none  Admission status: Observation     Vianne Bulls, MD [facility]  06/10/2021, 9:05 PM

## 2021-06-11 ENCOUNTER — Observation Stay (HOSPITAL_BASED_OUTPATIENT_CLINIC_OR_DEPARTMENT_OTHER): Payer: Medicare Other

## 2021-06-11 DIAGNOSIS — F32A Depression, unspecified: Secondary | ICD-10-CM

## 2021-06-11 DIAGNOSIS — E876 Hypokalemia: Secondary | ICD-10-CM

## 2021-06-11 DIAGNOSIS — R55 Syncope and collapse: Secondary | ICD-10-CM | POA: Diagnosis not present

## 2021-06-11 DIAGNOSIS — F411 Generalized anxiety disorder: Secondary | ICD-10-CM | POA: Diagnosis not present

## 2021-06-11 LAB — GLUCOSE, CAPILLARY: Glucose-Capillary: 94 mg/dL (ref 70–99)

## 2021-06-11 LAB — ECHOCARDIOGRAM COMPLETE
AV Mean grad: 4 mmHg
AV Peak grad: 7.4 mmHg
Ao pk vel: 1.36 m/s
Area-P 1/2: 4.21 cm2
Height: 59 in
P 1/2 time: 484 msec
S' Lateral: 2.3 cm
Single Plane A4C EF: 78.5 %
Weight: 2275.15 oz

## 2021-06-11 LAB — URINALYSIS, ROUTINE W REFLEX MICROSCOPIC
Bilirubin Urine: NEGATIVE
Glucose, UA: NEGATIVE mg/dL
Hgb urine dipstick: NEGATIVE
Ketones, ur: NEGATIVE mg/dL
Leukocytes,Ua: NEGATIVE
Nitrite: NEGATIVE
Protein, ur: NEGATIVE mg/dL
Specific Gravity, Urine: 1.005 (ref 1.005–1.030)
pH: 6 (ref 5.0–8.0)

## 2021-06-11 LAB — BASIC METABOLIC PANEL
Anion gap: 5 (ref 5–15)
BUN: 11 mg/dL (ref 8–23)
CO2: 24 mmol/L (ref 22–32)
Calcium: 8.7 mg/dL — ABNORMAL LOW (ref 8.9–10.3)
Chloride: 110 mmol/L (ref 98–111)
Creatinine, Ser: 0.8 mg/dL (ref 0.44–1.00)
GFR, Estimated: >60 mL/min (ref >60)
Glucose, Bld: 93 mg/dL (ref 70–99)
Potassium: 4 mmol/L (ref 3.5–5.1)
Sodium: 139 mmol/L (ref 135–145)

## 2021-06-11 LAB — CBC
HCT: 43.8 % (ref 36.0–46.0)
Hemoglobin: 14.2 g/dL (ref 12.0–15.0)
MCH: 30.3 pg (ref 26.0–34.0)
MCHC: 32.4 g/dL (ref 30.0–36.0)
MCV: 93.6 fL (ref 80.0–100.0)
Platelets: 241 10*3/uL (ref 150–400)
RBC: 4.68 MIL/uL (ref 3.87–5.11)
RDW: 12.9 % (ref 11.5–15.5)
WBC: 10.2 10*3/uL (ref 4.0–10.5)
nRBC: 0 % (ref 0.0–0.2)

## 2021-06-11 LAB — MAGNESIUM: Magnesium: 2.4 mg/dL (ref 1.7–2.4)

## 2021-06-11 MED ORDER — PERFLUTREN LIPID MICROSPHERE
1.0000 mL | INTRAVENOUS | Status: AC | PRN
  Administered 2021-06-11: 2 mL via INTRAVENOUS
  Filled 2021-06-11: qty 10

## 2021-06-11 NOTE — Discharge Summary (Signed)
Physician Discharge Summary   Patient: Kimberly Noble MRN: [id number] DOB: [birth year]  Admit date:     06/10/2021  Discharge date: 06/11/21  Discharge Physician: Joycelyn Das   PCP: Patient, No Pcp Per (Inactive)   Recommendations at discharge:   Follow-up with your primary care physician in 1 week.  Discharge Diagnoses: Active Problems:   Anxiety state   Depression   Hypokalemia  Principal Problem (Resolved):   Syncope   Hospital Course: Kimberly Noble is a 76 y.o. female with medical history significant for anxiety, depression, and panic with agoraphobia presented to hospital with loss of consciousness with facial trauma after the fall.  In the ED patient had a stable vitals.  Head CT was negative.  EKG with normal sinus rhythm.  Maxillofacial CT was negative for acute fracture.  Patient had mild leukocytosis but troponins were negative.  D-dimer was low.  Patient was given IV fluids and was admitted to the hospital for further observation.  Assessment and Plan: * Syncope-resolved as of 06/11/2021, (present on admission) Transient loss of consciousness with amnesia.  No baseline.  CT head scan negative.  Orthostatic vitals negative negative.  EKG unremarkable.  Troponins negative.  2D echocardiogram with preserved LV function.  Patient likely had a vasovagal episode.  Patient does have a history of anxiety and is on Xanax at home.  Spoke about orthostatic Precautions just in case.  Hypokalemia Mild.  Improved at this time after replacement to 4.0..  Anxiety state- (present on admission) On Xanax at home.   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 Claims she is not allergic to all shellfish.    Penicillins Rash   Has patient had a PCN reaction causing immediate rash, facial/tongue/throat swelling, SOB or lightheadedness with hypotension: Yes Has patient had a PCN reaction causing severe rash involving mucus membranes or skin necrosis: No Has patient had a PCN reaction that required hospitalization No Has patient had a PCN reaction occurring within the last 10 years: No If all of the above answers are "NO", then may proceed with Cephalosporin use.        Medication List     TAKE these medications    ALPRAZolam 1 MG tablet Commonly known as: XANAX Take 1 mg by mouth 3 (three) times daily. 7am, 1pm, 7pm   CALCIUM CARBONATE-VITAMIN D PO Take 1 capsule by mouth daily. supplement from [facility]   COQ10 PO Take 1 capsule by mouth daily.   CRANBERRY PO Take 1 capsule by mouth daily. supplement from [facility]   FISH OIL PO Take 1 capsule by mouth daily. supplement from [facility]   HAIR/SKIN/NAILS PO Take 1 capsule by mouth daily. supplement from [facility]   multivitamin with minerals Tabs tablet Take 1 tablet by mouth daily. supplement from [facility]   OVER THE COUNTER MEDICATION Take 1 capsule by mouth daily. Joint support supplement from [facility]   OVER THE COUNTER MEDICATION Take 1 capsule by mouth daily. Cholesterol care supplement from [facility]  OVER THE COUNTER MEDICATION Take 1 capsule by mouth daily. Circulation and vein support supplement from [facility]   OVER THE COUNTER MEDICATION Take 1 capsule by mouth daily. Green vegetable supplement from [employer]   OVER THE COUNTER MEDICATION Take 1 capsule by mouth daily. Liver and brain supplement from [facility]   OVER THE COUNTER MEDICATION Take 1 capsule by mouth daily. Eye vitamin with lutein - supplement from [facility]   VITAMIN C PO Take 1 capsule by mouth daily. supplement from [facility]   VITAMIN D3 PO Take 1  capsule by mouth daily. supplement from [facility]   VITAMIN K PO Take 1 capsule by mouth daily. supplement from [facility]       Subjective: Today, patient was seen and examined at bedside.  Patient feels well.  Has been able to ambulate well without any dizziness lightheadedness shortness of breath.  Discharge Exam: Filed Weights   06/10/21 1402 06/11/21 0538  Weight: 55.8 kg 64.5 kg   Vitals with BMI 06/11/2021 06/11/2021 06/11/2021  Height - - -  Weight - - 142 lbs 3 oz  BMI - - 28.7  Systolic 125 116 -  Diastolic 61 58 -  Pulse 65 58 -    General:  Average built, not in obvious distress HENT:   No scleral pallor or icterus noted. Oral mucosa is moist.  Some bruising over the lip and left periorbital area Chest:  Clear breath sounds.  Diminished breath sounds bilaterally. No crackles or wheezes.  CVS: S1 &S2 heard. No murmur.  Regular rate and rhythm. Abdomen: Soft, nontender, nondistended.  Bowel sounds are heard.   Extremities: No cyanosis, clubbing or edema.  Peripheral pulses are palpable. Psych: Alert, awake and oriented, normal mood CNS:  No cranial nerve deficits.  Power equal in all extremities.   Skin: Warm and dry.  No rashes noted.   Condition at discharge: good  The results of significant diagnostics from this hospitalization (including imaging, microbiology, ancillary and laboratory) are listed below for reference.   Imaging Studies: DG Chest 2 View  Result Date: 06/10/2021 CLINICAL DATA:  Syncope. EXAM: CHEST - 2 VIEW COMPARISON:  October 02, 2006. FINDINGS: The heart size and mediastinal contours are within normal limits. Both lungs are clear. The visualized skeletal structures are unremarkable. IMPRESSION: No active cardiopulmonary disease. Electronically Signed   By: Lupita Raider M.D.   On: 06/10/2021 15:55   CT Head Wo Contrast  Result Date: 06/10/2021 CLINICAL DATA:  Head trauma, moderate-severe EXAM: CT HEAD WITHOUT CONTRAST TECHNIQUE: Contiguous axial  images were obtained from the base of the skull through the vertex without intravenous contrast. RADIATION DOSE REDUCTION: This exam was performed according to the departmental dose-optimization program which includes automated exposure control, adjustment of the mA and/or kV according to patient size and/or use of iterative reconstruction technique. COMPARISON:  None. BRAIN: BRAIN Cerebral ventricle sizes are concordant with the degree of cerebral volume loss. Patchy and confluent areas of decreased attenuation are noted throughout the deep and periventricular white matter of the cerebral hemispheres bilaterally, compatible with chronic microvascular ischemic disease. No evidence of large-territorial acute infarction. No parenchymal hemorrhage. No mass lesion. No extra-axial collection. No mass effect or midline shift. No hydrocephalus. Basilar cisterns are patent. Vascular: No hyperdense vessel. Skull: No acute fracture or focal lesion. Sinuses/Orbits: Paranasal sinuses and mastoid air cells are clear. The orbits are unremarkable. Other: None. IMPRESSION: No acute intracranial abnormality. Electronically Signed   By: Blanchie Serve  Tessie FassNaveau M.D.   On: 06/10/2021 15:23   ECHOCARDIOGRAM COMPLETE  Result Date: 06/11/2021    ECHOCARDIOGRAM REPORT   Patient Name:   Kimberly Noble Date of Exam: 06/11/2021 Medical Rec #:  [id number]          Height:       59.0 in Accession #:    [id number]         Weight:       142.2 lb Date of Birth:  01/20/1946           BSA:          1.595 m Patient Age:    75 years           BP:           116/58 mmHg Patient Gender: F                  HR:           71 bpm. Exam Location:  Inpatient Procedure: 2D Echo, Intracardiac Opacification Agent, Cardiac Doppler and Color            Doppler Indications:    Syncope  History:        Patient has no prior history of Echocardiogram examinations.                 Signs/Symptoms:Syncope.  Sonographer:    Neomia DearAMARA CROWN RDCS Referring Phys: [id number] TIMOTHY S  OPYD  Sonographer Comments: Suboptimal apical window and suboptimal subcostal window. IMPRESSIONS  1. Left ventricular ejection fraction, by estimation, is 65 to 70%. The left ventricle has normal function. The left ventricle has no regional wall motion abnormalities. Left ventricular diastolic parameters are consistent with Grade I diastolic dysfunction (impaired relaxation).  2. Right ventricular systolic function is normal. The right ventricular size is normal.  3. The mitral valve is normal in structure. No evidence of mitral valve regurgitation. No evidence of mitral stenosis.  4. The aortic valve is tricuspid. Aortic valve regurgitation is mild. No aortic stenosis is present.  5. The inferior vena cava is normal in size with greater than 50% respiratory variability, suggesting right atrial pressure of 3 mmHg. Comparison(s): No prior Echocardiogram. FINDINGS  Left Ventricle: Left ventricular ejection fraction, by estimation, is 65 to 70%. The left ventricle has normal function. The left ventricle has no regional wall motion abnormalities. Definity contrast agent was given IV to delineate the left ventricular  endocardial borders. The left ventricular internal cavity size was normal in size. There is no left ventricular hypertrophy. Left ventricular diastolic parameters are consistent with Grade I diastolic dysfunction (impaired relaxation). Right Ventricle: The right ventricular size is normal. No increase in right ventricular wall thickness. Right ventricular systolic function is normal. Left Atrium: Left atrial size was normal in size. Right Atrium: Right atrial size was normal in size. Pericardium: Trivial pericardial effusion is present. Mitral Valve: The mitral valve is normal in structure. No evidence of mitral valve regurgitation. No evidence of mitral valve stenosis. Tricuspid Valve: The tricuspid valve is normal in structure. Tricuspid valve regurgitation is trivial. No evidence of tricuspid stenosis.  Aortic Valve: The aortic valve is tricuspid. Aortic valve regurgitation is mild. Aortic regurgitation PHT measures 484 msec. No aortic stenosis is present. Aortic valve mean gradient measures 4.0 mmHg. Aortic valve peak gradient measures 7.4 mmHg. Pulmonic Valve: The pulmonic valve was not well visualized. Pulmonic valve regurgitation is trivial. No evidence of pulmonic stenosis. Aorta: The aortic root and ascending aorta are  structurally normal, with no evidence of dilitation. Venous: The inferior vena cava is normal in size with greater than 50% respiratory variability, suggesting right atrial pressure of 3 mmHg. IAS/Shunts: No atrial level shunt detected by color flow Doppler.  LEFT VENTRICLE PLAX 2D LVIDd:         4.20 cm     Diastology LVIDs:         2.30 cm     LV e' medial:    6.20 cm/s LV PW:         0.80 cm     LV E/e' medial:  15.2 LV IVS:        0.90 cm     LV e' lateral:   7.72 cm/s                            LV E/e' lateral: 12.2  LV Volumes (MOD) LV vol d, MOD A4C: 32.3 ml LV vol s, MOD A4C: 7.0 ml LV SV MOD A4C:     32.3 ml RIGHT VENTRICLE RV Basal diam:  2.50 cm RV Mid diam:    1.30 cm RV S prime:     10.40 cm/s TAPSE (M-mode): 2.8 cm LEFT ATRIUM             Index        RIGHT ATRIUM           Index LA Vol (A2C):   18.2 ml 11.41 ml/m  RA Area:     10.50 cm LA Vol (A4C):   18.8 ml 11.78 ml/m  RA Volume:   21.90 ml  13.73 ml/m LA Biplane Vol: 18.5 ml 11.60 ml/m  AORTIC VALVE                   PULMONIC VALVE AV Vmax:           136.00 cm/s PV Vmax:          0.95 m/s AV Vmean:          89.600 cm/s PV Vmean:         62.550 cm/s AV VTI:            0.267 m     PV VTI:           0.196 m AV Peak Grad:      7.4 mmHg    PV Peak grad:     3.6 mmHg AV Mean Grad:      4.0 mmHg    PV Mean grad:     2.0 mmHg LVOT Vmax:         114.00 cm/s PR End Diast Vel: 2.41 msec LVOT Vmean:        74.400 cm/s LVOT VTI:          0.229 m LVOT/AV VTI ratio: 0.86 AI PHT:            484 msec  AORTA Ao Asc diam: 2.90 cm MITRAL  VALVE                TRICUSPID VALVE MV Area (PHT): 4.21 cm     TR Peak grad:   18.1 mmHg MV Decel Time: 180 msec     TR Vmax:        213.00 cm/s MV E velocity: 94.10 cm/s MV A velocity: 103.00 cm/s  SHUNTS MV E/A ratio:  0.91         Systemic VTI: 0.23 m [employer]  MD Electronically signed by Riley LamMahesh Chandrasekhar MD Signature Date/Time: 06/11/2021/12:41:59 PM    Final    CT Maxillofacial Wo Contrast  Result Date: 06/10/2021 CLINICAL DATA:  Facial trauma, blunt EXAM: CT MAXILLOFACIAL WITHOUT CONTRAST TECHNIQUE: Multidetector CT imaging of the maxillofacial structures was performed. Multiplanar CT image reconstructions were also generated. RADIATION DOSE REDUCTION: This exam was performed according to the departmental dose-optimization program which includes automated exposure control, adjustment of the mA and/or kV according to patient size and/or use of iterative reconstruction technique. COMPARISON:  CT head 06/10/2021 FINDINGS: Osseous: No fracture or mandibular dislocation. No destructive process. Orbits: Negative. No traumatic or inflammatory finding. Sinuses: Clear. Soft tissues: Negative. Limited intracranial: No significant or unexpected finding. Other: Visualized upper cervical spine demonstrates no acute displaced fracture or traumatic listhesis. IMPRESSION: No acute facial fracture. Electronically Signed   By: Tish FredericksonMorgane  Naveau M.D.   On: 06/10/2021 17:34    Microbiology: Results for orders placed or performed during the hospital encounter of 06/10/21  Resp Panel by RT-PCR (Flu A&B, Covid) Nasopharyngeal Swab     Status: None   Collection Time: 06/10/21  3:25 PM   Specimen: Nasopharyngeal Swab; Nasopharyngeal(NP) swabs in vial transport medium  Result Value Ref Range Status   SARS Coronavirus 2 by RT PCR NEGATIVE NEGATIVE Final    Comment: (NOTE) SARS-CoV-2 target nucleic acids are NOT DETECTED.  The SARS-CoV-2 RNA is generally detectable in upper respiratory specimens during the  acute phase of infection. The lowest concentration of SARS-CoV-2 viral copies this assay can detect is 138 copies/mL. A negative result does not preclude SARS-Cov-2 infection and should not be used as the sole basis for treatment or other patient management decisions. A negative result may occur with  improper specimen collection/handling, submission of specimen other than nasopharyngeal swab, presence of viral mutation(s) within the areas targeted by this assay, and inadequate number of viral copies(<138 copies/mL). A negative result must be combined with clinical observations, patient history, and epidemiological information. The expected result is Negative.  Fact Sheet for Patients:  BloggerCourse.comhttps://www.fda.gov/media/152166/download  Fact Sheet for Healthcare Providers:  SeriousBroker.ithttps://www.fda.gov/media/152162/download  This test is no t yet approved or cleared by the [place] FDA and  has been authorized for detection and/or diagnosis of SARS-CoV-2 by FDA under an Emergency Use Authorization (EUA). This EUA will remain  in effect (meaning this test can be used) for the duration of the COVID-19 declaration under Section 564(b)(1) of the Act, 21 U.S.C.section 360bbb-3(b)(1), unless the authorization is terminated  or revoked sooner.       Influenza A by PCR NEGATIVE NEGATIVE Final   Influenza B by PCR NEGATIVE NEGATIVE Final    Comment: (NOTE) The Xpert Xpress SARS-CoV-2/FLU/RSV plus assay is intended as an aid in the diagnosis of influenza from Nasopharyngeal swab specimens and should not be used as a sole basis for treatment. Nasal washings and aspirates are unacceptable for Xpert Xpress SARS-CoV-2/FLU/RSV testing.  Fact Sheet for Patients: BloggerCourse.comhttps://www.fda.gov/media/152166/download  Fact Sheet for Healthcare Providers: SeriousBroker.ithttps://www.fda.gov/media/152162/download  This test is not yet approved or cleared by the [place] FDA and has been authorized for detection and/or  diagnosis of SARS-CoV-2 by FDA under an Emergency Use Authorization (EUA). This EUA will remain in effect (meaning this test can be used) for the duration of the COVID-19 declaration under Section 564(b)(1) of the Act, 21 U.S.C. section 360bbb-3(b)(1), unless the authorization is terminated or revoked.  Performed at [facility] [place], [address] [place], [place] [address]  Labs: CBC: Recent Labs  Lab 06/10/21 1435 06/11/21 0338  WBC 14.0* 10.2  HGB 15.1* 14.2  HCT 44.3 43.8  MCV 90.2 93.6  PLT 283 241   Basic Metabolic Panel: Recent Labs  Lab 06/10/21 1435 06/11/21 0338  NA 138 139  K 3.4* 4.0  CL 106 110  CO2 23 24  GLUCOSE 102* 93  BUN 12 11  CREATININE 0.92 0.80  CALCIUM 8.7* 8.7*  MG  --  2.4   Liver Function Tests: Recent Labs  Lab 06/10/21 1435  AST 31  ALT 29  ALKPHOS 45  BILITOT 0.5  PROT 7.6  ALBUMIN 4.2   CBG: Recent Labs  Lab 06/11/21 0531  GLUCAP 94    Discharge time spent: greater than 30 minutes.  Signed: Joycelyn Das, MD [facility] 06/11/2021

## 2021-06-11 NOTE — Assessment & Plan Note (Addendum)
Mild.  Improved at this time after replacement to 4.0.Marland Kitchen

## 2021-06-11 NOTE — Assessment & Plan Note (Addendum)
Transient loss of consciousness with amnesia.  No baseline.  CT head scan negative.  Orthostatic vitals negative negative.  EKG unremarkable.  Troponins negative.  2D echocardiogram with preserved LV function.  Patient likely had a vasovagal episode.  Patient does have a history of anxiety and is on Xanax at home.  Spoke about orthostatic Precautions just in case.

## 2021-06-11 NOTE — Hospital Course (Signed)
Kimberly Noble is a 76 y.o. female with medical history significant for anxiety, depression, and panic with agoraphobia presented to hospital with loss of consciousness with facial trauma after the fall.  In the ED patient had a stable vitals.  Head CT was negative.  EKG with normal sinus rhythm.  Maxillofacial CT was negative for acute fracture.  Patient had mild leukocytosis but troponins were negative.  D-dimer was low.  Patient was given IV fluids and was admitted to the hospital for further observation.

## 2021-06-11 NOTE — Assessment & Plan Note (Signed)
On Xanax at home.

## 2021-06-18 NOTE — Progress Notes (Shared)
[facility] - Clinic Note  06/27/2021     CHIEF COMPLAINT Patient presents for No chief complaint on file.   HISTORY OF PRESENT ILLNESS: Kimberly Noble is a 76 y.o. female who presents to the clinic today for:     Referring physician: No referring provider defined for this encounter.  HISTORICAL INFORMATION:   Selected notes from the MEDICAL RECORD NUMBER Referred by Dr. Zenaida Niece for PED OD LEE:  Ocular Hx- PMH-    CURRENT MEDICATIONS: No current outpatient medications on file. (Ophthalmic Drugs)   No current facility-administered medications for this visit. (Ophthalmic Drugs)   Current Outpatient Medications (Other)  Medication Sig   ALPRAZolam (XANAX) 1 MG tablet Take 1 mg by mouth 3 (three) times daily. 7am, 1pm, 7pm   Ascorbic Acid (VITAMIN C PO) Take 1 capsule by mouth daily. supplement from [facility]   Biotin w/ Vitamins C & E (HAIR/SKIN/NAILS PO) Take 1 capsule by mouth daily. supplement from [facility]   CALCIUM CARBONATE-VITAMIN D PO Take 1 capsule by mouth daily. supplement from [facility]   Cholecalciferol (VITAMIN D3 PO) Take 1 capsule by mouth daily. supplement from [facility]   Coenzyme Q10 (COQ10 PO) Take 1 capsule by mouth daily.   CRANBERRY PO Take 1 capsule by mouth daily. supplement from [facility]   Multiple Vitamin (MULTIVITAMIN WITH MINERALS) TABS tablet Take 1 tablet by mouth daily. supplement from [facility]   Omega-3 Fatty Acids (FISH OIL PO) Take 1 capsule by mouth daily. supplement from [facility]   OVER THE COUNTER MEDICATION Take 1 capsule by mouth daily. Joint support supplement from [facility]   OVER THE COUNTER MEDICATION Take 1 capsule by mouth daily. Cholesterol care supplement from [facility]   OVER THE COUNTER MEDICATION Take 1 capsule by mouth daily. Circulation and vein support supplement from [facility]   OVER THE COUNTER MEDICATION Take 1 capsule by mouth daily. Green vegetable supplement from [employer]   OVER THE COUNTER MEDICATION  Take 1 capsule by mouth daily. Liver and brain supplement from [facility]   OVER THE COUNTER MEDICATION Take 1 capsule by mouth daily. Eye vitamin with lutein - supplement from [facility]   VITAMIN K PO Take 1 capsule by mouth daily. supplement from [facility]   No current facility-administered medications for this visit. (Other)      REVIEW OF SYSTEMS:    ALLERGIES Allergies  Allergen Reactions   Crab Extract Allergy Skin Test Anaphylaxis    Eating crab causes severe allergy. Claims she is not allergic to all shellfish.    Penicillins Rash    Has patient had a PCN reaction causing immediate rash, facial/tongue/throat swelling, SOB or lightheadedness with hypotension: Yes Has patient had a PCN reaction causing severe rash involving mucus membranes or skin necrosis: No Has patient had a PCN reaction that required hospitalization No Has patient had a PCN reaction occurring within the last 10 years: No If all of the above answers are "NO", then may proceed with Cephalosporin use.     PAST MEDICAL HISTORY Past Medical History:  Diagnosis Date   Agoraphobia with panic attacks    Anxiety attack    Major depression    Superficial laceration of hand ~ 03/2015   left thumb   Past Surgical History:  Procedure Laterality Date   TONSILLECTOMY  1950's    FAMILY HISTORY No family history on file.  SOCIAL HISTORY Social History   Tobacco Use  Smoking status: Every Day    Packs/day: 0.33    Years: 40.00    Pack years: 13.20    Types: Cigarettes   Smokeless tobacco: Never  Vaping Use   Vaping Use: Never used  Substance Use Topics   Alcohol use: No   Drug use: No         OPHTHALMIC EXAM:  Not recorded     IMAGING AND PROCEDURES  Imaging and Procedures for 06/27/2021           ASSESSMENT/PLAN:    ICD-10-CM   1. Retinal edema  H35.81       1.  2.  3.  Ophthalmic Meds Ordered this visit:  No orders of the defined types were placed in this  encounter.      No follow-ups on file.  There are no Patient Instructions on file for this visit.   Explained the diagnoses, plan, and follow up with the patient and they expressed understanding.  Patient expressed understanding of the importance of proper follow up care.   This document serves as a record of services personally performed by Karie Chimera, MD, PhD. It was created on their behalf by Glee Arvin. Manson Passey, OA an ophthalmic technician. The creation of this record is the provider's dictation and/or activities during the visit.    Electronically signed by: Glee Arvin. Manson Passey, [place] 03.07.2023 12:40 PM   Karie Chimera, M.D., Ph.D. Diseases & Surgery of the Retina and Vitreous [facility]      Abbreviations: M myopia (nearsighted); A astigmatism; H hyperopia (farsighted); P presbyopia; Mrx spectacle prescription;  CTL contact lenses; OD right eye; OS left eye; OU both eyes  XT exotropia; ET esotropia; PEK punctate epithelial keratitis; PEE punctate epithelial erosions; DES dry eye syndrome; MGD meibomian gland dysfunction; ATs artificial tears; PFAT's preservative free artificial tears; NSC nuclear sclerotic cataract; PSC posterior subcapsular cataract; ERM epi-retinal membrane; PVD posterior vitreous detachment; RD retinal detachment; DM diabetes mellitus; DR diabetic retinopathy; NPDR non-proliferative diabetic retinopathy; PDR proliferative diabetic retinopathy; CSME clinically significant macular edema; DME diabetic macular edema; dbh dot blot hemorrhages; CWS cotton wool spot; POAG primary open angle glaucoma; C/D cup-to-disc ratio; HVF humphrey visual field; GVF goldmann visual field; OCT optical coherence tomography; IOP intraocular pressure; BRVO Branch retinal vein occlusion; CRVO central retinal vein occlusion; CRAO central retinal artery occlusion; BRAO branch retinal artery occlusion; RT retinal tear; SB scleral buckle; PPV pars plana vitrectomy; VH  Vitreous hemorrhage; PRP panretinal laser photocoagulation; IVK intravitreal kenalog; VMT vitreomacular traction; MH Macular hole;  NVD neovascularization of the disc; NVE neovascularization elsewhere; AREDS age related eye disease study; ARMD age related macular degeneration; POAG primary open angle glaucoma; EBMD epithelial/anterior basement membrane dystrophy; ACIOL anterior chamber intraocular lens; IOL intraocular lens; PCIOL posterior chamber intraocular lens; Phaco/IOL phacoemulsification with intraocular lens placement; PRK photorefractive keratectomy; LASIK laser assisted in situ keratomileusis; HTN hypertension; DM diabetes mellitus; COPD chronic obstructive pulmonary disease

## 2021-06-27 ENCOUNTER — Other Ambulatory Visit: Payer: Self-pay

## 2021-06-27 ENCOUNTER — Ambulatory Visit (INDEPENDENT_AMBULATORY_CARE_PROVIDER_SITE_OTHER): Payer: Medicare Other | Admitting: Ophthalmology

## 2021-06-27 ENCOUNTER — Encounter (INDEPENDENT_AMBULATORY_CARE_PROVIDER_SITE_OTHER): Payer: Self-pay | Admitting: Ophthalmology

## 2021-06-27 DIAGNOSIS — H353122 Nonexudative age-related macular degeneration, left eye, intermediate dry stage: Secondary | ICD-10-CM

## 2021-06-27 DIAGNOSIS — H353211 Exudative age-related macular degeneration, right eye, with active choroidal neovascularization: Secondary | ICD-10-CM

## 2021-06-27 DIAGNOSIS — H35372 Puckering of macula, left eye: Secondary | ICD-10-CM | POA: Diagnosis not present

## 2021-06-27 DIAGNOSIS — H35033 Hypertensive retinopathy, bilateral: Secondary | ICD-10-CM

## 2021-06-27 DIAGNOSIS — I10 Essential (primary) hypertension: Secondary | ICD-10-CM

## 2021-06-27 DIAGNOSIS — H25813 Combined forms of age-related cataract, bilateral: Secondary | ICD-10-CM

## 2021-06-27 DIAGNOSIS — H3581 Retinal edema: Secondary | ICD-10-CM

## 2021-06-27 MED ORDER — BEVACIZUMAB CHEMO INJECTION 1.25MG/0.05ML SYRINGE FOR KALEIDOSCOPE
1.2500 mg | INTRAVITREAL | Status: AC | PRN
  Administered 2021-06-27: 1.25 mg via INTRAVITREAL

## 2021-07-10 ENCOUNTER — Encounter (INDEPENDENT_AMBULATORY_CARE_PROVIDER_SITE_OTHER): Payer: Medicare Other | Admitting: Ophthalmology

## 2021-07-11 ENCOUNTER — Ambulatory Visit (INDEPENDENT_AMBULATORY_CARE_PROVIDER_SITE_OTHER): Payer: Medicare Other | Admitting: Ophthalmology

## 2021-07-11 ENCOUNTER — Encounter (INDEPENDENT_AMBULATORY_CARE_PROVIDER_SITE_OTHER): Payer: Self-pay | Admitting: Ophthalmology

## 2021-07-11 DIAGNOSIS — I10 Essential (primary) hypertension: Secondary | ICD-10-CM | POA: Diagnosis not present

## 2021-07-11 DIAGNOSIS — H35372 Puckering of macula, left eye: Secondary | ICD-10-CM

## 2021-07-11 DIAGNOSIS — H353122 Nonexudative age-related macular degeneration, left eye, intermediate dry stage: Secondary | ICD-10-CM

## 2021-07-11 DIAGNOSIS — H353211 Exudative age-related macular degeneration, right eye, with active choroidal neovascularization: Secondary | ICD-10-CM | POA: Diagnosis not present

## 2021-07-11 DIAGNOSIS — H25813 Combined forms of age-related cataract, bilateral: Secondary | ICD-10-CM

## 2021-07-11 DIAGNOSIS — H35033 Hypertensive retinopathy, bilateral: Secondary | ICD-10-CM

## 2021-07-11 NOTE — Progress Notes (Signed)
?[facility] Note ? ?07/11/2021 ? ?  ? ?CHIEF COMPLAINT ?Patient presents for Retina Follow Up ? ? ?HISTORY OF PRESENT ILLNESS: ?Kimberly Noble is a 76 y.o. female who presents to the clinic today for:  ? ?HPI   ? ? Retina Follow Up   ?Patient presents with  Wet AMD.  In right eye.  This started 1 day ago.  I, the attending physician,  performed the HPI with the patient and updated documentation appropriately. ? ?  ?  ? ? Comments   ?Patient here for 1 day retina follow up for decrease vision OD. Patient states vision is horrible. Yesterday when watching tv all of a sudden everything in OD is wavy. The sphere in not as bad. Has broken spots that are white. Captions on tv are wavy. No eye pain. ? ?  ?  ?Last edited by Bernarda Caffey, MD on 07/15/2021  1:59 AM.  ?  ?Pt is s/p IVA OD #1 on 3.16.23. Pt states yesterday, 3.29.23, she woke up and turned on her TV, she states the vision out of her right eye everything was wavy, she states the people on the TV looked like "monsters", she states the road on the way in today was wavy as well ? ?Referring physician: ?No referring provider defined for this encounter. ? ?HISTORICAL INFORMATION:  ? ?Selected notes from the [facility] ?Referred by Dr. Lucianne Lei for PED OD ?LEE:  ?Ocular Hx- ?PMH- ?  ? ?CURRENT MEDICATIONS: ?No current outpatient medications on file. (Ophthalmic Drugs)  ? ?No current facility-administered medications for this visit. (Ophthalmic Drugs)  ? ?Current Outpatient Medications (Other)  ?Medication Sig  ? ALPRAZolam (XANAX) 1 MG tablet Take 1 mg by mouth 3 (three) times daily. 7am, 1pm, 7pm  ? Ascorbic Acid (VITAMIN C PO) Take 1 capsule by mouth daily. supplement from [facility]  ? Biotin w/ Vitamins C & E (HAIR/SKIN/NAILS PO) Take 1 capsule by mouth daily. supplement from [facility]  ? CALCIUM CARBONATE-VITAMIN D PO Take 1 capsule by mouth daily. supplement from [facility]  ? Cholecalciferol (VITAMIN D3 PO) Take 1 capsule by  mouth daily. supplement from [facility]  ? Coenzyme Q10 (COQ10 PO) Take 1 capsule by mouth daily.  ? CRANBERRY PO Take 1 capsule by mouth daily. supplement from [facility]  ? Multiple Vitamin (MULTIVITAMIN WITH MINERALS) TABS tablet Take 1 tablet by mouth daily. supplement from [facility]  ? Omega-3 Fatty Acids (FISH OIL PO) Take 1 capsule by mouth daily. supplement from [facility]  ? OVER THE COUNTER MEDICATION Take 1 capsule by mouth daily. Joint support supplement from [facility]  ? OVER THE COUNTER MEDICATION Take 1 capsule by mouth daily. Cholesterol care supplement from [facility]  ? OVER THE COUNTER MEDICATION Take 1 capsule by mouth daily. Circulation and vein support supplement from [facility]  ? OVER THE COUNTER MEDICATION Take 1 capsule by mouth daily. Green vegetable supplement from [employer]  ? OVER THE COUNTER MEDICATION Take 1 capsule by mouth daily. Liver and brain supplement from [facility]  ? OVER THE COUNTER MEDICATION Take 1 capsule by mouth daily. Eye vitamin with lutein - supplement from [facility]  ? VITAMIN K PO Take 1 capsule by mouth daily. supplement from [facility]  ? ?No current facility-administered medications for this visit. (Other)  ? ?REVIEW OF SYSTEMS: ?ROS   ?Positive for: Neurological, Eyes ?Negative for: Constitutional, Gastrointestinal, Skin, Genitourinary, Musculoskeletal, HENT,  Endocrine, Cardiovascular, Respiratory, Psychiatric, Allergic/Imm, Heme/Lymph ?Last edited by Theodore Demark, COA on 07/11/2021  9:38 AM.  ?  ? ?ALLERGIES ?Allergies  ?Allergen Reactions  ? Crab Extract Allergy Skin Test Anaphylaxis  ?  Eating crab causes severe allergy. Claims she is not allergic to all shellfish.   ? Penicillins Rash  ?  Has patient had a PCN reaction causing immediate rash, facial/tongue/throat swelling, SOB or lightheadedness with hypotension: Yes ?Has patient had a PCN reaction causing severe rash involving mucus membranes or skin necrosis: No ?Has patient had a PCN reaction that required  hospitalization No ?Has patient had a PCN reaction occurring within the last 10 years: No ?If all of the above answers are "NO", then may proceed with Cephalosporin use. ?  ? ?PAST MEDICAL HISTORY ?Past Medical History:  ?Diagnosis Date  ? Agoraphobia with panic attacks   ? Anxiety attack   ? Major depression   ? Superficial laceration of hand ~ 03/2015  ? left thumb  ? ?Past Surgical History:  ?Procedure Laterality Date  ? TONSILLECTOMY  1950's  ? ?FAMILY HISTORY ?Family History  ?Problem Relation Age of Onset  ? Macular degeneration Mother   ? ?SOCIAL HISTORY ?Social History  ? ?Tobacco Use  ? Smoking status: Every Day  ?  Packs/day: 0.33  ?  Years: 40.00  ?  Pack years: 13.20  ?  Types: Cigarettes  ? Smokeless tobacco: Never  ?Vaping Use  ? Vaping Use: Never used  ?Substance Use Topics  ? Alcohol use: No  ? Drug use: No  ?  ? ?  ?OPHTHALMIC EXAM: ? ?Base Eye Exam   ? ? Visual Acuity (Snellen - Linear)   ? ?   Right Left  ? Dist cc 20/70 20/40 +1  ? Dist ph cc NI 20/30 -2  ? ? Correction: Glasses  ? ?  ?  ? ? Tonometry (Tonopen, 9:33 AM)   ? ?   Right Left  ? Pressure 16 15  ? ?  ?  ? ? Pupils   ? ?   Dark Light Shape React APD  ? Right 4 3 Round Brisk None  ? Left 5 4 Round Brisk None  ? ?  ?  ? ? Visual Fields (Counting fingers)   ? ?   Left Right  ?  Full Full  ? ?  ?  ? ? Extraocular Movement   ? ?   Right Left  ?  Full, Ortho Full, Ortho  ? ?  ?  ? ? Neuro/Psych   ? ? Oriented x3: Yes  ? Mood/Affect: Normal  ? ?  ?  ? ? Dilation   ? ? Both eyes: 1.0% Mydriacyl, 2.5% Phenylephrine @ 9:33 AM  ? ?  ?  ? ?  ? ?Slit Lamp and Fundus Exam   ? ? Slit Lamp Exam   ? ?   Right Left  ? Lids/Lashes Dermatochalasis - upper lid, mild MGD Dermatochalasis - upper lid, mild MGD  ? Conjunctiva/Sclera White and quiet White and quiet  ? Cornea mild arcus, 2+ inferior Punctate epithelial erosions mild arcus, 2+ inferior Punctate epithelial erosions  ? Anterior Chamber Deep and quiet Deep and quiet  ? Iris Round and dilated Round  and dilated  ? Lens 2-3+ Nuclear sclerosis, 2-3+ Cortical cataract 2-3+ Nuclear sclerosis, 2-3+ Cortical cataract  ? Anterior Vitreous Mild syneresis Mild syneresis, Posterior vitreous detachment, vitreous condensations  ? ?  ?  ? ? Fundus Exam   ? ?  Right Left  ? Disc Pink and Sharp, temporal PPA Pink and Sharp, temporal PPA  ? C/D Ratio 0.5 0.5  ? Macula Blunted foveal reflex, interval improvement in PED height -- ?now RPE rip with inferior atrophy and superior pigment clumping, no frank heme Flat, Blunted foveal reflex, fine drusen, RPE mottling and clumping  ? Vessels attenuated, Tortuous mild attenuation, mild tortuosity  ? Periphery Attached, reticular degeneration, focal pigment clumping IT Attached, reticular degeneration, focal pigment clumping IT  ? ?  ?  ? ?  ? ?Refraction   ? ? Wearing Rx   ? ?   Sphere Cylinder Axis Add  ? Right -6.00 +1.75 117 +2.50  ? Left -5.25 +2.25 085 +2.50  ? ?  ?  ? ?  ? ?IMAGING AND PROCEDURES  ?Imaging and Procedures for 07/11/2021 ? ?OCT, Retina - OU - Both Eyes   ? ?   ?Right Eye ?Central Foveal Thickness: 471. Progression has improved. Findings include abnormal foveal contour, no IRF, subretinal hyper-reflective material, subretinal fluid, vitreomacular adhesion , pigment epithelial detachment (Interval improvement in edema with decreased PED/SRHM/SRF, significant central ORA).  ? ?Left Eye ?Central Foveal Thickness: 261. Progression has been stable. Findings include normal foveal contour, no IRF, no SRF, retinal drusen , epiretinal membrane, macular pucker (Very mild drusen, mild ERM with early pucker SN mac).  ? ?Notes ?*Images captured and stored on drive ? ?Diagnosis / Impression:  ?OD: exu ARMD - Interval improvement in edema with decreased PED/SRHM/SRF, significant central ORA ?OS: non-exu ARMD - Very mild drusen, mild ERM with early pucker SN mac ? ?Clinical management:  ?See below ? ?Abbreviations: NFP - Normal foveal profile. CME - cystoid macular edema. PED -  pigment epithelial detachment. IRF - intraretinal fluid. SRF - subretinal fluid. EZ - ellipsoid zone. ERM - epiretinal membrane. ORA - outer retinal atrophy. ORT - outer retinal tubulation. SRHM - subretinal hyper-refl

## 2021-07-15 ENCOUNTER — Encounter (INDEPENDENT_AMBULATORY_CARE_PROVIDER_SITE_OTHER): Payer: Self-pay | Admitting: Ophthalmology

## 2021-07-22 NOTE — Progress Notes (Signed)
?[facility] Note ? ?07/25/2021 ? ?  ? ?CHIEF COMPLAINT ?Patient presents for Retina Follow Up ? ? ?HISTORY OF PRESENT ILLNESS: ?Kimberly Noble is a 76 y.o. female who presents to the clinic today for:  ? ?HPI   ? ? Retina Follow Up   ?Patient presents with  Wet AMD (IVD OD 06/27/21).  In right eye.  This started 4 weeks ago.  I, the attending physician,  performed the HPI with the patient and updated documentation appropriately. ? ?  ?  ? ? Comments   ?Patient states that she sees a black hole in her vision and the vision is wavy. The wavy vision has really affected her life. She takes wavy steps because of the wavy vision. She is panicking because of her vision.  ? ?  ?  ?Last edited by Bernarda Caffey, MD on 07/25/2021  8:44 PM.  ?  ?Pt states the wavy lines she sees in her vision are very hard for her to deal with it, she states she has a hard time walking in her house bc her stairs are wavy, she still has a black spot in her vision, but can see through certain spots in it ? ? ?Referring physician: ?No referring provider defined for this encounter. ? ?HISTORICAL INFORMATION:  ? ?Selected notes from the [facility] ?Referred by Dr. Lucianne Lei for PED OD ?LEE:  ?Ocular Hx- ?PMH- ?  ? ?CURRENT MEDICATIONS: ?No current outpatient medications on file. (Ophthalmic Drugs)  ? ?No current facility-administered medications for this visit. (Ophthalmic Drugs)  ? ?Current Outpatient Medications (Other)  ?Medication Sig  ? ALPRAZolam (XANAX) 1 MG tablet Take 1 mg by mouth 3 (three) times daily. 7am, 1pm, 7pm  ? Ascorbic Acid (VITAMIN C PO) Take 1 capsule by mouth daily. supplement from [facility]  ? Biotin w/ Vitamins C & E (HAIR/SKIN/NAILS PO) Take 1 capsule by mouth daily. supplement from [facility]  ? CALCIUM CARBONATE-VITAMIN D PO Take 1 capsule by mouth daily. supplement from [facility]  ? Cholecalciferol (VITAMIN D3 PO) Take 1 capsule by mouth daily. supplement from [facility]  ? Coenzyme Q10  (COQ10 PO) Take 1 capsule by mouth daily.  ? CRANBERRY PO Take 1 capsule by mouth daily. supplement from [facility]  ? Multiple Vitamin (MULTIVITAMIN WITH MINERALS) TABS tablet Take 1 tablet by mouth daily. supplement from [facility]  ? Omega-3 Fatty Acids (FISH OIL PO) Take 1 capsule by mouth daily. supplement from [facility]  ? OVER THE COUNTER MEDICATION Take 1 capsule by mouth daily. Joint support supplement from [facility]  ? OVER THE COUNTER MEDICATION Take 1 capsule by mouth daily. Cholesterol care supplement from [facility]  ? OVER THE COUNTER MEDICATION Take 1 capsule by mouth daily. Circulation and vein support supplement from [facility]  ? OVER THE COUNTER MEDICATION Take 1 capsule by mouth daily. Green vegetable supplement from [employer]  ? OVER THE COUNTER MEDICATION Take 1 capsule by mouth daily. Liver and brain supplement from [facility]  ? OVER THE COUNTER MEDICATION Take 1 capsule by mouth daily. Eye vitamin with lutein - supplement from [facility]  ? VITAMIN K PO Take 1 capsule by mouth daily. supplement from [facility]  ? ?No current facility-administered medications for this visit. (Other)  ? ?REVIEW OF SYSTEMS: ?ROS   ?Positive for: Neurological, Eyes ?Negative for: Constitutional, Gastrointestinal, Skin, Genitourinary, Musculoskeletal, HENT, Endocrine, Cardiovascular, Respiratory, Psychiatric, Allergic/Imm, Heme/Lymph ?Last edited  by Annie Paras, COT on 07/25/2021  8:06 AM.  ?  ? ? ?ALLERGIES ?Allergies  ?Allergen Reactions  ? Crab Extract Allergy Skin Test Anaphylaxis  ?  Eating crab causes severe allergy. Claims she is not allergic to all shellfish.   ? Penicillins Rash  ?  Has patient had a PCN reaction causing immediate rash, facial/tongue/throat swelling, SOB or lightheadedness with hypotension: Yes ?Has patient had a PCN reaction causing severe rash involving mucus membranes or skin necrosis: No ?Has patient had a PCN reaction that required hospitalization No ?Has patient had a PCN reaction  occurring within the last 10 years: No ?If all of the above answers are "NO", then may proceed with Cephalosporin use. ?  ? ?PAST MEDICAL HISTORY ?Past Medical History:  ?Diagnosis Date  ? Agoraphobia with panic attacks   ? Anxiety attack   ? Major depression   ? Superficial laceration of hand ~ 03/2015  ? left thumb  ? ?Past Surgical History:  ?Procedure Laterality Date  ? TONSILLECTOMY  1950's  ? ?FAMILY HISTORY ?Family History  ?Problem Relation Age of Onset  ? Macular degeneration Mother   ? ?SOCIAL HISTORY ?Social History  ? ?Tobacco Use  ? Smoking status: Every Day  ?  Packs/day: 0.33  ?  Years: 40.00  ?  Pack years: 13.20  ?  Types: Cigarettes  ? Smokeless tobacco: Never  ?Vaping Use  ? Vaping Use: Never used  ?Substance Use Topics  ? Alcohol use: No  ? Drug use: No  ?  ? ?  ?OPHTHALMIC EXAM: ? ?Base Eye Exam   ? ? Visual Acuity (Snellen - Linear)   ? ?   Right Left  ? Dist cc 20/60 20/40  ? Dist ph cc NI NI  ? ? Correction: Glasses  ? ?  ?  ? ? Tonometry (Tonopen, 8:12 AM)   ? ?   Right Left  ? Pressure 16 16  ? ?  ?  ? ? Pupils   ? ?   Pupils Dark Light Shape React APD  ? Right PERRL 4 3 Round Brisk None  ? Left PERRL 4 3 Round Brisk None  ? ?  ?  ? ? Visual Fields   ? ?   Left Right  ?  Full Full  ? ?  ?  ? ? Extraocular Movement   ? ?   Right Left  ?  Full, Ortho Full, Ortho  ? ?  ?  ? ? Neuro/Psych   ? ? Oriented x3: Yes  ? Mood/Affect: Normal  ? ?  ?  ? ? Dilation   ? ? Both eyes: 2.5% Phenylephrine, 1.0% Mydriacyl @ 8:08 AM  ? ?  ?  ? ?  ? ?Slit Lamp and Fundus Exam   ? ? Slit Lamp Exam   ? ?   Right Left  ? Lids/Lashes Dermatochalasis - upper lid, mild MGD Dermatochalasis - upper lid, mild MGD  ? Conjunctiva/Sclera White and quiet White and quiet  ? Cornea mild arcus, 2+ inferior Punctate epithelial erosions mild arcus, 2+ inferior Punctate epithelial erosions  ? Anterior Chamber Deep and quiet Deep and quiet  ? Iris Round and dilated Round and dilated  ? Lens 2-3+ Nuclear sclerosis, 2-3+ Cortical  cataract 2-3+ Nuclear sclerosis, 2-3+ Cortical cataract  ? Anterior Vitreous Mild syneresis Mild syneresis, Posterior vitreous detachment, vitreous condensations  ? ?  ?  ? ? Fundus Exam   ? ?   Right Left  ?  Disc Pink and Sharp, temporal PPA Pink and Sharp, temporal PPA  ? C/D Ratio 0.5 0.5  ? Macula Blunted foveal reflex, interval improvement in PED height -- ?now RPE rip with inferior atrophy and superior pigment clumping, mild interval increase in SRF, no frank heme Flat, Blunted foveal reflex, fine drusen, RPE mottling and clumping  ? Vessels attenuated, Tortuous mild attenuation, mild tortuosity  ? Periphery Attached, reticular degeneration, focal pigment clumping IT Attached, reticular degeneration, focal pigment clumping IT  ? ?  ?  ? ?  ? ?Refraction   ? ? Wearing Rx   ? ?   Sphere Cylinder Axis Add  ? Right -6.00 +1.75 117 +2.50  ? Left -5.25 +2.25 085 +2.50  ? ?  ?  ? ?  ? ?IMAGING AND PROCEDURES  ?Imaging and Procedures for 07/25/2021 ? ?OCT, Retina - OU - Both Eyes   ? ?   ?Right Eye ?Central Foveal Thickness: 576. Progression has improved. Findings include abnormal foveal contour, no IRF, subretinal hyper-reflective material, subretinal fluid, vitreomacular adhesion , pigment epithelial detachment (Interval increase in inferior SRF compared to 03.30.23 study, but improved from 03.16.23 study, persistent prominent PED, ?RPE rip).  ? ?Left Eye ?Central Foveal Thickness: 258. Progression has been stable. Findings include normal foveal contour, no IRF, no SRF, retinal drusen , epiretinal membrane, macular pucker (Very mild drusen, mild ERM with early pucker SN mac).  ? ?Notes ?*Images captured and stored on drive ? ?Diagnosis / Impression:  ?OD: exu ARMD - Interval increase in inferior SRF compared to 03.30.23 study, but improved from 03.16.23 study, persistent prominent PED, ?RPE rip ?OS: non-exu ARMD - Very mild drusen, mild ERM with early pucker SN mac ? ?Clinical management:  ?See  below ? ?Abbreviations: NFP - Normal foveal profile. CME - cystoid macular edema. PED - pigment epithelial detachment. IRF - intraretinal fluid. SRF - subretinal fluid. EZ - ellipsoid zone. ERM - epiretinal membrane. ORA - outer retinal

## 2021-07-25 ENCOUNTER — Ambulatory Visit (INDEPENDENT_AMBULATORY_CARE_PROVIDER_SITE_OTHER): Payer: Medicare Other | Admitting: Ophthalmology

## 2021-07-25 ENCOUNTER — Encounter (INDEPENDENT_AMBULATORY_CARE_PROVIDER_SITE_OTHER): Payer: Self-pay | Admitting: Ophthalmology

## 2021-07-25 DIAGNOSIS — H35372 Puckering of macula, left eye: Secondary | ICD-10-CM | POA: Diagnosis not present

## 2021-07-25 DIAGNOSIS — H35033 Hypertensive retinopathy, bilateral: Secondary | ICD-10-CM

## 2021-07-25 DIAGNOSIS — H353122 Nonexudative age-related macular degeneration, left eye, intermediate dry stage: Secondary | ICD-10-CM | POA: Diagnosis not present

## 2021-07-25 DIAGNOSIS — I10 Essential (primary) hypertension: Secondary | ICD-10-CM

## 2021-07-25 DIAGNOSIS — H25813 Combined forms of age-related cataract, bilateral: Secondary | ICD-10-CM

## 2021-07-25 DIAGNOSIS — H353211 Exudative age-related macular degeneration, right eye, with active choroidal neovascularization: Secondary | ICD-10-CM

## 2021-07-25 MED ORDER — BEVACIZUMAB CHEMO INJECTION 1.25MG/0.05ML SYRINGE FOR KALEIDOSCOPE
1.2500 mg | INTRAVITREAL | Status: AC | PRN
  Administered 2021-07-25: 1.25 mg via INTRAVITREAL

## 2021-08-12 NOTE — Progress Notes (Signed)
?[facility] Note ? ?08/22/2021 ? ?  ? ?CHIEF COMPLAINT ?Patient presents for Retina Follow Up ? ? ?HISTORY OF PRESENT ILLNESS: ?Kimberly Noble is a 76 y.o. female who presents to the clinic today for:  ? ?HPI   ? ? Retina Follow Up   ?Patient presents with  Wet AMD (IVD OD #2 (04.13.23)).  In right eye.  This started months ago.  Duration of 4 weeks.  Since onset it is stable.  I, the attending physician,  performed the HPI with the patient and updated documentation appropriately. ? ?  ?  ? ? Comments   ?Patient states that the black spot she sees in the right eye will have holes in it. She complains of distortion and wavy vision with the right eye as well. She is wondering if new glasses would help. She currently is dealing with vertigo.  ? ?  ?  ?Last edited by Bernarda Caffey, MD on 08/22/2021  1:14 PM.  ?  ? ? ?Referring physician: ?No referring provider defined for this encounter. ? ?HISTORICAL INFORMATION:  ? ?Selected notes from the [facility] ?Referred by Dr. Lucianne Lei for PED OD ?LEE:  ?Ocular Hx- ?PMH- ?  ? ?CURRENT MEDICATIONS: ?No current outpatient medications on file. (Ophthalmic Drugs)  ? ?No current facility-administered medications for this visit. (Ophthalmic Drugs)  ? ?Current Outpatient Medications (Other)  ?Medication Sig  ? ALPRAZolam (XANAX) 1 MG tablet Take 1 mg by mouth 3 (three) times daily. 7am, 1pm, 7pm  ? Ascorbic Acid (VITAMIN C PO) Take 1 capsule by mouth daily. supplement from [facility]  ? Biotin w/ Vitamins C & E (HAIR/SKIN/NAILS PO) Take 1 capsule by mouth daily. supplement from [facility]  ? CALCIUM CARBONATE-VITAMIN D PO Take 1 capsule by mouth daily. supplement from [facility]  ? Cholecalciferol (VITAMIN D3 PO) Take 1 capsule by mouth daily. supplement from [facility]  ? Coenzyme Q10 (COQ10 PO) Take 1 capsule by mouth daily.  ? CRANBERRY PO Take 1 capsule by mouth daily. supplement from [facility]  ? Multiple Vitamin (MULTIVITAMIN WITH MINERALS) TABS  tablet Take 1 tablet by mouth daily. supplement from [facility]  ? Omega-3 Fatty Acids (FISH OIL PO) Take 1 capsule by mouth daily. supplement from [facility]  ? OVER THE COUNTER MEDICATION Take 1 capsule by mouth daily. Joint support supplement from [facility]  ? OVER THE COUNTER MEDICATION Take 1 capsule by mouth daily. Cholesterol care supplement from [facility]  ? OVER THE COUNTER MEDICATION Take 1 capsule by mouth daily. Circulation and vein support supplement from [facility]  ? OVER THE COUNTER MEDICATION Take 1 capsule by mouth daily. Green vegetable supplement from [employer]  ? OVER THE COUNTER MEDICATION Take 1 capsule by mouth daily. Liver and brain supplement from [facility]  ? OVER THE COUNTER MEDICATION Take 1 capsule by mouth daily. Eye vitamin with lutein - supplement from [facility]  ? VITAMIN K PO Take 1 capsule by mouth daily. supplement from [facility]  ? ?No current facility-administered medications for this visit. (Other)  ? ?REVIEW OF SYSTEMS: ?ROS   ?Positive for: Neurological, Eyes ?Negative for: Constitutional, Gastrointestinal, Skin, Genitourinary, Musculoskeletal, HENT, Endocrine, Cardiovascular, Respiratory, Psychiatric, Allergic/Imm, Heme/Lymph ?Last edited by Annie Paras, COT on 08/22/2021  8:50 AM.  ?  ? ?ALLERGIES ?Allergies  ?Allergen Reactions  ? Crab Extract Allergy Skin Test Anaphylaxis  ?  Eating crab causes severe allergy. Claims she  is not allergic to all shellfish.   ? Penicillins Rash  ?  Has patient had a PCN reaction causing immediate rash, facial/tongue/throat swelling, SOB or lightheadedness with hypotension: Yes ?Has patient had a PCN reaction causing severe rash involving mucus membranes or skin necrosis: No ?Has patient had a PCN reaction that required hospitalization No ?Has patient had a PCN reaction occurring within the last 10 years: No ?If all of the above answers are "NO", then may proceed with Cephalosporin use. ?  ? ?PAST MEDICAL HISTORY ?Past Medical History:   ?Diagnosis Date  ? Agoraphobia with panic attacks   ? Anxiety attack   ? Major depression   ? Superficial laceration of hand ~ 03/2015  ? left thumb  ? ?Past Surgical History:  ?Procedure Laterality Date  ? TONSILLECTOMY  1950's  ? ?FAMILY HISTORY ?Family History  ?Problem Relation Age of Onset  ? Macular degeneration Mother   ? ?SOCIAL HISTORY ?Social History  ? ?Tobacco Use  ? Smoking status: Every Day  ?  Packs/day: 0.33  ?  Years: 40.00  ?  Pack years: 13.20  ?  Types: Cigarettes  ? Smokeless tobacco: Never  ?Vaping Use  ? Vaping Use: Never used  ?Substance Use Topics  ? Alcohol use: No  ? Drug use: No  ?  ? ?  ?OPHTHALMIC EXAM: ? ?Base Eye Exam   ? ? Visual Acuity (Snellen - Linear)   ? ?   Right Left  ? Dist cc 20/60 20/40  ? Dist ph cc NI NI  ? ? Correction: Glasses  ? ?  ?  ? ? Tonometry (Tonopen, 8:57 AM)   ? ?   Right Left  ? Pressure 19 17  ? ?  ?  ? ? Pupils   ? ?   Dark Light Shape React APD  ? Right 4 3 Round Brisk None  ? Left 4 3 Round Brisk None  ? ?  ?  ? ? Visual Fields   ? ?   Left Right  ?  Full Full  ? ?  ?  ? ? Extraocular Movement   ? ?   Right Left  ?  Full, Ortho Full, Ortho  ? ?  ?  ? ? Neuro/Psych   ? ? Oriented x3: Yes  ? Mood/Affect: Normal  ? ?  ?  ? ? Dilation   ? ? Both eyes: 1.0% Mydriacyl, 2.5% Phenylephrine @ 8:52 AM  ? ?  ?  ? ?  ? ?Slit Lamp and Fundus Exam   ? ? Slit Lamp Exam   ? ?   Right Left  ? Lids/Lashes Dermatochalasis - upper lid, mild MGD Dermatochalasis - upper lid, mild MGD  ? Conjunctiva/Sclera White and quiet White and quiet  ? Cornea mild arcus, 2+ inferior Punctate epithelial erosions mild arcus, 2+ inferior Punctate epithelial erosions  ? Anterior Chamber Deep and quiet Deep and quiet  ? Iris Round and dilated Round and dilated  ? Lens 2-3+ Nuclear sclerosis, 2-3+ Cortical cataract 2-3+ Nuclear sclerosis, 2-3+ Cortical cataract  ? Anterior Vitreous Mild syneresis Mild syneresis, Posterior vitreous detachment, vitreous condensations  ? ?  ?  ? ? Fundus Exam    ? ?   Right Left  ? Disc Pink and Sharp, temporal PPA Pink and Sharp, temporal PPA  ? C/D Ratio 0.5 0.5  ? Macula Blunted foveal reflex, interval improvement in PED height -- RPE rip from nasal side with surrounding atrophy, mild interval improvement  in SRF, no frank heme Flat, Blunted foveal reflex, fine drusen, RPE mottling and clumping  ? Vessels attenuated, Tortuous mild attenuation, mild tortuosity  ? Periphery Attached, reticular degeneration, focal pigment clumping IT Attached, reticular degeneration, focal pigment clumping IT  ? ?  ?  ? ?  ? ?Refraction   ? ? Wearing Rx   ? ?   Sphere Cylinder Axis Add  ? Right -6.00 +1.75 117 +2.50  ? Left -5.25 +2.25 085 +2.50  ? ?  ?  ? ?  ? ?IMAGING AND PROCEDURES  ?Imaging and Procedures for 08/22/2021 ? ?OCT, Retina - OU - Both Eyes   ? ?   ?Right Eye ?Quality was good. Central Foveal Thickness: 443. Progression has improved. Findings include abnormal foveal contour, no IRF, subretinal hyper-reflective material, subretinal fluid, vitreomacular adhesion , pigment epithelial detachment (Interval improvement in SRF and central PED).  ? ?Left Eye ?Quality was good. Central Foveal Thickness: 255. Progression has been stable. Findings include normal foveal contour, no IRF, no SRF, retinal drusen , epiretinal membrane, macular pucker (Very mild drusen, mild ERM with early pucker SN mac).  ? ?Notes ?*Images captured and stored on drive ? ?Diagnosis / Impression:  ?OD: exu ARMD - interval improvement in SRF and PED ?OS: non-exu ARMD - Very mild drusen, mild ERM with early pucker SN mac ? ?Clinical management:  ?See below ? ?Abbreviations: NFP - Normal foveal profile. CME - cystoid macular edema. PED - pigment epithelial detachment. IRF - intraretinal fluid. SRF - subretinal fluid. EZ - ellipsoid zone. ERM - epiretinal membrane. ORA - outer retinal atrophy. ORT - outer retinal tubulation. SRHM - subretinal hyper-reflective material. IRHM - intraretinal hyper-reflective  material ? ? ?  ? ?Intravitreal Injection, Pharmacologic Agent - OD - Right Eye   ? ?   ?Time Out ?08/22/2021. 9:45 AM. Confirmed correct patient, procedure, site, and patient consented.  ? ?Anesthesia ?Topical anesthesia was

## 2021-08-22 ENCOUNTER — Encounter (INDEPENDENT_AMBULATORY_CARE_PROVIDER_SITE_OTHER): Payer: Self-pay | Admitting: Ophthalmology

## 2021-08-22 ENCOUNTER — Ambulatory Visit (INDEPENDENT_AMBULATORY_CARE_PROVIDER_SITE_OTHER): Payer: Medicare Other | Admitting: Ophthalmology

## 2021-08-22 DIAGNOSIS — H35372 Puckering of macula, left eye: Secondary | ICD-10-CM | POA: Diagnosis not present

## 2021-08-22 DIAGNOSIS — I10 Essential (primary) hypertension: Secondary | ICD-10-CM

## 2021-08-22 DIAGNOSIS — H353211 Exudative age-related macular degeneration, right eye, with active choroidal neovascularization: Secondary | ICD-10-CM | POA: Diagnosis not present

## 2021-08-22 DIAGNOSIS — H25813 Combined forms of age-related cataract, bilateral: Secondary | ICD-10-CM

## 2021-08-22 DIAGNOSIS — H353122 Nonexudative age-related macular degeneration, left eye, intermediate dry stage: Secondary | ICD-10-CM

## 2021-08-22 DIAGNOSIS — H35033 Hypertensive retinopathy, bilateral: Secondary | ICD-10-CM

## 2021-08-22 MED ORDER — BEVACIZUMAB CHEMO INJECTION 1.25MG/0.05ML SYRINGE FOR KALEIDOSCOPE
1.2500 mg | INTRAVITREAL | Status: AC | PRN
  Administered 2021-08-22: 1.25 mg via INTRAVITREAL

## 2021-09-06 NOTE — Progress Notes (Signed)
[facility] Note  09/19/2021    CHIEF COMPLAINT Patient presents for Retina Follow Up  HISTORY OF PRESENT ILLNESS: Kimberly Noble is a 76 y.o. female who presents to the clinic today for:   HPI     Retina Follow Up   Patient presents with  Wet AMD (IVD OD #3 (05.11.23)).  In right eye.  This started months ago.  Duration of 4 weeks.  Since onset it is stable.  I, the attending physician,  performed the HPI with the patient and updated documentation appropriately.        Comments   Patient states that the vision in the left eye is worse. It's really bad in the morning when she first wakes up and then clears as the day goes on. The vision in the right eye is not any better.      Last edited by Bernarda Caffey, MD on 09/19/2021  1:20 PM.    Patient thinks OS is very blurry.  After an hour it improves.  Referring physician: No referring provider defined for this encounter.  HISTORICAL INFORMATION:   Selected notes from the MEDICAL RECORD NUMBER Referred by Dr. Lucianne Lei for PED OD LEE:  Ocular Hx- PMH-    CURRENT MEDICATIONS: No current outpatient medications on file. (Ophthalmic Drugs)   No current facility-administered medications for this visit. (Ophthalmic Drugs)   Current Outpatient Medications (Other)  Medication Sig   ALPRAZolam (XANAX) 1 MG tablet Take 1 mg by mouth 3 (three) times daily. 7am, 1pm, 7pm   Ascorbic Acid (VITAMIN C PO) Take 1 capsule by mouth daily. supplement from [facility]   Biotin w/ Vitamins C & E (HAIR/SKIN/NAILS PO) Take 1 capsule by mouth daily. supplement from [facility]   CALCIUM CARBONATE-VITAMIN D PO Take 1 capsule by mouth daily. supplement from [facility]   Cholecalciferol (VITAMIN D3 PO) Take 1 capsule by mouth daily. supplement from [facility]   Coenzyme Q10 (COQ10 PO) Take 1 capsule by mouth daily.   CRANBERRY PO Take 1 capsule by mouth daily. supplement from [facility]   Multiple Vitamin (MULTIVITAMIN WITH MINERALS)  TABS tablet Take 1 tablet by mouth daily. supplement from [facility]   Omega-3 Fatty Acids (FISH OIL PO) Take 1 capsule by mouth daily. supplement from [facility]   OVER THE COUNTER MEDICATION Take 1 capsule by mouth daily. Joint support supplement from [facility]   OVER THE COUNTER MEDICATION Take 1 capsule by mouth daily. Cholesterol care supplement from [facility]   OVER THE COUNTER MEDICATION Take 1 capsule by mouth daily. Circulation and vein support supplement from [facility]   OVER THE COUNTER MEDICATION Take 1 capsule by mouth daily. Green vegetable supplement from [employer]   OVER THE COUNTER MEDICATION Take 1 capsule by mouth daily. Liver and brain supplement from [facility]   OVER THE COUNTER MEDICATION Take 1 capsule by mouth daily. Eye vitamin with lutein - supplement from [facility]   VITAMIN K PO Take 1 capsule by mouth daily. supplement from [facility]   No current facility-administered medications for this visit. (Other)   REVIEW OF SYSTEMS: ROS   Positive for: Neurological, Eyes Negative for: Constitutional, Gastrointestinal, Skin, Genitourinary, Musculoskeletal, HENT, Endocrine, Cardiovascular, Respiratory, Psychiatric, Allergic/Imm, Heme/Lymph Last edited by Annie Paras, COT on 09/19/2021  8:29 AM.     ALLERGIES Allergies  Allergen Reactions   Crab Extract Allergy Skin Test Anaphylaxis    Eating crab causes  severe allergy. Claims she is not allergic to all shellfish.    Penicillins Rash    Has patient had a PCN reaction causing immediate rash, facial/tongue/throat swelling, SOB or lightheadedness with hypotension: Yes Has patient had a PCN reaction causing severe rash involving mucus membranes or skin necrosis: No Has patient had a PCN reaction that required hospitalization No Has patient had a PCN reaction occurring within the last 10 years: No If all of the above answers are "NO", then may proceed with Cephalosporin use.    PAST MEDICAL HISTORY Past Medical History:   Diagnosis Date   Agoraphobia with panic attacks    Anxiety attack    Major depression    Superficial laceration of hand ~ 03/2015   left thumb   Past Surgical History:  Procedure Laterality Date   TONSILLECTOMY  1950's   FAMILY HISTORY Family History  Problem Relation Age of Onset   Macular degeneration Mother    SOCIAL HISTORY Social History   Tobacco Use   Smoking status: Every Day    Packs/day: 0.33    Years: 40.00    Total pack years: 13.20    Types: Cigarettes   Smokeless tobacco: Never  Vaping Use   Vaping Use: Never used  Substance Use Topics   Alcohol use: No   Drug use: No       OPHTHALMIC EXAM:  Base Eye Exam     Visual Acuity (Snellen - Linear)       Right Left   Dist cc 20/60 20/40   Dist ph cc NI NI    Correction: Glasses         Tonometry (Tonopen, 8:34 AM)       Right Left   Pressure 16 18         Pupils       Pupils Dark Light Shape React APD   Right PERRL 4 3 Round Brisk None   Left PERRL 4 3 Round Brisk None         Visual Fields       Left Right    Full Full         Extraocular Movement       Right Left    Full, Ortho Full, Ortho         Neuro/Psych     Oriented x3: Yes   Mood/Affect: Normal         Dilation     Both eyes: 1.0% Mydriacyl, 2.5% Phenylephrine @ 8:32 AM           Slit Lamp and Fundus Exam     Slit Lamp Exam       Right Left   Lids/Lashes Dermatochalasis - upper lid, mild MGD Dermatochalasis - upper lid, mild MGD   Conjunctiva/Sclera White and quiet White and quiet   Cornea mild arcus, 2+ inferior Punctate epithelial erosions mild arcus, 2+ inferior Punctate epithelial erosions   Anterior Chamber Deep and quiet Deep and quiet   Iris Round and dilated Round and dilated   Lens 2-3+ Nuclear sclerosis, 2-3+ Cortical cataract 2-3+ Nuclear sclerosis, 2-3+ Cortical cataract   Anterior Vitreous Mild syneresis Mild syneresis, Posterior vitreous detachment, vitreous condensations          Fundus Exam       Right Left   Disc Pink and Sharp, temporal PPA Pink and Sharp, temporal PPA   C/D Ratio 0.5 0.5   Macula Blunted foveal reflex, RPE rip from nasal side with surrounding atrophy, persistent  SRF, no frank heme Flat, Blunted foveal reflex, fine drusen, RPE mottling and clumping   Vessels attenuated, Tortuous mild attenuation, mild tortuosity   Periphery Attached, reticular degeneration, focal pigment clumping IT Attached, reticular degeneration, focal pigment clumping IT           Refraction     Wearing Rx       Sphere Cylinder Axis Add   Right -6.00 +1.75 117 +2.50   Left -5.25 +2.25 085 +2.50           IMAGING AND PROCEDURES  Imaging and Procedures for 09/19/2021  OCT, Retina - OU - Both Eyes       Right Eye Quality was good. Central Foveal Thickness: 444. Progression has been stable. Findings include abnormal foveal contour, subretinal hyper-reflective material, intraretinal fluid, pigment epithelial detachment, subretinal fluid, vitreomacular adhesion (Persistent central PED/ CNV with overlying SRF and IRF; mild interval improvement in surrounding SRF on temp side of PED).   Left Eye Quality was good. Central Foveal Thickness: 265. Progression has been stable. Findings include normal foveal contour, no IRF, no SRF, retinal drusen , epiretinal membrane, macular pucker (Very mild drusen, mild ERM with early pucker SN mac).   Notes *Images captured and stored on drive  Diagnosis / Impression:  OD: exu ARMD - Persistent central PED/ CNV with overlying SRF and IRF; mild interval improvement in surrounding SRF on temp side of PED OS: non-exu ARMD - Very mild drusen, mild ERM with early pucker SN mac  Clinical management:  See below  Abbreviations: NFP - Normal foveal profile. CME - cystoid macular edema. PED - pigment epithelial detachment. IRF - intraretinal fluid. SRF - subretinal fluid. EZ - ellipsoid zone. ERM - epiretinal membrane. ORA - outer  retinal atrophy. ORT - outer retinal tubulation. SRHM - subretinal hyper-reflective material. IRHM - intraretinal hyper-reflective material      Intravitreal Injection, Pharmacologic Agent - OD - Right Eye       Time Out 09/19/2021. 9:14 AM. Confirmed correct patient, procedure, site, and patient consented.   Anesthesia Topical anesthesia was used. Anesthetic medications included Lidocaine 2%, Proparacaine 0.5%.   Procedure Preparation included 5% betadine to ocular surface, eyelid speculum. A supplied (32g) needle was used.   Injection: 1.25 mg Bevacizumab 1.47m/0.05ml   Route: Intravitreal, Site: Right Eye   NDC:: 44034-742-59 Lot: [id number] , Expiration date: 10/29/2021   Post-op Post injection exam found visual acuity of at least counting fingers. The patient tolerated the procedure well. There were no complications. The patient received written and verbal post procedure care education. Post injection medications were not given.            ASSESSMENT/PLAN:    ICD-10-CM   1. Exudative age-related macular degeneration of right eye with active choroidal neovascularization (HCC)  H35.3211 OCT, Retina - OU - Both Eyes    Intravitreal Injection, Pharmacologic Agent - OD - Right Eye    Bevacizumab (AVASTIN) SOLN 1.25 mg    2. 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 Epiretinal membrane (ERM) of left eye  H35.372     4. Essential hypertension  I10     5. Hypertensive retinopathy of both eyes  H35.033      1. 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, right eye  - s/p IVA OD #1 (03.16.23), #2 (04.13.23), #3 (05.11.23) - pt presented initially on 3.16.23 with 2 wk history of central vision loss and distortion  - initial exam and OCT  OD showed large central PED with mild surrounding SRF and mild SRHM overlying - BCVA 20/60 OD - stable - OCT shows OD: Persistent central PED/ CNV with overlying SRF and IRF; mild interval  improvement in surrounding SRF on temp side of PED - discussed possible IVA resistance and the possibility of switching medications next visit to [facility] or [facility] (will need to check for authorization)  - recommend IVA OD #4 today, 06.08.23 - pt wishes to proceed with injection - RBA of procedure discussed, questions answered - informed consent obtained and signed - see procedure note  - f/u 4 weeks, DFE, OCT  2. Age related macular degeneration, non-exudative, left eye  - mild/intermediate stage with mild drusen  - The incidence, anatomy, and pathology of dry AMD, risk of progression, and the AREDS and AREDS 2 study including smoking risks discussed with patient.   - Recommend amsler grid monitoring  3. Epiretinal membrane, left eye  - mild ERM OS w/ early pucker, SN macula - BCVA 20/40 - asymptomatic, no metamorphopsia - no indication for surgery at this time - monitor for now  4,5. Hypertensive retinopathy OU - discussed importance of tight BP control - monitor   6. Mixed Cataract OU - The symptoms of cataract, surgical options, and treatments and risks were discussed with patient.  - discussed diagnosis and progression - under the expert management of Dr. Lucianne Lei  Ophthalmic Meds Ordered this visit:  Meds ordered this encounter  Medications   Bevacizumab (AVASTIN) SOLN 1.25 mg     Return in about 4 weeks (around 10/17/2021) for DFE, OCT, possible injection.  There are no Patient Instructions on file for this visit.   Explained the diagnoses, plan, and follow up with the patient and they expressed understanding.  Patient expressed understanding of the importance of proper follow up care.   This document serves as a record of services personally performed by Gardiner Sleeper, MD, PhD. It was created on their behalf by San Jetty. Owens Shark, OA an ophthalmic technician. The creation of this record is the provider's dictation and/or activities during the visit.    Electronically  signed by: San Jetty. Owens Shark, [place] 05.26.2023 1:24 PM  This document serves as a record of services personally performed by Gardiner Sleeper, MD, PhD. It was created on their behalf by Leonie Douglas, an ophthalmic technician. The creation of this record is the provider's dictation and/or activities during the visit.    Electronically signed by: Leonie Douglas COA, 09/19/21  1:24 PM  Gardiner Sleeper, M.D., Ph.D. Diseases & Surgery of the Retina and Vitreous [facility]  I have reviewed the above documentation for accuracy and completeness, and I agree with the above. Gardiner Sleeper, M.D., Ph.D. 09/19/21 1:24 PM  Abbreviations: M myopia (nearsighted); A astigmatism; H hyperopia (farsighted); P presbyopia; Mrx spectacle prescription;  CTL contact lenses; OD right eye; OS left eye; OU both eyes  XT exotropia; ET esotropia; PEK punctate epithelial keratitis; PEE punctate epithelial erosions; DES dry eye syndrome; MGD meibomian gland dysfunction; ATs artificial tears; PFAT's preservative free artificial tears; [place] nuclear sclerotic cataract; PSC posterior subcapsular cataract; ERM epi-retinal membrane; PVD posterior vitreous detachment; RD retinal detachment; DM diabetes mellitus; DR diabetic retinopathy; NPDR non-proliferative diabetic retinopathy; PDR proliferative diabetic retinopathy; CSME clinically significant macular edema; DME diabetic macular edema; dbh dot blot hemorrhages; CWS cotton wool spot; POAG primary open angle glaucoma; C/D cup-to-disc ratio; HVF humphrey visual field; GVF goldmann visual field; OCT optical coherence  tomography; IOP intraocular pressure; BRVO Branch retinal vein occlusion; CRVO central retinal vein occlusion; CRAO central retinal artery occlusion; BRAO branch retinal artery occlusion; RT retinal tear; SB scleral buckle; PPV pars plana vitrectomy; VH Vitreous hemorrhage; PRP panretinal laser photocoagulation; IVK intravitreal kenalog; VMT vitreomacular  traction; MH Macular hole;  NVD neovascularization of the disc; NVE neovascularization elsewhere; AREDS age related eye disease study; ARMD age related macular degeneration; POAG primary open angle glaucoma; EBMD epithelial/anterior basement membrane dystrophy; ACIOL anterior chamber intraocular lens; IOL intraocular lens; PCIOL posterior chamber intraocular lens; Phaco/IOL phacoemulsification with intraocular lens placement; [place] photorefractive keratectomy; LASIK laser assisted in situ keratomileusis; HTN hypertension; DM diabetes mellitus; COPD chronic obstructive pulmonary disease

## 2021-09-19 ENCOUNTER — Ambulatory Visit (INDEPENDENT_AMBULATORY_CARE_PROVIDER_SITE_OTHER): Payer: Medicare Other | Admitting: Ophthalmology

## 2021-09-19 ENCOUNTER — Encounter (INDEPENDENT_AMBULATORY_CARE_PROVIDER_SITE_OTHER): Payer: Self-pay | Admitting: Ophthalmology

## 2021-09-19 DIAGNOSIS — H35372 Puckering of macula, left eye: Secondary | ICD-10-CM | POA: Diagnosis not present

## 2021-09-19 DIAGNOSIS — I10 Essential (primary) hypertension: Secondary | ICD-10-CM

## 2021-09-19 DIAGNOSIS — H353211 Exudative age-related macular degeneration, right eye, with active choroidal neovascularization: Secondary | ICD-10-CM | POA: Diagnosis not present

## 2021-09-19 DIAGNOSIS — H35033 Hypertensive retinopathy, bilateral: Secondary | ICD-10-CM

## 2021-09-19 DIAGNOSIS — H353122 Nonexudative age-related macular degeneration, left eye, intermediate dry stage: Secondary | ICD-10-CM

## 2021-09-19 MED ORDER — BEVACIZUMAB CHEMO INJECTION 1.25MG/0.05ML SYRINGE FOR KALEIDOSCOPE
1.2500 mg | INTRAVITREAL | Status: AC | PRN
  Administered 2021-09-19: 1.25 mg via INTRAVITREAL

## 2021-10-03 NOTE — Progress Notes (Signed)
[facility] Note  10/17/2021    CHIEF COMPLAINT Patient presents for Retina Follow Up  HISTORY OF PRESENT ILLNESS: Kimberly Noble is a 76 y.o. female who presents to the clinic today for:   HPI     Retina Follow Up   Patient presents with  Wet AMD (IVD OD #4 (06.08.23)).  In right eye.  This started months ago.  Duration of 4 weeks.  Since onset it is stable.  I, the attending physician,  performed the HPI with the patient and updated documentation appropriately.        Comments   Patient feels that the vision in the left eye gets blurry. She states that the vision in the right eye is the same.       Last edited by Bernarda Caffey, MD on 10/17/2021 12:15 PM.    Patient    Referring physician: No referring provider defined for this encounter.  HISTORICAL INFORMATION:   Selected notes from the MEDICAL RECORD NUMBER Referred by Dr. Lucianne Lei for PED OD LEE:  Ocular Hx- PMH-    CURRENT MEDICATIONS: No current outpatient medications on file. (Ophthalmic Drugs)   No current facility-administered medications for this visit. (Ophthalmic Drugs)   Current Outpatient Medications (Other)  Medication Sig   ALPRAZolam (XANAX) 1 MG tablet Take 1 mg by mouth 3 (three) times daily. 7am, 1pm, 7pm   Ascorbic Acid (VITAMIN C PO) Take 1 capsule by mouth daily. supplement from [facility]   Biotin w/ Vitamins C & E (HAIR/SKIN/NAILS PO) Take 1 capsule by mouth daily. supplement from [facility]   CALCIUM CARBONATE-VITAMIN D PO Take 1 capsule by mouth daily. supplement from [facility]   Cholecalciferol (VITAMIN D3 PO) Take 1 capsule by mouth daily. supplement from [facility]   Coenzyme Q10 (COQ10 PO) Take 1 capsule by mouth daily.   CRANBERRY PO Take 1 capsule by mouth daily. supplement from [facility]   Multiple Vitamin (MULTIVITAMIN WITH MINERALS) TABS tablet Take 1 tablet by mouth daily. supplement from [facility]   Omega-3 Fatty Acids (FISH OIL PO) Take 1 capsule by mouth  daily. supplement from [facility]   OVER THE COUNTER MEDICATION Take 1 capsule by mouth daily. Joint support supplement from [facility]   OVER THE COUNTER MEDICATION Take 1 capsule by mouth daily. Cholesterol care supplement from [facility]   OVER THE COUNTER MEDICATION Take 1 capsule by mouth daily. Circulation and vein support supplement from [facility]   OVER THE COUNTER MEDICATION Take 1 capsule by mouth daily. Green vegetable supplement from [employer]   OVER THE COUNTER MEDICATION Take 1 capsule by mouth daily. Liver and brain supplement from [facility]   OVER THE COUNTER MEDICATION Take 1 capsule by mouth daily. Eye vitamin with lutein - supplement from [facility]   VITAMIN K PO Take 1 capsule by mouth daily. supplement from [facility]   No current facility-administered medications for this visit. (Other)   REVIEW OF SYSTEMS: ROS   Positive for: Neurological, Eyes Negative for: Constitutional, Gastrointestinal, Skin, Genitourinary, Musculoskeletal, HENT, Endocrine, Cardiovascular, Respiratory, Psychiatric, Allergic/Imm, Heme/Lymph Last edited by Annie Paras, COT on 10/17/2021  8:07 AM.     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 Claims she is not allergic to all shellfish.    Penicillins Rash    Has patient had a PCN reaction causing immediate  rash, facial/tongue/throat swelling, SOB or lightheadedness with hypotension: Yes Has patient had a PCN reaction causing severe rash involving mucus membranes or skin necrosis: No Has patient had a PCN reaction that required hospitalization No Has patient had a PCN reaction occurring within the last 10 years: No If all of the above answers are "NO", then may proceed with Cephalosporin use.    PAST MEDICAL HISTORY Past Medical History:  Diagnosis Date   Agoraphobia with panic attacks    Anxiety attack    Major depression    Superficial laceration of hand ~  03/2015   left thumb   Past Surgical History:  Procedure Laterality Date   TONSILLECTOMY  1950's   FAMILY HISTORY Family History  Problem Relation Age of Onset   Macular degeneration Mother    SOCIAL HISTORY Social History   Tobacco Use   Smoking status: Every Day    Packs/day: 0.33    Years: 40.00    Total pack years: 13.20    Types: Cigarettes   Smokeless tobacco: Never  Vaping Use   Vaping Use: Never used  Substance Use Topics   Alcohol use: No   Drug use: No       OPHTHALMIC EXAM:  Base Eye Exam     Visual Acuity (Snellen - Linear)       Right Left   Dist cc 20/70 20/50   Dist ph cc NI NI  Complaining of letters moving around.        Tonometry (Tonopen, 8:13 AM)       Right Left   Pressure 17 17         Pupils       Pupils Dark Light Shape React APD   Right PERRL 4 3 Round Brisk None   Left PERRL 4 3 Round Brisk None         Visual Fields       Left Right    Full Full         Extraocular Movement       Right Left    Full, Ortho Full, Ortho         Neuro/Psych     Oriented x3: Yes   Mood/Affect: Normal         Dilation     Both eyes: 2.5% Phenylephrine, 1.0% Mydriacyl @ 8:08 AM           Slit Lamp and Fundus Exam     Slit Lamp Exam       Right Left   Lids/Lashes Dermatochalasis - upper lid, mild MGD Dermatochalasis - upper lid, mild MGD   Conjunctiva/Sclera White and quiet White and quiet   Cornea mild arcus, 2+ inferior Punctate epithelial erosions mild arcus, 2+ inferior Punctate epithelial erosions   Anterior Chamber Deep and quiet Deep and quiet   Iris Round and dilated Round and dilated   Lens 2-3+ Nuclear sclerosis, 2-3+ Cortical cataract 2-3+ Nuclear sclerosis, 2-3+ Cortical cataract   Anterior Vitreous Mild syneresis Mild syneresis, Posterior vitreous detachment, vitreous condensations         Fundus Exam       Right Left   Disc Pink and Sharp, temporal PPA, Compact Pink and Sharp, temporal  PPA, Compact   C/D Ratio 0.5 0.5   Macula Blunted foveal reflex, +CNV with RPE rip and surrounding atrophy, persistent SRF, no frank heme Flat, Blunted foveal reflex, fine drusen, RPE mottling and clumping, No heme or edema   Vessels attenuated, Tortuous mild attenuation,  mild tortuosity   Periphery Attached, reticular degeneration, focal pigment clumping IT, No heme Attached, reticular degeneration, focal pigment clumping IT           Refraction     Wearing Rx       Sphere Cylinder Axis Add   Right -6.00 +1.75 117 +2.50   Left -5.25 +2.25 085 +2.50           IMAGING AND PROCEDURES  Imaging and Procedures for 10/17/2021  OCT, Retina - OU - Both Eyes       Right Eye Quality was good. Central Foveal Thickness: 482. Progression has been stable. Findings include abnormal foveal contour, subretinal hyper-reflective material, intraretinal fluid, pigment epithelial detachment, subretinal fluid, vitreomacular adhesion (Persistent central PED/ CNV with overlying SRF and IRF; mild interval improvement in IRF ).   Left Eye Quality was good. Central Foveal Thickness: 256. Progression has been stable. Findings include normal foveal contour, no IRF, no SRF, retinal drusen , epiretinal membrane, macular pucker (Very mild drusen, mild ERM with early pucker SN mac).   Notes *Images captured and stored on drive  Diagnosis / Impression:  OD: exu ARMD - Persistent central PED / CNV with overlying SRF and IRF; mild interval improvement in IRF  OS: non-exu ARMD - Very mild drusen, mild ERM with early pucker SN mac  Clinical management:  See below  Abbreviations: NFP - Normal foveal profile. CME - cystoid macular edema. PED - pigment epithelial detachment. IRF - intraretinal fluid. SRF - subretinal fluid. EZ - ellipsoid zone. ERM - epiretinal membrane. ORA - outer retinal atrophy. ORT - outer retinal tubulation. SRHM - subretinal hyper-reflective material. IRHM - intraretinal hyper-reflective  material      Intravitreal Injection, Pharmacologic Agent - OD - Right Eye       Time Out 10/17/2021. 8:54 AM. Confirmed correct patient, procedure, site, and patient consented.   Anesthesia Topical anesthesia was used. Anesthetic medications included Lidocaine 2%, Proparacaine 0.5%.   Procedure Preparation included 5% betadine to ocular surface, eyelid speculum. A (32g) needle was used.   Injection: 2 mg aflibercept 2 MG/0.05ML   Route: Intravitreal, Site: Right Eye   NDC: [id number], Lot: [id number], Expiration date: 08/12/2022, Waste: 0 mL   Post-op Post injection exam found visual acuity of at least counting fingers. The patient tolerated the procedure well. There were no complications. The patient received written and verbal post procedure care education. Post injection medications were not given.            ASSESSMENT/PLAN:    ICD-10-CM   1. Exudative age-related macular degeneration of right eye with active choroidal neovascularization (HCC)  H35.3211 OCT, Retina - OU - Both Eyes    Intravitreal Injection, Pharmacologic Agent - OD - Right Eye    aflibercept (EYLEA) SOLN 2 mg    2. 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 Epiretinal membrane (ERM) of left eye  H35.372     4. Essential hypertension  I10     5. Hypertensive retinopathy of both eyes  H35.033     6. Combined forms of age-related cataract of both eyes  H25.813      1. 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, right eye  - s/p IVA OD #1 (03.16.23), #2 (04.13.23), #3 (05.11.23), #4 (06.08.23) - pt presented initially on 3.16.23 with 2 wk history of central vision loss and distortion  - initial exam and OCT OD showed large central PED with mild surrounding  SRF and mild SRHM overlying - BCVA decreased to 20/70 from 20/60 OD  - OCT shows OD: Persistent central PED/ CNV with overlying SRF and IRF; mild interval improvement in surrounding IRF -  discussed possible IVA resistance, and potential switch in medication - recommend IVE OD #1 today, 07.06.23 - pt wishes to proceed with injection - RBA of procedure discussed, questions answered - IVE informed consent obtained and signed, 07.06.23 (OD) - see procedure note  - f/u 4 weeks, DFE, OCT  2. Age related macular degeneration, non-exudative, left eye  - mild/intermediate stage with mild drusen  - The incidence, anatomy, and pathology of dry AMD, risk of progression, and the AREDS and AREDS 2 study including smoking risks discussed with patient.   - Recommend amsler grid monitoring  3. Epiretinal membrane, left eye  - mild ERM OS w/ early pucker, SN macula - BCVA 20/50 - asymptomatic, no metamorphopsia - no indication for surgery at this time - monitor for now  4,5. Hypertensive retinopathy OU - discussed importance of tight BP control - monitor   6. Mixed Cataract OU - The symptoms of cataract, surgical options, and treatments and risks were discussed with patient.  - discussed diagnosis and progression - under the expert management of Dr. Lucianne Lei  Ophthalmic Meds Ordered this visit:  Meds ordered this encounter  Medications   aflibercept (EYLEA) SOLN 2 mg     Return in about 4 weeks (around 11/14/2021) for f/u exu ARMD OD, DFE, OCT.  There are no Patient Instructions on file for this visit.   Explained the diagnoses, plan, and follow up with the patient and they expressed understanding.  Patient expressed understanding of the importance of proper follow up care.   This document serves as a record of services personally performed by Gardiner Sleeper, MD, PhD. It was created on their behalf by San Jetty. Owens Shark, OA an ophthalmic technician. The creation of this record is the provider's dictation and/or activities during the visit.    Electronically signed by: San Jetty. Marguerita Merles 06.22.2023 12:17 PM  Gardiner Sleeper, M.D., Ph.D. Diseases & Surgery of the Retina and  Vitreous [facility]  I have reviewed the above documentation for accuracy and completeness, and I agree with the above. Gardiner Sleeper, M.D., Ph.D. 10/17/21 12:32 PM  Abbreviations: M myopia (nearsighted); A astigmatism; H hyperopia (farsighted); P presbyopia; Mrx spectacle prescription;  CTL contact lenses; OD right eye; OS left eye; OU both eyes  XT exotropia; ET esotropia; PEK punctate epithelial keratitis; PEE punctate epithelial erosions; DES dry eye syndrome; MGD meibomian gland dysfunction; ATs artificial tears; PFAT's preservative free artificial tears; [place] nuclear sclerotic cataract; PSC posterior subcapsular cataract; ERM epi-retinal membrane; PVD posterior vitreous detachment; RD retinal detachment; DM diabetes mellitus; DR diabetic retinopathy; NPDR non-proliferative diabetic retinopathy; PDR proliferative diabetic retinopathy; CSME clinically significant macular edema; DME diabetic macular edema; dbh dot blot hemorrhages; CWS cotton wool spot; POAG primary open angle glaucoma; C/D cup-to-disc ratio; HVF humphrey visual field; GVF goldmann visual field; OCT optical coherence tomography; IOP intraocular pressure; BRVO Branch retinal vein occlusion; CRVO central retinal vein occlusion; CRAO central retinal artery occlusion; BRAO branch retinal artery occlusion; RT retinal tear; SB scleral buckle; PPV pars plana vitrectomy; VH Vitreous hemorrhage; PRP panretinal laser photocoagulation; IVK intravitreal kenalog; VMT vitreomacular traction; MH Macular hole;  NVD neovascularization of the disc; NVE neovascularization elsewhere; AREDS age related eye disease study; ARMD age related macular degeneration; POAG primary open angle glaucoma;  EBMD epithelial/anterior basement membrane dystrophy; ACIOL anterior chamber intraocular lens; IOL intraocular lens; PCIOL posterior chamber intraocular lens; Phaco/IOL phacoemulsification with intraocular lens placement; [place] photorefractive  keratectomy; LASIK laser assisted in situ keratomileusis; HTN hypertension; DM diabetes mellitus; COPD chronic obstructive pulmonary disease

## 2021-10-17 ENCOUNTER — Encounter (INDEPENDENT_AMBULATORY_CARE_PROVIDER_SITE_OTHER): Payer: Self-pay | Admitting: Ophthalmology

## 2021-10-17 ENCOUNTER — Ambulatory Visit (INDEPENDENT_AMBULATORY_CARE_PROVIDER_SITE_OTHER): Payer: Medicare Other | Admitting: Ophthalmology

## 2021-10-17 DIAGNOSIS — H25813 Combined forms of age-related cataract, bilateral: Secondary | ICD-10-CM

## 2021-10-17 DIAGNOSIS — I10 Essential (primary) hypertension: Secondary | ICD-10-CM

## 2021-10-17 DIAGNOSIS — H35033 Hypertensive retinopathy, bilateral: Secondary | ICD-10-CM

## 2021-10-17 DIAGNOSIS — H35372 Puckering of macula, left eye: Secondary | ICD-10-CM | POA: Diagnosis not present

## 2021-10-17 DIAGNOSIS — H353211 Exudative age-related macular degeneration, right eye, with active choroidal neovascularization: Secondary | ICD-10-CM

## 2021-10-17 DIAGNOSIS — H353122 Nonexudative age-related macular degeneration, left eye, intermediate dry stage: Secondary | ICD-10-CM | POA: Diagnosis not present

## 2021-10-17 MED ORDER — AFLIBERCEPT 2MG/0.05ML IZ SOLN FOR KALEIDOSCOPE
2.0000 mg | INTRAVITREAL | Status: AC | PRN
  Administered 2021-10-17: 2 mg via INTRAVITREAL

## 2021-10-30 ENCOUNTER — Other Ambulatory Visit (INDEPENDENT_AMBULATORY_CARE_PROVIDER_SITE_OTHER): Payer: Self-pay

## 2021-10-30 NOTE — Progress Notes (Signed)
[facility] Clinic Note  10/30/2021    CHIEF COMPLAINT Patient presents for No chief complaint on file.  HISTORY OF PRESENT ILLNESS: Kimberly Noble is a 76 y.o. female who presents to the clinic today for:    Patient    Referring physician: No referring provider defined for this encounter.  HISTORICAL INFORMATION:   Selected notes from the MEDICAL RECORD NUMBER Referred by Dr. Zenaida Niece for PED OD LEE:  Ocular Hx- PMH-    CURRENT MEDICATIONS: No current outpatient medications on file. (Ophthalmic Drugs)   No current facility-administered medications for this visit. (Ophthalmic Drugs)   Current Outpatient Medications (Other)  Medication Sig   ALPRAZolam (XANAX) 1 MG tablet Take 1 mg by mouth 3 (three) times daily. 7am, 1pm, 7pm   Ascorbic Acid (VITAMIN C PO) Take 1 capsule by mouth daily. supplement from [facility]   Biotin w/ Vitamins C & E (HAIR/SKIN/NAILS PO) Take 1 capsule by mouth daily. supplement from [facility]   CALCIUM CARBONATE-VITAMIN D PO Take 1 capsule by mouth daily. supplement from [facility]   Cholecalciferol (VITAMIN D3 PO) Take 1 capsule by mouth daily. supplement from [facility]   Coenzyme Q10 (COQ10 PO) Take 1 capsule by mouth daily.   CRANBERRY PO Take 1 capsule by mouth daily. supplement from [facility]   Multiple Vitamin (MULTIVITAMIN WITH MINERALS) TABS tablet Take 1 tablet by mouth daily. supplement from [facility]   Omega-3 Fatty Acids (FISH OIL PO) Take 1 capsule by mouth daily. supplement from [facility]   OVER THE COUNTER MEDICATION Take 1 capsule by mouth daily. Joint support supplement from [facility]   OVER THE COUNTER MEDICATION Take 1 capsule by mouth daily. Cholesterol care supplement from [facility]   OVER THE COUNTER MEDICATION Take 1 capsule by mouth daily. Circulation and vein support supplement from [facility]   OVER THE COUNTER MEDICATION Take 1 capsule by mouth daily. Green vegetable supplement from [employer]   OVER THE COUNTER  MEDICATION Take 1 capsule by mouth daily. Liver and brain supplement from [facility]   OVER THE COUNTER MEDICATION Take 1 capsule by mouth daily. Eye vitamin with lutein - supplement from [facility]   VITAMIN K PO Take 1 capsule by mouth daily. supplement from [facility]   No current facility-administered medications for this visit. 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 Claims she is not allergic to all shellfish.    Penicillins Rash    Has patient had a PCN reaction causing immediate rash, facial/tongue/throat swelling, SOB or lightheadedness with hypotension: Yes Has patient had a PCN reaction causing severe rash involving mucus membranes or skin necrosis: No Has patient had a PCN reaction that required hospitalization No Has patient had a PCN reaction occurring within the last 10 years: No If all of the above answers are "NO", then may proceed with Cephalosporin use.    PAST MEDICAL HISTORY Past Medical History:  Diagnosis Date   Agoraphobia with panic attacks    Anxiety attack    Major depression    Superficial laceration of hand ~ 03/2015   left thumb   Past Surgical History:  Procedure Laterality Date   TONSILLECTOMY  1950's   FAMILY HISTORY Family History  Problem Relation Age of Onset   Macular degeneration Mother    SOCIAL HISTORY Social  History   Tobacco Use   Smoking status: Every Day    Packs/day: 0.33    Years: 40.00    Total pack years: 13.20    Types: Cigarettes   Smokeless tobacco: Never  Vaping Use   Vaping Use: Never used  Substance Use Topics   Alcohol use: No   Drug use: No       OPHTHALMIC EXAM:  Not recorded    IMAGING AND PROCEDURES  Imaging and Procedures for 10/30/2021          ASSESSMENT/PLAN:  No diagnosis found.  1. 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, right eye  - s/p IVA OD #1 (03.16.23), #2  (04.13.23), #3 (05.11.23), #4 (06.08.23) - pt presented initially on 3.16.23 with 2 wk history of central vision loss and distortion  - initial exam and OCT OD showed large central PED with mild surrounding SRF and mild SRHM overlying - BCVA decreased to 20/70 from 20/60 OD  - OCT shows OD: Persistent central PED/ CNV with overlying SRF and IRF; mild interval improvement in surrounding IRF - discussed possible IVA resistance, and potential switch in medication - recommend IVE OD #1 today, 07.06.23 - pt wishes to proceed with injection - RBA of procedure discussed, questions answered - IVE informed consent obtained and signed, 07.06.23 (OD) - see procedure note  - f/u 4 weeks, DFE, OCT  2. Age related macular degeneration, non-exudative, left eye  - mild/intermediate stage with mild drusen  - The incidence, anatomy, and pathology of dry AMD, risk of progression, and the AREDS and AREDS 2 study including smoking risks discussed with patient.   - Recommend amsler grid monitoring  3. Epiretinal membrane, left eye  - mild ERM OS w/ early pucker, SN macula - BCVA 20/50 - asymptomatic, no metamorphopsia - no indication for surgery at this time - monitor for now  4,5. Hypertensive retinopathy OU - discussed importance of tight BP control - monitor   6. Mixed Cataract OU - The symptoms of cataract, surgical options, and treatments and risks were discussed with patient.  - discussed diagnosis and progression - under the expert management of Dr. Lucianne Lei  Ophthalmic Meds Ordered this visit:  No orders of the defined types were placed in this encounter.    No follow-ups on file.  There are no Patient Instructions on file for this visit.   Explained the diagnoses, plan, and follow up with the patient and they expressed understanding.  Patient expressed understanding of the importance of proper follow up care.   This document serves as a record of services personally performed by Gardiner Sleeper, MD, PhD. It was created on their behalf by San Jetty. Owens Shark, OA an ophthalmic technician. The creation of this record is the provider's dictation and/or activities during the visit.    Electronically signed by: San Jetty. Marguerita Merles 07.19.2023 12:43 PM   Gardiner Sleeper, M.D., Ph.D. Diseases & Surgery of the Retina and Vitreous [facility]: M myopia (nearsighted); A astigmatism; H hyperopia (farsighted); P presbyopia; Mrx spectacle prescription;  CTL contact lenses; OD right eye; OS left eye; OU both eyes  XT exotropia; ET esotropia; PEK punctate epithelial keratitis; PEE punctate epithelial erosions; DES dry eye syndrome; MGD meibomian gland dysfunction; ATs artificial tears; PFAT's preservative free artificial tears; [place] nuclear sclerotic cataract; PSC posterior subcapsular cataract; ERM epi-retinal membrane; PVD posterior vitreous detachment; RD retinal detachment; DM diabetes mellitus; DR diabetic retinopathy; NPDR non-proliferative diabetic retinopathy;  PDR proliferative diabetic retinopathy; CSME clinically significant macular edema; DME diabetic macular edema; dbh dot blot hemorrhages; CWS cotton wool spot; POAG primary open angle glaucoma; C/D cup-to-disc ratio; HVF humphrey visual field; GVF goldmann visual field; OCT optical coherence tomography; IOP intraocular pressure; BRVO Branch retinal vein occlusion; CRVO central retinal vein occlusion; CRAO central retinal artery occlusion; BRAO branch retinal artery occlusion; RT retinal tear; SB scleral buckle; PPV pars plana vitrectomy; VH Vitreous hemorrhage; PRP panretinal laser photocoagulation; IVK intravitreal kenalog; VMT vitreomacular traction; MH Macular hole;  NVD neovascularization of the disc; NVE neovascularization elsewhere; AREDS age related eye disease study; ARMD age related macular degeneration; POAG primary open angle glaucoma; EBMD epithelial/anterior basement membrane dystrophy; ACIOL  anterior chamber intraocular lens; IOL intraocular lens; PCIOL posterior chamber intraocular lens; Phaco/IOL phacoemulsification with intraocular lens placement; PRK photorefractive keratectomy; LASIK laser assisted in situ keratomileusis; HTN hypertension; DM diabetes mellitus; COPD chronic obstructive pulmonary disease

## 2021-11-12 NOTE — Progress Notes (Signed)
[facility] Note  11/14/2021    CHIEF COMPLAINT Patient presents for Retina Follow Up  HISTORY OF PRESENT ILLNESS: Kimberly Noble is a 76 y.o. female who presents to the clinic today for:   HPI     Retina Follow Up   Patient presents with  Wet AMD.  In right eye.  This started 4 weeks ago.  I, the attending physician,  performed the HPI with the patient and updated documentation appropriately.        Comments   Patient here for 4 weeks retina follow up for exu ARMD OD. Patient states vision doing bad. OD is the same. OS is the same. Not good, but can still see. Sees people but harder to read. No eye pain. Got new glasses but not right.      Last edited by Bernarda Caffey, MD on 11/14/2021 10:40 AM.      Referring physician: No referring provider defined for this encounter.  HISTORICAL INFORMATION:   Selected notes from the MEDICAL RECORD NUMBER Referred by Dr. Lucianne Lei for PED OD LEE:  Ocular Hx- PMH-    CURRENT MEDICATIONS: No current outpatient medications on file. (Ophthalmic Drugs)   No current facility-administered medications for this visit. (Ophthalmic Drugs)   Current Outpatient Medications (Other)  Medication Sig   ALPRAZolam (XANAX) 1 MG tablet Take 1 mg by mouth 3 (three) times daily. 7am, 1pm, 7pm   Ascorbic Acid (VITAMIN C PO) Take 1 capsule by mouth daily. supplement from [facility]   Biotin w/ Vitamins C & E (HAIR/SKIN/NAILS PO) Take 1 capsule by mouth daily. supplement from [facility]   CALCIUM CARBONATE-VITAMIN D PO Take 1 capsule by mouth daily. supplement from [facility]   Cholecalciferol (VITAMIN D3 PO) Take 1 capsule by mouth daily. supplement from [facility]   Coenzyme Q10 (COQ10 PO) Take 1 capsule by mouth daily.   CRANBERRY PO Take 1 capsule by mouth daily. supplement from [facility]   Multiple Vitamin (MULTIVITAMIN WITH MINERALS) TABS tablet Take 1 tablet by mouth daily. supplement from [facility]   Omega-3 Fatty Acids (FISH  OIL PO) Take 1 capsule by mouth daily. supplement from [facility]   OVER THE COUNTER MEDICATION Take 1 capsule by mouth daily. Joint support supplement from [facility]   OVER THE COUNTER MEDICATION Take 1 capsule by mouth daily. Cholesterol care supplement from [facility]   OVER THE COUNTER MEDICATION Take 1 capsule by mouth daily. Circulation and vein support supplement from [facility]   OVER THE COUNTER MEDICATION Take 1 capsule by mouth daily. Green vegetable supplement from [employer]   OVER THE COUNTER MEDICATION Take 1 capsule by mouth daily. Liver and brain supplement from [facility]   OVER THE COUNTER MEDICATION Take 1 capsule by mouth daily. Eye vitamin with lutein - supplement from [facility]   VITAMIN K PO Take 1 capsule by mouth daily. supplement from [facility]   No current facility-administered medications for this visit. (Other)   REVIEW OF SYSTEMS: ROS   Positive for: Neurological, Eyes Negative for: Constitutional, Gastrointestinal, Skin, Genitourinary, Musculoskeletal, HENT, Endocrine, Cardiovascular, Respiratory, Psychiatric, Allergic/Imm, Heme/Lymph Last edited by Theodore Demark, COA on 11/14/2021  8:39 AM.      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 Claims she is not allergic to all shellfish.    Penicillins Rash    Has  patient had a PCN reaction causing immediate rash, facial/tongue/throat swelling, SOB or lightheadedness with hypotension: Yes Has patient had a PCN reaction causing severe rash involving mucus membranes or skin necrosis: No Has patient had a PCN reaction that required hospitalization No Has patient had a PCN reaction occurring within the last 10 years: No If all of the above answers are "NO", then may proceed with Cephalosporin use.    PAST MEDICAL HISTORY Past Medical History:  Diagnosis Date   Agoraphobia with panic attacks    Anxiety attack    Major depression     Superficial laceration of hand ~ 03/2015   left thumb   Past Surgical History:  Procedure Laterality Date   TONSILLECTOMY  1950's   FAMILY HISTORY Family History  Problem Relation Age of Onset   Macular degeneration Mother    SOCIAL HISTORY Social History   Tobacco Use   Smoking status: Every Day    Packs/day: 0.33    Years: 40.00    Total pack years: 13.20    Types: Cigarettes   Smokeless tobacco: Never  Vaping Use   Vaping Use: Never used  Substance Use Topics   Alcohol use: No   Drug use: No       OPHTHALMIC EXAM:  Base Eye Exam     Visual Acuity (Snellen - Linear)       Right Left   Dist cc 20/50 -1 20/30 -2   Dist ph cc NI NI    Correction: Glasses         Tonometry (Tonopen, 8:36 AM)       Right Left   Pressure 19 16         Pupils       Dark Light Shape React APD   Right 4 3 Round Brisk None   Left 4 3 Round Brisk None         Visual Fields (Counting fingers)       Left Right    Full Full         Extraocular Movement       Right Left    Full, Ortho Full, Ortho         Neuro/Psych     Oriented x3: Yes   Mood/Affect: Normal         Dilation     Both eyes: 1.0% Mydriacyl, 2.5% Phenylephrine @ 8:35 AM           Slit Lamp and Fundus Exam     Slit Lamp Exam       Right Left   Lids/Lashes Dermatochalasis - upper lid, mild MGD Dermatochalasis - upper lid, mild MGD   Conjunctiva/Sclera White and quiet White and quiet   Cornea mild arcus, 2+ inferior Punctate epithelial erosions mild arcus, 2+ inferior Punctate epithelial erosions   Anterior Chamber Deep and quiet Deep and quiet   Iris Round and dilated Round and dilated   Lens 2-3+ Nuclear sclerosis, 2-3+ Cortical cataract 2-3+ Nuclear sclerosis, 2-3+ Cortical cataract   Anterior Vitreous Mild syneresis Mild syneresis, Posterior vitreous detachment, vitreous condensations         Fundus Exam       Right Left   Disc Pink and Sharp, temporal PPA, Compact  Pink and Sharp, temporal PPA, Compact   C/D Ratio 0.5 0.5   Macula Blunted foveal reflex, +CNV with pigmented RPE rip and surrounding atrophy, persistent SRF, no frank heme Flat, Blunted foveal reflex, fine drusen, RPE mottling and clumping, No heme  or edema   Vessels attenuated, Tortuous mild attenuation, mild tortuosity   Periphery Attached, reticular degeneration, focal pigment clumping IT, No heme Attached, reticular degeneration, focal pigment clumping IT, No heme           Refraction     Wearing Rx       Sphere Cylinder Axis Add   Right -6.00 +1.75 117 +2.50   Left -5.25 +2.25 085 +2.50           IMAGING AND PROCEDURES  Imaging and Procedures for 11/14/2021  OCT, Retina - OU - Both Eyes       Right Eye Quality was good. Central Foveal Thickness: 470. Progression has improved. Findings include abnormal foveal contour, subretinal hyper-reflective material, intraretinal fluid, pigment epithelial detachment, subretinal fluid, vitreomacular adhesion (Interval improvement in central PED/ CNV and overlying SRF; stable improvement in IRF; partial PVD).   Left Eye Quality was good. Central Foveal Thickness: 255. Progression has been stable. Findings include normal foveal contour, no IRF, no SRF, retinal drusen , epiretinal membrane, macular pucker (Very mild drusen, mild ERM with early pucker SN mac).   Notes *Images captured and stored on drive  Diagnosis / Impression:  OD: exu ARMD - Interval improvement in central PED/ CNV and overlying SRF; stable improvement in IRF; partial PVD OS: non-exu ARMD - Very mild drusen, mild ERM with early pucker SN mac  Clinical management:  See below  Abbreviations: NFP - Normal foveal profile. CME - cystoid macular edema. PED - pigment epithelial detachment. IRF - intraretinal fluid. SRF - subretinal fluid. EZ - ellipsoid zone. ERM - epiretinal membrane. ORA - outer retinal atrophy. ORT - outer retinal tubulation. SRHM - subretinal  hyper-reflective material. IRHM - intraretinal hyper-reflective material      Intravitreal Injection, Pharmacologic Agent - OD - Right Eye       Time Out 11/14/2021. 9:07 AM. Confirmed correct patient, procedure, site, and patient consented.   Anesthesia Topical anesthesia was used. Anesthetic medications included Lidocaine 2%, Proparacaine 0.5%.   Procedure Preparation included 5% betadine to ocular surface, eyelid speculum. A (32g) needle was used.   Injection: 2 mg aflibercept 2 MG/0.05ML   Route: Intravitreal, Site: Right Eye   NDC: [id number], Lot: [id number], Expiration date: 01/12/2023, Waste: 0 mL   Post-op Post injection exam found visual acuity of at least counting fingers. The patient tolerated the procedure well. There were no complications. The patient received written and verbal post procedure care education. Post injection medications were not given.             ASSESSMENT/PLAN:    ICD-10-CM   1. Exudative age-related macular degeneration of right eye with active choroidal neovascularization (HCC)  H35.3211 OCT, Retina - OU - Both Eyes    Intravitreal Injection, Pharmacologic Agent - OD - Right Eye    aflibercept (EYLEA) SOLN 2 mg    2. 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 Epiretinal membrane (ERM) of left eye  H35.372     4. Essential hypertension  I10     5. Hypertensive retinopathy of both eyes  H35.033     6. Combined forms of age-related cataract of both eyes  H25.813       1. 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, right eye  - s/p IVA OD #1 (03.16.23), #2 (04.13.23), #3 (05.11.23), #4 (06.08.23) -- IVA resistance  - s/p IVE OD #1 (07.06.23) - pt presented initially on 3.16.23 with 2 wk  history of central vision loss and distortion  - initial exam and OCT OD showed large central PED with mild surrounding SRF and mild SRHM overlying - BCVA 20/50 from 20/70 OD  - OCT shows OD:  Interval improvement in central PED/ CNV and overlying SRF; stable improvement in IRF -- good response to IVE #1 - recommend IVE OD #2 today, 08.03.23 - pt wishes to proceed with injection - RBA of procedure discussed, questions answered - IVE informed consent obtained and signed, 07.06.23 (OD) - see procedure note  - f/u 4 weeks, DFE, OCT  2. Age related macular degeneration, non-exudative, left eye  - mild/intermediate stage with mild drusen  - The incidence, anatomy, and pathology of dry AMD, risk of progression, and the AREDS and AREDS 2 study including smoking risks discussed with patient.   - Recommend amsler grid monitoring  3. Epiretinal membrane, left eye  - mild ERM OS w/ early pucker, SN macula - BCVA 20/30 - asymptomatic, no metamorphopsia - no indication for surgery at this time - monitor for now  4,5. Hypertensive retinopathy OU - discussed importance of tight BP control - monitor   6. Mixed Cataract OU - The symptoms of cataract, surgical options, and treatments and risks were discussed with patient.  - discussed diagnosis and progression - under the expert management of Dr. Lucianne Lei  Ophthalmic Meds Ordered this visit:  Meds ordered this encounter  Medications   aflibercept (EYLEA) SOLN 2 mg     Return in about 4 weeks (around 12/12/2021) for f/u exu ARMD OD, DFE, OCT.  There are no Patient Instructions on file for this visit.   Explained the diagnoses, plan, and follow up with the patient and they expressed understanding.  Patient expressed understanding of the importance of proper follow up care.   This document serves as a record of services personally performed by Gardiner Sleeper, MD, PhD. It was created on their behalf by San Jetty. Owens Shark, OA an ophthalmic technician. The creation of this record is the provider's dictation and/or activities during the visit.    Electronically signed by: San Jetty. Owens Shark, [place] 07.19.2023 10:42 AM   Gardiner Sleeper, M.D.,  Ph.D. Diseases & Surgery of the Retina and Vitreous [facility]  I have reviewed the above documentation for accuracy and completeness, and I agree with the above. Gardiner Sleeper, M.D., Ph.D. 11/14/21 10:46 AM   Abbreviations: M myopia (nearsighted); A astigmatism; H hyperopia (farsighted); P presbyopia; Mrx spectacle prescription;  CTL contact lenses; OD right eye; OS left eye; OU both eyes  XT exotropia; ET esotropia; PEK punctate epithelial keratitis; PEE punctate epithelial erosions; DES dry eye syndrome; MGD meibomian gland dysfunction; ATs artificial tears; PFAT's preservative free artificial tears; [place] nuclear sclerotic cataract; PSC posterior subcapsular cataract; ERM epi-retinal membrane; PVD posterior vitreous detachment; RD retinal detachment; DM diabetes mellitus; DR diabetic retinopathy; NPDR non-proliferative diabetic retinopathy; PDR proliferative diabetic retinopathy; CSME clinically significant macular edema; DME diabetic macular edema; dbh dot blot hemorrhages; CWS cotton wool spot; POAG primary open angle glaucoma; C/D cup-to-disc ratio; HVF humphrey visual field; GVF goldmann visual field; OCT optical coherence tomography; IOP intraocular pressure; BRVO Branch retinal vein occlusion; CRVO central retinal vein occlusion; CRAO central retinal artery occlusion; BRAO branch retinal artery occlusion; RT retinal tear; SB scleral buckle; PPV pars plana vitrectomy; VH Vitreous hemorrhage; PRP panretinal laser photocoagulation; IVK intravitreal kenalog; VMT vitreomacular traction; MH Macular hole;  NVD neovascularization of the disc; NVE neovascularization elsewhere; AREDS  age related eye disease study; ARMD age related macular degeneration; POAG primary open angle glaucoma; EBMD epithelial/anterior basement membrane dystrophy; ACIOL anterior chamber intraocular lens; IOL intraocular lens; PCIOL posterior chamber intraocular lens; Phaco/IOL phacoemulsification with  intraocular lens placement; [place] photorefractive keratectomy; LASIK laser assisted in situ keratomileusis; HTN hypertension; DM diabetes mellitus; COPD chronic obstructive pulmonary disease

## 2021-11-14 ENCOUNTER — Encounter (INDEPENDENT_AMBULATORY_CARE_PROVIDER_SITE_OTHER): Payer: Self-pay | Admitting: Ophthalmology

## 2021-11-14 ENCOUNTER — Ambulatory Visit (INDEPENDENT_AMBULATORY_CARE_PROVIDER_SITE_OTHER): Payer: Medicare Other | Admitting: Ophthalmology

## 2021-11-14 DIAGNOSIS — H35033 Hypertensive retinopathy, bilateral: Secondary | ICD-10-CM

## 2021-11-14 DIAGNOSIS — H353122 Nonexudative age-related macular degeneration, left eye, intermediate dry stage: Secondary | ICD-10-CM

## 2021-11-14 DIAGNOSIS — H353211 Exudative age-related macular degeneration, right eye, with active choroidal neovascularization: Secondary | ICD-10-CM | POA: Diagnosis not present

## 2021-11-14 DIAGNOSIS — I10 Essential (primary) hypertension: Secondary | ICD-10-CM

## 2021-11-14 DIAGNOSIS — H35372 Puckering of macula, left eye: Secondary | ICD-10-CM | POA: Diagnosis not present

## 2021-11-14 DIAGNOSIS — H25813 Combined forms of age-related cataract, bilateral: Secondary | ICD-10-CM

## 2021-11-14 MED ORDER — AFLIBERCEPT 2MG/0.05ML IZ SOLN FOR KALEIDOSCOPE
2.0000 mg | INTRAVITREAL | Status: AC | PRN
  Administered 2021-11-14: 2 mg via INTRAVITREAL

## 2021-11-28 NOTE — Progress Notes (Signed)
[facility] Note  12/12/2021    CHIEF COMPLAINT Patient presents for Retina Follow Up  HISTORY OF PRESENT ILLNESS: Kimberly Noble is a 76 y.o. female who presents to the clinic today for:   HPI     Retina Follow Up   Patient presents with  Wet AMD.  In right eye.  This started months ago.  Duration of 4 weeks.  I, the attending physician,  performed the HPI with the patient and updated documentation appropriately.        Comments   Patient feels that she no longer sees the black spot in the right eye. Her vision is the same. She sees waves and distortion.       Last edited by Bernarda Caffey, MD on 12/12/2021 10:39 AM.    Patient states no longer sees black spot in the right eye. Still sees waviness and distortion OD, but no change in vision overall.   Referring physician: No referring provider defined for this encounter.  HISTORICAL INFORMATION:   Selected notes from the MEDICAL RECORD NUMBER Referred by Dr. Lucianne Lei for PED OD LEE:  Ocular Hx- PMH-    CURRENT MEDICATIONS: No current outpatient medications on file. (Ophthalmic Drugs)   No current facility-administered medications for this visit. (Ophthalmic Drugs)   Current Outpatient Medications (Other)  Medication Sig   ALPRAZolam (XANAX) 1 MG tablet Take 1 mg by mouth 3 (three) times daily. 7am, 1pm, 7pm   Ascorbic Acid (VITAMIN C PO) Take 1 capsule by mouth daily. supplement from [facility]   Biotin w/ Vitamins C & E (HAIR/SKIN/NAILS PO) Take 1 capsule by mouth daily. supplement from [facility]   CALCIUM CARBONATE-VITAMIN D PO Take 1 capsule by mouth daily. supplement from [facility]   Cholecalciferol (VITAMIN D3 PO) Take 1 capsule by mouth daily. supplement from [facility]   Coenzyme Q10 (COQ10 PO) Take 1 capsule by mouth daily.   CRANBERRY PO Take 1 capsule by mouth daily. supplement from [facility]   Multiple Vitamin (MULTIVITAMIN WITH MINERALS) TABS tablet Take 1 tablet by mouth daily.  supplement from [facility]   Omega-3 Fatty Acids (FISH OIL PO) Take 1 capsule by mouth daily. supplement from [facility]   OVER THE COUNTER MEDICATION Take 1 capsule by mouth daily. Joint support supplement from [facility]   OVER THE COUNTER MEDICATION Take 1 capsule by mouth daily. Cholesterol care supplement from [facility]   OVER THE COUNTER MEDICATION Take 1 capsule by mouth daily. Circulation and vein support supplement from [facility]   OVER THE COUNTER MEDICATION Take 1 capsule by mouth daily. Green vegetable supplement from [employer]   OVER THE COUNTER MEDICATION Take 1 capsule by mouth daily. Liver and brain supplement from [facility]   OVER THE COUNTER MEDICATION Take 1 capsule by mouth daily. Eye vitamin with lutein - supplement from [facility]   VITAMIN K PO Take 1 capsule by mouth daily. supplement from [facility]   No current facility-administered medications for this visit. (Other)   REVIEW OF SYSTEMS: ROS   Positive for: Neurological, Eyes Negative for: Constitutional, Gastrointestinal, Skin, Genitourinary, Musculoskeletal, HENT, Endocrine, Cardiovascular, Respiratory, Psychiatric, Allergic/Imm, Heme/Lymph Last edited by Annie Paras, COT on 12/12/2021  9:02 AM.     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 Claims she is not allergic to all shellfish.    Penicillins  Rash    Has patient had a PCN reaction causing immediate rash, facial/tongue/throat swelling, SOB or lightheadedness with hypotension: Yes Has patient had a PCN reaction causing severe rash involving mucus membranes or skin necrosis: No Has patient had a PCN reaction that required hospitalization No Has patient had a PCN reaction occurring within the last 10 years: No If all of the above answers are "NO", then may proceed with Cephalosporin use.    PAST MEDICAL HISTORY Past Medical History:  Diagnosis Date   Agoraphobia with panic  attacks    Anxiety attack    Major depression    Superficial laceration of hand ~ 03/2015   left thumb   Past Surgical History:  Procedure Laterality Date   TONSILLECTOMY  1950's   FAMILY HISTORY Family History  Problem Relation Age of Onset   Macular degeneration Mother    SOCIAL HISTORY Social History   Tobacco Use   Smoking status: Every Day    Packs/day: 0.33    Years: 40.00    Total pack years: 13.20    Types: Cigarettes   Smokeless tobacco: Never  Vaping Use   Vaping Use: Never used  Substance Use Topics   Alcohol use: No   Drug use: No       OPHTHALMIC EXAM:  Base Eye Exam     Visual Acuity (Snellen - Linear)       Right Left   Dist cc 20/50 20/40   Dist ph cc NI NI    Correction: Glasses         Tonometry (Tonopen, 9:04 AM)       Right Left   Pressure 19 17         Pupils       Dark Light Shape React APD   Right 4 3 Round Brisk None   Left 4 3 Round Brisk None         Visual Fields       Left Right    Full Full         Extraocular Movement       Right Left    Full, Ortho Full, Ortho         Neuro/Psych     Oriented x3: Yes   Mood/Affect: Normal         Dilation     Both eyes: 1.0% Mydriacyl, 2.5% Phenylephrine @ 9:03 AM           Slit Lamp and Fundus Exam     Slit Lamp Exam       Right Left   Lids/Lashes Dermatochalasis - upper lid, mild MGD Dermatochalasis - upper lid, mild MGD   Conjunctiva/Sclera White and quiet White and quiet   Cornea mild arcus, 2+ inferior Punctate epithelial erosions mild arcus, 2+ inferior Punctate epithelial erosions   Anterior Chamber Deep and quiet Deep and quiet   Iris Round and dilated Round and dilated   Lens 2-3+ Nuclear sclerosis, 2-3+ Cortical cataract 2-3+ Nuclear sclerosis, 2-3+ Cortical cataract   Anterior Vitreous Mild syneresis Mild syneresis, Posterior vitreous detachment, vitreous condensations         Fundus Exam       Right Left   Disc Pink and  Sharp, temporal PPA, Compact Pink and Sharp, temporal PPA, Compact   C/D Ratio 0.5 0.5   Macula Blunted foveal reflex, +CNV with pigmented RPE rip and surrounding atrophy, persistent SRF, no frank heme Flat, Blunted foveal reflex, fine drusen, RPE mottling and clumping, No  heme or edema   Vessels attenuated, Tortuous mild attenuation, mild tortuosity   Periphery Attached, reticular degeneration, focal pigment clumping IT, No heme Attached, reticular degeneration, focal pigment clumping IT, No heme           Refraction     Wearing Rx       Sphere Cylinder Axis Add   Right -6.00 +1.75 117 +2.50   Left -5.25 +2.25 085 +2.50           IMAGING AND PROCEDURES  Imaging and Procedures for 12/12/2021  OCT, Retina - OU - Both Eyes       Right Eye Quality was good. Central Foveal Thickness: 460. Progression has been stable. Findings include abnormal foveal contour, subretinal hyper-reflective material, intraretinal fluid, pigment epithelial detachment, subretinal fluid, vitreomacular adhesion (stable improvement in central PED/ CNV and IRF; persistent overlying SRF; partial PVD).   Left Eye Quality was good. Central Foveal Thickness: 255. Progression has been stable. Findings include normal foveal contour, no IRF, no SRF, retinal drusen , epiretinal membrane, macular pucker (Very mild drusen, mild ERM with early pucker SN mac).   Notes *Images captured and stored on drive  Diagnosis / Impression:  OD: exu ARMD - stable improvement in central PED/ CNV and IRF; persistent SRF overlying; partial PVD OS: non-exu ARMD - Very mild drusen, mild ERM with early pucker SN mac  Clinical management:  See below  Abbreviations: NFP - Normal foveal profile. CME - cystoid macular edema. PED - pigment epithelial detachment. IRF - intraretinal fluid. SRF - subretinal fluid. EZ - ellipsoid zone. ERM - epiretinal membrane. ORA - outer retinal atrophy. ORT - outer retinal tubulation. SRHM - subretinal  hyper-reflective material. IRHM - intraretinal hyper-reflective material      Intravitreal Injection, Pharmacologic Agent - OD - Right Eye       Time Out 12/12/2021. 10:12 AM. Confirmed correct patient, procedure, site, and patient consented.   Anesthesia Topical anesthesia was used. Anesthetic medications included Lidocaine 2%, Proparacaine 0.5%.   Procedure Preparation included 5% betadine to ocular surface, eyelid speculum. A (32g) needle was used.   Injection: 2 mg aflibercept 2 MG/0.05ML   Route: Intravitreal, Site: Right Eye   NDC: [id number], Lot: [id number], Expiration date: 06/11/2022, Waste: 0 mL   Post-op Post injection exam found visual acuity of at least counting fingers. The patient tolerated the procedure well. There were no complications. The patient received written and verbal post procedure care education. Post injection medications were not given.            ASSESSMENT/PLAN:    ICD-10-CM   1. Exudative age-related macular degeneration of right eye with active choroidal neovascularization (HCC)  H35.3211 OCT, Retina - OU - Both Eyes    Intravitreal Injection, Pharmacologic Agent - OD - Right Eye    aflibercept (EYLEA) SOLN 2 mg    2. 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 Epiretinal membrane (ERM) of left eye  H35.372     4. Essential hypertension  I10     5. Hypertensive retinopathy of both eyes  H35.033     6. Combined forms of age-related cataract of both eyes  H25.813      1. 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, right eye  - s/p IVA OD #1 (03.16.23), #2 (04.13.23), #3 (05.11.23), #4 (06.08.23) -- IVA resistance  - s/p IVE OD #1 (07.06.23), #2 (08.03.23) - pt presented initially on 3.16.23 with 2 wk history of  central vision loss and distortion  - initial exam and OCT OD showed large central PED with mild surrounding SRF and mild SRHM overlying - BCVA stable at 20/50 OD - OCT shows  OD: stable improvement in central PED/ CNV; persistent SRF overlying; stable improvement in IRF - recommend IVE OD #3 today, 08.31.23 - pt wishes to proceed with injection - RBA of procedure discussed, questions answered - IVE informed consent obtained and signed, 07.06.23 (OD) - see procedure note  - f/u 4 weeks, DFE, OCT  2. Age related macular degeneration, non-exudative, left eye  - mild/intermediate stage with mild drusen  - The incidence, anatomy, and pathology of dry AMD, risk of progression, and the AREDS and AREDS 2 study including smoking risks discussed with patient.   - Recommend amsler grid monitoring  3. Epiretinal membrane, left eye  - mild ERM OS w/ early pucker, SN macula - BCVA 20/30 - asymptomatic, no metamorphopsia - no indication for surgery at this time - monitor for now  4,5. Hypertensive retinopathy OU - discussed importance of tight BP control - monitor   6. Mixed Cataract OU - The symptoms of cataract, surgical options, and treatments and risks were discussed with patient.  - discussed diagnosis and progression - under the expert management of Dr. Lucianne Lei  Ophthalmic Meds Ordered this visit:  Meds ordered this encounter  Medications   aflibercept (EYLEA) SOLN 2 mg     Return in about 4 weeks (around 01/09/2022) for DFE, OCT.  There are no Patient Instructions on file for this visit.   Explained the diagnoses, plan, and follow up with the patient and they expressed understanding.  Patient expressed understanding of the importance of proper follow up care.   This document serves as a record of services personally performed by Gardiner Sleeper, MD, PhD. It was created on their behalf by San Jetty. Owens Shark, OA an ophthalmic technician. The creation of this record is the provider's dictation and/or activities during the visit.    Electronically signed by: San Jetty. Owens Shark, [place] 08.17.2023 10:42 AM  This document serves as a record of services personally  performed by Gardiner Sleeper, MD, PhD. It was created on their behalf by Roselee Nova, COMT. The creation of this record is the provider's dictation and/or activities during the visit.  Electronically signed by: Roselee Nova, COMT 12/12/21 10:42 AM  Gardiner Sleeper, M.D., Ph.D. Diseases & Surgery of the Retina and Vitreous [facility]  I have reviewed the above documentation for accuracy and completeness, and I agree with the above. Gardiner Sleeper, M.D., Ph.D. 12/12/21 10:42 AM  Abbreviations: M myopia (nearsighted); A astigmatism; H hyperopia (farsighted); P presbyopia; Mrx spectacle prescription;  CTL contact lenses; OD right eye; OS left eye; OU both eyes  XT exotropia; ET esotropia; PEK punctate epithelial keratitis; PEE punctate epithelial erosions; DES dry eye syndrome; MGD meibomian gland dysfunction; ATs artificial tears; PFAT's preservative free artificial tears; [place] nuclear sclerotic cataract; PSC posterior subcapsular cataract; ERM epi-retinal membrane; PVD posterior vitreous detachment; RD retinal detachment; DM diabetes mellitus; DR diabetic retinopathy; NPDR non-proliferative diabetic retinopathy; PDR proliferative diabetic retinopathy; CSME clinically significant macular edema; DME diabetic macular edema; dbh dot blot hemorrhages; CWS cotton wool spot; POAG primary open angle glaucoma; C/D cup-to-disc ratio; HVF humphrey visual field; GVF goldmann visual field; OCT optical coherence tomography; IOP intraocular pressure; BRVO Branch retinal vein occlusion; CRVO central retinal vein occlusion; CRAO central retinal artery occlusion; BRAO branch retinal artery occlusion;  RT retinal tear; SB scleral buckle; PPV pars plana vitrectomy; VH Vitreous hemorrhage; PRP panretinal laser photocoagulation; IVK intravitreal kenalog; VMT vitreomacular traction; MH Macular hole;  NVD neovascularization of the disc; NVE neovascularization elsewhere; AREDS age related eye disease study;  ARMD age related macular degeneration; POAG primary open angle glaucoma; EBMD epithelial/anterior basement membrane dystrophy; ACIOL anterior chamber intraocular lens; IOL intraocular lens; PCIOL posterior chamber intraocular lens; Phaco/IOL phacoemulsification with intraocular lens placement; [place] photorefractive keratectomy; LASIK laser assisted in situ keratomileusis; HTN hypertension; DM diabetes mellitus; COPD chronic obstructive pulmonary disease

## 2021-12-12 ENCOUNTER — Ambulatory Visit (INDEPENDENT_AMBULATORY_CARE_PROVIDER_SITE_OTHER): Payer: Medicare Other | Admitting: Ophthalmology

## 2021-12-12 ENCOUNTER — Encounter (INDEPENDENT_AMBULATORY_CARE_PROVIDER_SITE_OTHER): Payer: Self-pay | Admitting: Ophthalmology

## 2021-12-12 DIAGNOSIS — H353122 Nonexudative age-related macular degeneration, left eye, intermediate dry stage: Secondary | ICD-10-CM

## 2021-12-12 DIAGNOSIS — I10 Essential (primary) hypertension: Secondary | ICD-10-CM | POA: Diagnosis not present

## 2021-12-12 DIAGNOSIS — H35033 Hypertensive retinopathy, bilateral: Secondary | ICD-10-CM

## 2021-12-12 DIAGNOSIS — H35372 Puckering of macula, left eye: Secondary | ICD-10-CM

## 2021-12-12 DIAGNOSIS — H353211 Exudative age-related macular degeneration, right eye, with active choroidal neovascularization: Secondary | ICD-10-CM | POA: Diagnosis not present

## 2021-12-12 DIAGNOSIS — H25813 Combined forms of age-related cataract, bilateral: Secondary | ICD-10-CM

## 2021-12-12 MED ORDER — AFLIBERCEPT 2MG/0.05ML IZ SOLN FOR KALEIDOSCOPE
2.0000 mg | INTRAVITREAL | Status: AC | PRN
  Administered 2021-12-12: 2 mg via INTRAVITREAL

## 2022-01-09 ENCOUNTER — Encounter (INDEPENDENT_AMBULATORY_CARE_PROVIDER_SITE_OTHER): Payer: Medicare Other | Admitting: Ophthalmology

## 2022-01-09 DIAGNOSIS — I10 Essential (primary) hypertension: Secondary | ICD-10-CM

## 2022-01-09 DIAGNOSIS — H35372 Puckering of macula, left eye: Secondary | ICD-10-CM

## 2022-01-09 DIAGNOSIS — H353211 Exudative age-related macular degeneration, right eye, with active choroidal neovascularization: Secondary | ICD-10-CM

## 2022-01-09 DIAGNOSIS — H25813 Combined forms of age-related cataract, bilateral: Secondary | ICD-10-CM

## 2022-01-09 DIAGNOSIS — H35033 Hypertensive retinopathy, bilateral: Secondary | ICD-10-CM

## 2022-01-09 DIAGNOSIS — H353122 Nonexudative age-related macular degeneration, left eye, intermediate dry stage: Secondary | ICD-10-CM

## 2022-01-10 NOTE — Progress Notes (Signed)
[facility] Note  01/16/2022    CHIEF COMPLAINT Patient presents for Retina Follow Up  HISTORY OF PRESENT ILLNESS: Kimberly Noble is a 76 y.o. female who presents to the clinic today for:   HPI     Retina Follow Up   Patient presents with  Wet AMD.  In right eye.  This started months ago.  Duration of 5 weeks.  I, the attending physician,  performed the HPI with the patient and updated documentation appropriately.        Comments   Pt is reporting that her vision seems stable at this time       Last edited by Bernarda Caffey, MD on 01/16/2022 10:45 PM.    Patient is delayed to follow up from 4 weeks to 5 weeks  Referring physician: No referring provider defined for this encounter.  HISTORICAL INFORMATION:   Selected notes from the MEDICAL RECORD NUMBER Referred by Dr. Lucianne Lei for PED OD LEE:  Ocular Hx- PMH-    CURRENT MEDICATIONS: No current outpatient medications on file. (Ophthalmic Drugs)   No current facility-administered medications for this visit. (Ophthalmic Drugs)   Current Outpatient Medications (Other)  Medication Sig   ALPRAZolam (XANAX) 1 MG tablet Take 1 mg by mouth 3 (three) times daily. 7am, 1pm, 7pm   Ascorbic Acid (VITAMIN C PO) Take 1 capsule by mouth daily. supplement from [facility]   Biotin w/ Vitamins C & E (HAIR/SKIN/NAILS PO) Take 1 capsule by mouth daily. supplement from [facility]   CALCIUM CARBONATE-VITAMIN D PO Take 1 capsule by mouth daily. supplement from [facility]   Cholecalciferol (VITAMIN D3 PO) Take 1 capsule by mouth daily. supplement from [facility]   Coenzyme Q10 (COQ10 PO) Take 1 capsule by mouth daily.   CRANBERRY PO Take 1 capsule by mouth daily. supplement from [facility]   Multiple Vitamin (MULTIVITAMIN WITH MINERALS) TABS tablet Take 1 tablet by mouth daily. supplement from [facility]   Omega-3 Fatty Acids (FISH OIL PO) Take 1 capsule by mouth daily. supplement from [facility]   OVER THE COUNTER MEDICATION  Take 1 capsule by mouth daily. Joint support supplement from [facility]   OVER THE COUNTER MEDICATION Take 1 capsule by mouth daily. Cholesterol care supplement from [facility]   OVER THE COUNTER MEDICATION Take 1 capsule by mouth daily. Circulation and vein support supplement from [facility]   OVER THE COUNTER MEDICATION Take 1 capsule by mouth daily. Green vegetable supplement from [employer]   OVER THE COUNTER MEDICATION Take 1 capsule by mouth daily. Liver and brain supplement from [facility]   OVER THE COUNTER MEDICATION Take 1 capsule by mouth daily. Eye vitamin with lutein - supplement from [facility]   VITAMIN K PO Take 1 capsule by mouth daily. supplement from [facility]   No current facility-administered medications for this visit. (Other)   REVIEW OF SYSTEMS: ROS   Positive for: Eyes Last edited by Bernarda Caffey, MD on 01/16/2022 10:45 PM.     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 Claims she is not allergic to all shellfish.    Penicillins Rash    Has patient had a PCN reaction causing immediate rash, facial/tongue/throat swelling, SOB or lightheadedness with hypotension: Yes Has patient had a PCN reaction causing severe rash involving mucus membranes or skin necrosis: No Has patient had a PCN  reaction that required hospitalization No Has patient had a PCN reaction occurring within the last 10 years: No If all of the above answers are "NO", then may proceed with Cephalosporin use.    PAST MEDICAL HISTORY Past Medical History:  Diagnosis Date   Agoraphobia with panic attacks    Anxiety attack    Major depression    Superficial laceration of hand ~ 03/2015   left thumb   Past Surgical History:  Procedure Laterality Date   TONSILLECTOMY  1950's   FAMILY HISTORY Family History  Problem Relation Age of Onset   Macular degeneration Mother    SOCIAL HISTORY Social History   Tobacco Use    Smoking status: Every Day    Packs/day: 0.33    Years: 40.00    Total pack years: 13.20    Types: Cigarettes   Smokeless tobacco: Never  Vaping Use   Vaping Use: Never used  Substance Use Topics   Alcohol use: No   Drug use: No       OPHTHALMIC EXAM:  Base Eye Exam     Visual Acuity (Snellen - Linear)       Right Left   Dist cc 20/50 -3 20/40  Forgot glass [facility] checked in phortor        Tonometry (Tonopen, 8:58 AM)       Right Left   Pressure 13 14         Pupils       Pupils Dark Light Shape React APD   Right PERRL 4 3 Round Sluggish None   Left PERRL 4 3 Round Sluggish None         Visual Fields       Left Right    Full Full         Extraocular Movement       Right Left    Full Full         Neuro/Psych     Oriented x3: Yes   Mood/Affect: Normal         Dilation     Both eyes: 1.0% Mydriacyl, 2.5% Phenylephrine @ 8:58 AM           Slit Lamp and Fundus Exam     Slit Lamp Exam       Right Left   Lids/Lashes Dermatochalasis - upper lid, mild MGD Dermatochalasis - upper lid, mild MGD   Conjunctiva/Sclera White and quiet White and quiet   Cornea mild arcus, 2+ inferior Punctate epithelial erosions mild arcus, 2+ inferior Punctate epithelial erosions   Anterior Chamber Deep and quiet Deep and quiet   Iris Round and dilated Round and dilated   Lens 2-3+ Nuclear sclerosis, 2-3+ Cortical cataract 2-3+ Nuclear sclerosis, 2-3+ Cortical cataract   Anterior Vitreous Mild syneresis Mild syneresis, Posterior vitreous detachment, vitreous condensations         Fundus Exam       Right Left   Disc Pink and Sharp, temporal PPA, Compact Pink and Sharp, temporal PPA, Compact   C/D Ratio 0.5 0.5   Macula Blunted foveal reflex, +CNV with pigmented RPE rip and surrounding atrophy, persistent SRF -- slightly increased, no frank heme Flat, Blunted foveal reflex, fine drusen, RPE mottling and clumping, No heme or edema   Vessels attenuated,  Tortuous attenuated, Tortuous   Periphery Attached, reticular degeneration, focal pigment clumping IT, No heme Attached, reticular degeneration, focal pigment clumping IT, No heme           Refraction  Wearing Rx       Sphere Cylinder Axis Add   Right -6.00 +1.75 117 +2.50   Left -5.25 +2.25 085 +2.50           IMAGING AND PROCEDURES  Imaging and Procedures for 01/16/2022  OCT, Retina - OU - Both Eyes       Right Eye Quality was good. Central Foveal Thickness: 484. Progression has worsened. Findings include abnormal foveal contour, subretinal hyper-reflective material, intraretinal fluid, pigment epithelial detachment, subretinal fluid, vitreomacular adhesion (stable improvement in central PED/ CNV and IRF; mild interval increase in SRF overlying stable PED; partial PVD).   Left Eye Quality was good. Central Foveal Thickness: 263. Progression has been stable. Findings include normal foveal contour, no IRF, no SRF, retinal drusen , epiretinal membrane, macular pucker (Very mild drusen, mild ERM with early pucker SN mac).   Notes *Images captured and stored on drive  Diagnosis / Impression:  OD: exu ARMD - mild interval increase in SRF overlying stable PED; partial PVD OS: non-exu ARMD - Very mild drusen, mild ERM with early pucker SN mac  Clinical management:  See below  Abbreviations: NFP - Normal foveal profile. CME - cystoid macular edema. PED - pigment epithelial detachment. IRF - intraretinal fluid. SRF - subretinal fluid. EZ - ellipsoid zone. ERM - epiretinal membrane. ORA - outer retinal atrophy. ORT - outer retinal tubulation. SRHM - subretinal hyper-reflective material. IRHM - intraretinal hyper-reflective material      Intravitreal Injection, Pharmacologic Agent - OD - Right Eye       Time Out 01/16/2022. 9:31 AM. Confirmed correct patient, procedure, site, and patient consented.   Anesthesia Topical anesthesia was used. Anesthetic medications included  Lidocaine 2%, Proparacaine 0.5%.   Procedure Preparation included 5% betadine to ocular surface, eyelid speculum. A (32g) needle was used.   Injection: 2 mg aflibercept 2 MG/0.05ML   Route: Intravitreal, Site: Right Eye   NDC: [id number], Lot: [id number], Expiration date: 02/12/2023, Waste: 0 mL   Post-op Post injection exam found visual acuity of at least counting fingers. The patient tolerated the procedure well. There were no complications. The patient received written and verbal post procedure care education. Post injection medications were not given.            ASSESSMENT/PLAN:    ICD-10-CM   1. Exudative age-related macular degeneration of right eye with active choroidal neovascularization (HCC)  H35.3211 OCT, Retina - OU - Both Eyes    Intravitreal Injection, Pharmacologic Agent - OD - Right Eye    aflibercept (EYLEA) SOLN 2 mg    2. 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 Epiretinal membrane (ERM) of left eye  H35.372     4. Essential hypertension  I10     5. Hypertensive retinopathy of both eyes  H35.033     6. Combined forms of age-related cataract of both eyes  H25.813      1. 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, right eye  - s/p IVA OD #1 (03.16.23), #2 (04.13.23), #3 (05.11.23), #4 (06.08.23) -- IVA resistance  - s/p IVE OD #1 (07.06.23), #2 (08.03.23), #3 (08.31.23) - pt presented initially on 3.16.23 with 2 wk history of central vision loss and distortion  - initial exam and OCT OD showed large central PED with mild surrounding SRF and mild SRHM overlying - BCVA stable at 20/50 OD - OCT shows OD: mild interval increase in SRF overlying stable PED; partial PVD -  discussed possible resistance to IVE -- will check insurance auth for Vabysmo - recommend IVE OD #4 today, 10.05.23 - pt wishes to proceed with injection - RBA of procedure discussed, questions answered - IVE informed consent obtained  and signed, 07.06.23 (OD) - see procedure note  - f/u 4 weeks, DFE, OCT, possible injection IVE vs IVV  2. Age related macular degeneration, non-exudative, left eye  - mild/intermediate stage with mild drusen  - The incidence, anatomy, and pathology of dry AMD, risk of progression, and the AREDS and AREDS 2 study including smoking risks discussed with patient.   - Recommend amsler grid monitoring  3. Epiretinal membrane, left eye  - mild ERM OS w/ early pucker, SN macula - BCVA 20/40 - asymptomatic, no metamorphopsia - no indication for surgery at this time - monitor for now  4,5. Hypertensive retinopathy OU - discussed importance of tight BP control - monitor   6. Mixed Cataract OU - The symptoms of cataract, surgical options, and treatments and risks were discussed with patient.  - discussed diagnosis and progression - under the expert management of Dr. Lucianne Lei  Ophthalmic Meds Ordered this visit:  Meds ordered this encounter  Medications   aflibercept (EYLEA) SOLN 2 mg     Return in about 4 weeks (around 02/13/2022) for f/u exu ARMD OD, DFE, OCT.  There are no Patient Instructions on file for this visit.   Explained the diagnoses, plan, and follow up with the patient and they expressed understanding.  Patient expressed understanding of the importance of proper follow up care.   This document serves as a record of services personally performed by Gardiner Sleeper, MD, PhD. It was created on their behalf by San Jetty. Owens Shark, OA an ophthalmic technician. The creation of this record is the provider's dictation and/or activities during the visit.    Electronically signed by: San Jetty. Owens Shark, [place] 09.29.2023 10:46 PM   Gardiner Sleeper, M.D., Ph.D. Diseases & Surgery of the Retina and Vitreous [facility]  I have reviewed the above documentation for accuracy and completeness, and I agree with the above. Gardiner Sleeper, M.D., Ph.D. 01/16/22 10:48  PM   Abbreviations: M myopia (nearsighted); A astigmatism; H hyperopia (farsighted); P presbyopia; Mrx spectacle prescription;  CTL contact lenses; OD right eye; OS left eye; OU both eyes  XT exotropia; ET esotropia; PEK punctate epithelial keratitis; PEE punctate epithelial erosions; DES dry eye syndrome; MGD meibomian gland dysfunction; ATs artificial tears; PFAT's preservative free artificial tears; [place] nuclear sclerotic cataract; PSC posterior subcapsular cataract; ERM epi-retinal membrane; PVD posterior vitreous detachment; RD retinal detachment; DM diabetes mellitus; DR diabetic retinopathy; NPDR non-proliferative diabetic retinopathy; PDR proliferative diabetic retinopathy; CSME clinically significant macular edema; DME diabetic macular edema; dbh dot blot hemorrhages; CWS cotton wool spot; POAG primary open angle glaucoma; C/D cup-to-disc ratio; HVF humphrey visual field; GVF goldmann visual field; OCT optical coherence tomography; IOP intraocular pressure; BRVO Branch retinal vein occlusion; CRVO central retinal vein occlusion; CRAO central retinal artery occlusion; BRAO branch retinal artery occlusion; RT retinal tear; SB scleral buckle; PPV pars plana vitrectomy; VH Vitreous hemorrhage; PRP panretinal laser photocoagulation; IVK intravitreal kenalog; VMT vitreomacular traction; MH Macular hole;  NVD neovascularization of the disc; NVE neovascularization elsewhere; AREDS age related eye disease study; ARMD age related macular degeneration; POAG primary open angle glaucoma; EBMD epithelial/anterior basement membrane dystrophy; ACIOL anterior chamber intraocular lens; IOL intraocular lens; PCIOL posterior chamber intraocular lens; Phaco/IOL phacoemulsification with intraocular lens placement;  [place] photorefractive keratectomy; LASIK laser assisted in situ keratomileusis; HTN hypertension; DM diabetes mellitus; COPD chronic obstructive pulmonary disease

## 2022-01-16 ENCOUNTER — Encounter (INDEPENDENT_AMBULATORY_CARE_PROVIDER_SITE_OTHER): Payer: Self-pay | Admitting: Ophthalmology

## 2022-01-16 ENCOUNTER — Ambulatory Visit (INDEPENDENT_AMBULATORY_CARE_PROVIDER_SITE_OTHER): Payer: Medicare Other | Admitting: Ophthalmology

## 2022-01-16 DIAGNOSIS — H353211 Exudative age-related macular degeneration, right eye, with active choroidal neovascularization: Secondary | ICD-10-CM | POA: Diagnosis not present

## 2022-01-16 DIAGNOSIS — H35372 Puckering of macula, left eye: Secondary | ICD-10-CM | POA: Diagnosis not present

## 2022-01-16 DIAGNOSIS — I10 Essential (primary) hypertension: Secondary | ICD-10-CM | POA: Diagnosis not present

## 2022-01-16 DIAGNOSIS — H353122 Nonexudative age-related macular degeneration, left eye, intermediate dry stage: Secondary | ICD-10-CM

## 2022-01-16 DIAGNOSIS — H25813 Combined forms of age-related cataract, bilateral: Secondary | ICD-10-CM

## 2022-01-16 DIAGNOSIS — H35033 Hypertensive retinopathy, bilateral: Secondary | ICD-10-CM

## 2022-01-16 MED ORDER — AFLIBERCEPT 2MG/0.05ML IZ SOLN FOR KALEIDOSCOPE
2.0000 mg | INTRAVITREAL | Status: AC | PRN
  Administered 2022-01-16: 2 mg via INTRAVITREAL

## 2022-02-04 NOTE — Progress Notes (Shared)
[facility] Clinic Note  02/18/2022    CHIEF COMPLAINT Patient presents for No chief complaint on file.  HISTORY OF PRESENT ILLNESS: Kimberly Noble is a 76 y.o. female who presents to the clinic today for:    Patient is delayed to follow up from 4 weeks to 5 weeks  Referring physician: No referring provider defined for this encounter.  HISTORICAL INFORMATION:   Selected notes from the MEDICAL RECORD NUMBER Referred by Dr. Zenaida Niece for PED OD LEE:  Ocular Hx- PMH-    CURRENT MEDICATIONS: No current outpatient medications on file. (Ophthalmic Drugs)   No current facility-administered medications for this visit. (Ophthalmic Drugs)   Current Outpatient Medications (Other)  Medication Sig   ALPRAZolam (XANAX) 1 MG tablet Take 1 mg by mouth 3 (three) times daily. 7am, 1pm, 7pm   Ascorbic Acid (VITAMIN C PO) Take 1 capsule by mouth daily. supplement from [facility]   Biotin w/ Vitamins C & E (HAIR/SKIN/NAILS PO) Take 1 capsule by mouth daily. supplement from [facility]   CALCIUM CARBONATE-VITAMIN D PO Take 1 capsule by mouth daily. supplement from [facility]   Cholecalciferol (VITAMIN D3 PO) Take 1 capsule by mouth daily. supplement from [facility]   Coenzyme Q10 (COQ10 PO) Take 1 capsule by mouth daily.   CRANBERRY PO Take 1 capsule by mouth daily. supplement from [facility]   Multiple Vitamin (MULTIVITAMIN WITH MINERALS) TABS tablet Take 1 tablet by mouth daily. supplement from [facility]   Omega-3 Fatty Acids (FISH OIL PO) Take 1 capsule by mouth daily. supplement from [facility]   OVER THE COUNTER MEDICATION Take 1 capsule by mouth daily. Joint support supplement from [facility]   OVER THE COUNTER MEDICATION Take 1 capsule by mouth daily. Cholesterol care supplement from [facility]   OVER THE COUNTER MEDICATION Take 1 capsule by mouth daily. Circulation and vein support supplement from [facility]   OVER THE COUNTER MEDICATION Take 1 capsule by mouth daily. Green vegetable  supplement from [employer]   OVER THE COUNTER MEDICATION Take 1 capsule by mouth daily. Liver and brain supplement from [facility]   OVER THE COUNTER MEDICATION Take 1 capsule by mouth daily. Eye vitamin with lutein - supplement from [facility]   VITAMIN K PO Take 1 capsule by mouth daily. supplement from [facility]   No current facility-administered medications for this visit. 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 Claims she is not allergic to all shellfish.    Penicillins Rash    Has patient had a PCN reaction causing immediate rash, facial/tongue/throat swelling, SOB or lightheadedness with hypotension: Yes Has patient had a PCN reaction causing severe rash involving mucus membranes or skin necrosis: No Has patient had a PCN reaction that required hospitalization No Has patient had a PCN reaction occurring within the last 10 years: No If all of the above answers are "NO", then may proceed with Cephalosporin use.    PAST MEDICAL HISTORY Past Medical History:  Diagnosis Date   Agoraphobia with panic attacks    Anxiety attack    Major depression    Superficial laceration of hand ~ 03/2015   left thumb   Past Surgical History:  Procedure Laterality Date   TONSILLECTOMY  1950's   FAMILY HISTORY Family History  Problem Relation Age of Onset  Macular degeneration Mother    SOCIAL HISTORY Social History   Tobacco Use   Smoking status: Every Day    Packs/day: 0.33    Years: 40.00    Total pack years: 13.20    Types: Cigarettes   Smokeless tobacco: Never  Vaping Use   Vaping Use: Never used  Substance Use Topics   Alcohol use: No   Drug use: No       OPHTHALMIC EXAM:  Not recorded    IMAGING AND PROCEDURES  Imaging and Procedures for 02/18/2022          ASSESSMENT/PLAN:    ICD-10-CM   1. Exudative age-related macular degeneration of right eye  with active choroidal neovascularization ([place])  H35.3211     2. 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 Epiretinal membrane (ERM) of left eye  H35.372     4. Essential hypertension  I10     5. Hypertensive retinopathy of both eyes  H35.033     6. Combined forms of age-related cataract of both eyes  H25.813       1. 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, right eye  - s/p IVA OD #1 (03.16.23), #2 (04.13.23), #3 (05.11.23), #4 (06.08.23) -- IVA resistance  - s/p IVE OD #1 (07.06.23), #2 (08.03.23), #3 (08.31.23), #4 (10.05.23) - pt presented initially on 3.16.23 with 2 wk history of central vision loss and distortion  - initial exam and OCT OD showed large central PED with mild surrounding SRF and mild SRHM overlying - BCVA stable at 20/50 OD - OCT shows OD: mild interval increase in SRF overlying stable PED; partial PVD - discussed possible resistance to IVE -- will check insurance auth for Vabysmo - recommend IVE OD #5 today, 11.07.23 - pt wishes to proceed with injection - RBA of procedure discussed, questions answered - IVE informed consent obtained and signed, 07.06.23 (OD) - see procedure note  - f/u 4 weeks, DFE, OCT, possible injection IVE vs IVV  2. Age related macular degeneration, non-exudative, left eye  - mild/intermediate stage with mild drusen  - The incidence, anatomy, and pathology of dry AMD, risk of progression, and the AREDS and AREDS 2 study including smoking risks discussed with patient.   - Recommend amsler grid monitoring  3. Epiretinal membrane, left eye  - mild ERM OS w/ early pucker, SN macula - BCVA 20/40 - asymptomatic, no metamorphopsia - no indication for surgery at this time - monitor for now  4,5. Hypertensive retinopathy OU - discussed importance of tight BP control - monitor   6. Mixed Cataract OU - The symptoms of cataract, surgical options, and treatments and risks were discussed  with patient.  - discussed diagnosis and progression - under the expert management of Dr. Lucianne Lei  Ophthalmic Meds Ordered this visit:  No orders of the defined types were placed in this encounter.    No follow-ups on file.  There are no Patient Instructions on file for this visit.   Explained the diagnoses, plan, and follow up with the patient and they expressed understanding.  Patient expressed understanding of the importance of proper follow up care.   This document serves as a record of services personally performed by Gardiner Sleeper, MD, PhD. It was created on their behalf by Renaldo Reel, [place] an ophthalmic technician. The creation of this record is the provider's dictation and/or activities during the visit.    Electronically signed by:  Renaldo Reel, COT  10.24.23 9:26 AM    Karie Chimera, M.D., Ph.D. Diseases & Surgery of the Retina and Vitreous [facility]    Abbreviations: M myopia (nearsighted); A astigmatism; H hyperopia (farsighted); P presbyopia; Mrx spectacle prescription;  CTL contact lenses; OD right eye; OS left eye; OU both eyes  XT exotropia; ET esotropia; PEK punctate epithelial keratitis; PEE punctate epithelial erosions; DES dry eye syndrome; MGD meibomian gland dysfunction; ATs artificial tears; PFAT's preservative free artificial tears; NSC nuclear sclerotic cataract; PSC posterior subcapsular cataract; ERM epi-retinal membrane; PVD posterior vitreous detachment; RD retinal detachment; DM diabetes mellitus; DR diabetic retinopathy; NPDR non-proliferative diabetic retinopathy; PDR proliferative diabetic retinopathy; CSME clinically significant macular edema; DME diabetic macular edema; dbh dot blot hemorrhages; CWS cotton wool spot; POAG primary open angle glaucoma; C/D cup-to-disc ratio; HVF humphrey visual field; GVF goldmann visual field; OCT optical coherence tomography; IOP intraocular pressure; BRVO Branch retinal vein  occlusion; CRVO central retinal vein occlusion; CRAO central retinal artery occlusion; BRAO branch retinal artery occlusion; RT retinal tear; SB scleral buckle; PPV pars plana vitrectomy; VH Vitreous hemorrhage; PRP panretinal laser photocoagulation; IVK intravitreal kenalog; VMT vitreomacular traction; MH Macular hole;  NVD neovascularization of the disc; NVE neovascularization elsewhere; AREDS age related eye disease study; ARMD age related macular degeneration; POAG primary open angle glaucoma; EBMD epithelial/anterior basement membrane dystrophy; ACIOL anterior chamber intraocular lens; IOL intraocular lens; PCIOL posterior chamber intraocular lens; Phaco/IOL phacoemulsification with intraocular lens placement; PRK photorefractive keratectomy; LASIK laser assisted in situ keratomileusis; HTN hypertension; DM diabetes mellitus; COPD chronic obstructive pulmonary disease

## 2022-02-10 NOTE — Progress Notes (Signed)
[facility] Note  02/20/2022    CHIEF COMPLAINT Patient presents for Retina Follow Up  HISTORY OF PRESENT ILLNESS: Kimberly Noble is a 76 y.o. female who presents to the clinic today for:   HPI     Retina Follow Up   Patient presents with  Wet AMD.  In right eye.  This started 5 weeks ago.  Duration of 5 weeks.  I, the attending physician,  performed the HPI with the patient and updated documentation appropriately.        Comments   5 week retina follow up ARMD OD I'VE/IVV pt states vision seems stable and has not noticed any changes she denies flashes or floaters       Last edited by Bernarda Caffey, MD on 02/20/2022 10:32 AM.     Patient   Referring physician: No referring provider defined for this encounter.  HISTORICAL INFORMATION:   Selected notes from the MEDICAL RECORD NUMBER Referred by Dr. Lucianne Lei for PED OD LEE:  Ocular Hx- PMH-    CURRENT MEDICATIONS: No current outpatient medications on file. (Ophthalmic Drugs)   No current facility-administered medications for this visit. (Ophthalmic Drugs)   Current Outpatient Medications (Other)  Medication Sig   ALPRAZolam (XANAX) 1 MG tablet Take 1 mg by mouth 3 (three) times daily. 7am, 1pm, 7pm   Ascorbic Acid (VITAMIN C PO) Take 1 capsule by mouth daily. supplement from [facility]   Biotin w/ Vitamins C & E (HAIR/SKIN/NAILS PO) Take 1 capsule by mouth daily. supplement from [facility]   CALCIUM CARBONATE-VITAMIN D PO Take 1 capsule by mouth daily. supplement from [facility]   Cholecalciferol (VITAMIN D3 PO) Take 1 capsule by mouth daily. supplement from [facility]   Coenzyme Q10 (COQ10 PO) Take 1 capsule by mouth daily.   CRANBERRY PO Take 1 capsule by mouth daily. supplement from [facility]   Multiple Vitamin (MULTIVITAMIN WITH MINERALS) TABS tablet Take 1 tablet by mouth daily. supplement from [facility]   Omega-3 Fatty Acids (FISH OIL PO) Take 1 capsule by mouth daily. supplement from [facility]   OVER THE COUNTER MEDICATION Take 1 capsule by mouth daily. Joint support supplement from [facility]   OVER THE COUNTER MEDICATION Take 1 capsule by mouth daily. Cholesterol care supplement from [facility]   OVER THE COUNTER MEDICATION Take 1 capsule by mouth daily. Circulation and vein support supplement from [facility]   OVER THE COUNTER MEDICATION Take 1 capsule by mouth daily. Green vegetable supplement from [employer]   OVER THE COUNTER MEDICATION Take 1 capsule by mouth daily. Liver and brain supplement from [facility]   OVER THE COUNTER MEDICATION Take 1 capsule by mouth daily. Eye vitamin with lutein - supplement from [facility]   VITAMIN K PO Take 1 capsule by mouth daily. supplement from [facility]   No current facility-administered medications for this visit. 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 Claims she is not allergic to all shellfish.    Penicillins Rash    Has patient had a PCN reaction causing immediate rash, facial/tongue/throat swelling, SOB or lightheadedness with hypotension: Yes Has patient had a PCN reaction causing severe rash involving mucus membranes or skin necrosis: No Has patient had a PCN reaction that required hospitalization No Has patient had a PCN reaction occurring within  the last 10 years: No If all of the above answers are "NO", then may proceed with Cephalosporin use.    PAST MEDICAL HISTORY Past Medical History:  Diagnosis Date   Agoraphobia with panic attacks    Anxiety attack    Major depression    Superficial laceration of hand ~ 03/2015   left thumb   Past Surgical History:  Procedure Laterality Date   TONSILLECTOMY  1950's   FAMILY HISTORY Family History  Problem Relation Age of Onset   Macular degeneration Mother    SOCIAL HISTORY Social History   Tobacco Use   Smoking status: Every Day    Packs/day: 0.33     Years: 40.00    Total pack years: 13.20    Types: Cigarettes   Smokeless tobacco: Never  Vaping Use   Vaping Use: Never used  Substance Use Topics   Alcohol use: No   Drug use: No       OPHTHALMIC EXAM:  Base Eye Exam     Visual Acuity (Snellen - Linear)       Right Left   Dist cc 20/50 -2 20/40 -1   Dist ph cc NI NI    Correction: Glasses         Tonometry (Tonopen, 8:02 AM)       Right Left   Pressure 18 16         Pupils       Dark Light Shape React APD   Right 4 3 Round Sluggish None   Left 4 3 Round Sluggish None         Visual Fields       Left Right    Full Full         Extraocular Movement       Right Left    Full, Ortho Full, Ortho         Neuro/Psych     Oriented x3: Yes   Mood/Affect: Normal         Dilation     Both eyes: 2.5% Phenylephrine @ 8:03 AM           Slit Lamp and Fundus Exam     Slit Lamp Exam       Right Left   Lids/Lashes Dermatochalasis - upper lid, mild MGD Dermatochalasis - upper lid, mild MGD   Conjunctiva/Sclera White and quiet White and quiet   Cornea mild arcus, 2+ inferior Punctate epithelial erosions mild arcus, 2+ inferior Punctate epithelial erosions   Anterior Chamber Deep and quiet Deep and quiet   Iris Round and dilated Round and dilated   Lens 2-3+ Nuclear sclerosis, 2-3+ Cortical cataract 2-3+ Nuclear sclerosis, 2-3+ Cortical cataract   Anterior Vitreous Mild syneresis Mild syneresis, Posterior vitreous detachment, vitreous condensations         Fundus Exam       Right Left   Disc Pink and Sharp, temporal PPA, Compact Pink and Sharp, temporal PPA, Compact   C/D Ratio 0.5 0.5   Macula Blunted foveal reflex, +CNV with pigmented RPE rip and surrounding atrophy, persistent SRF -- slightly improved, no frank heme Flat, Blunted foveal reflex, fine drusen, RPE mottling and clumping, No heme or edema   Vessels attenuated, Tortuous attenuated, Tortuous   Periphery Attached, reticular  degeneration, focal pigment clumping IT, No heme Attached, reticular degeneration, focal pigment clumping IT, No heme           Refraction     Wearing Rx  Sphere Cylinder Axis Add   Right -6.00 +1.75 117 +2.50   Left -5.25 +2.25 085 +2.50           IMAGING AND PROCEDURES  Imaging and Procedures for 02/20/2022  OCT, Retina - OU - Both Eyes       Right Eye Quality was good. Central Foveal Thickness: 462. Progression has improved. Findings include abnormal foveal contour, subretinal hyper-reflective material, intraretinal fluid, pigment epithelial detachment, subretinal fluid, vitreomacular adhesion (stable improvement in IRF; mild interval improvement in PED and overlying shallow SRF; partial PVD).   Left Eye Quality was good. Central Foveal Thickness: 258. Progression has been stable. Findings include normal foveal contour, no IRF, no SRF, retinal drusen , epiretinal membrane, macular pucker (Very mild drusen, mild ERM with early pucker SN mac).   Notes *Images captured and stored on drive  Diagnosis / Impression:  OD: exu ARMD - stable improvement in IRF; mild interval improvement in PED and overlying shallow SRF; partial PVD OS: non-exu ARMD - Very mild drusen, mild ERM with early pucker SN mac  Clinical management:  See below  Abbreviations: NFP - Normal foveal profile. CME - cystoid macular edema. PED - pigment epithelial detachment. IRF - intraretinal fluid. SRF - subretinal fluid. EZ - ellipsoid zone. ERM - epiretinal membrane. ORA - outer retinal atrophy. ORT - outer retinal tubulation. SRHM - subretinal hyper-reflective material. IRHM - intraretinal hyper-reflective material      Intravitreal Injection, Pharmacologic Agent - OD - Right Eye       Time Out 02/20/2022. 8:32 AM. Confirmed correct patient, procedure, site, and patient consented.   Anesthesia Topical anesthesia was used. Anesthetic medications included Lidocaine 2%, Proparacaine 0.5%.    Procedure Preparation included 5% betadine to ocular surface, eyelid speculum. A (32g) needle was used.   Injection: 6 mg faricimab-svoa 6 MG/0.05ML   Route: Intravitreal, Site: Right Eye   NDC: [phone], Lot: [id number], Expiration date: 11/12/2022, Waste: 0 mL   Post-op Post injection exam found visual acuity of at least counting fingers. The patient tolerated the procedure well. There were no complications. The patient received written and verbal post procedure care education. Post injection medications were not given.            ASSESSMENT/PLAN:    ICD-10-CM   1. Exudative age-related macular degeneration of right eye with active choroidal neovascularization (HCC)  H35.3211 OCT, Retina - OU - Both Eyes    Intravitreal Injection, Pharmacologic Agent - OD - Right Eye    faricimab-svoa (VABYSMO) 15m/0.05mL intravitreal injection    2. 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 Epiretinal membrane (ERM) of left eye  H35.372     4. Essential hypertension  I10     5. Hypertensive retinopathy of both eyes  H35.033     6. Combined forms of age-related cataract of both eyes  H25.813      1. 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, right eye  - s/p IVA OD #1 (03.16.23), #2 (04.13.23), #3 (05.11.23), #4 (06.08.23) -- IVA resistance  - s/p IVE OD #1 (07.06.23), #2 (08.03.23), #3 (08.31.23), #4 (10.05.23) -- IVE resistance - pt presented initially on 3.16.23 with 2 wk history of central vision loss and distortion  - initial exam and OCT OD showed large central PED with mild surrounding SRF and mild SRHM overlying - BCVA stable at 20/50 OD - OCT shows OD: stable improvement in IRF; mild interval improvement in PED and overlying shallow  SRF; partial PVD  - discussed possible resistance to IVE -- pt has been approved for  Vabysmo -- will switch medications today - recommend IVV OD #1 today, 11.09.23 - pt wishes to proceed with  injection - RBA of procedure discussed, questions answered - IVE informed consent obtained and signed, 07.06.23 (OD) - IVV informed consent obtained and signed, 11.09.23 (OD) - see procedure note  - f/u 4 weeks, DFE, OCT, possible injection  2. Age related macular degeneration, non-exudative, left eye  - mild/intermediate stage with mild drusen  - The incidence, anatomy, and pathology of dry AMD, risk of progression, and the AREDS and AREDS 2 study including smoking risks discussed with patient.   - Recommend amsler grid monitoring  3. Epiretinal membrane, left eye  - mild ERM OS w/ early pucker, SN macula - BCVA 20/40 - asymptomatic, no metamorphopsia - no indication for surgery at this time - monitor for now  4,5. Hypertensive retinopathy OU - discussed importance of tight BP control - monitor   6. Mixed Cataract OU - The symptoms of cataract, surgical options, and treatments and risks were discussed with patient.  - discussed diagnosis and progression - under the expert management of Dr. Lucianne Lei  Ophthalmic Meds Ordered this visit:  Meds ordered this encounter  Medications   faricimab-svoa (VABYSMO) 70m/0.05mL intravitreal injection     Return in about 4 weeks (around 03/20/2022) for ex ARMD OD, Dilated Exam, OCT, Possible Injxn.  There are no Patient Instructions on file for this visit.   Explained the diagnoses, plan, and follow up with the patient and they expressed understanding.  Patient expressed understanding of the importance of proper follow up care.   This document serves as a record of services personally performed by BGardiner Sleeper MD, PhD. It was created on their behalf by ASan Jetty BOwens Shark OA an ophthalmic technician. The creation of this record is the provider's dictation and/or activities during the visit.    Electronically signed by: ASan Jetty BOwens Shark O[place]10.30.2023 10:34 AM  BGardiner Sleeper M.D., Ph.D. Diseases & Surgery of the Retina and Vitreous [facility]  [place] I have reviewed the above documentation for accuracy and completeness, and I agree with the above. BGardiner Sleeper M.D., Ph.D. 02/20/22 10:34 AM  Abbreviations: M myopia (nearsighted); A astigmatism; H hyperopia (farsighted); P presbyopia; Mrx spectacle prescription;  CTL contact lenses; OD right eye; OS left eye; OU both eyes  XT exotropia; ET esotropia; PEK punctate epithelial keratitis; PEE punctate epithelial erosions; DES dry eye syndrome; MGD meibomian gland dysfunction; ATs artificial tears; PFAT's preservative free artificial tears; [place]; PSC posterior subcapsular cataract; ERM epi-retinal membrane; PVD posterior vitreous detachment; RD retinal detachment; DM diabetes mellitus; DR diabetic retinopathy; NPDR non-proliferative diabetic retinopathy; PDR proliferative diabetic retinopathy; CSME clinically significant macular edema; DME diabetic macular edema; dbh dot blot hemorrhages; CWS cotton wool spot; POAG primary open angle glaucoma; C/D cup-to-disc ratio; HVF humphrey visual field; GVF goldmann visual field; OCT optical coherence tomography; IOP intraocular pressure; BRVO Branch retinal vein occlusion; CRVO central retinal vein occlusion; CRAO central retinal artery occlusion; BRAO branch retinal artery occlusion; RT retinal tear; SB scleral buckle; PPV pars plana vitrectomy; VH Vitreous hemorrhage; PRP panretinal laser photocoagulation; IVK intravitreal kenalog; VMT vitreomacular traction; MH Macular hole;  NVD neovascularization of the disc; NVE neovascularization elsewhere; AREDS age related eye disease study; ARMD age related macular degeneration; POAG primary open angle glaucoma; EBMD epithelial/anterior basement membrane dystrophy; ACIOL anterior  chamber intraocular lens; IOL intraocular lens; PCIOL posterior chamber intraocular lens; Phaco/IOL phacoemulsification with intraocular lens placement; [place] photorefractive keratectomy; LASIK  laser assisted in situ keratomileusis; HTN hypertension; DM diabetes mellitus; COPD chronic obstructive pulmonary disease

## 2022-02-18 ENCOUNTER — Encounter (INDEPENDENT_AMBULATORY_CARE_PROVIDER_SITE_OTHER): Payer: Medicare Other | Admitting: Ophthalmology

## 2022-02-18 DIAGNOSIS — H353211 Exudative age-related macular degeneration, right eye, with active choroidal neovascularization: Secondary | ICD-10-CM

## 2022-02-18 DIAGNOSIS — H353122 Nonexudative age-related macular degeneration, left eye, intermediate dry stage: Secondary | ICD-10-CM

## 2022-02-18 DIAGNOSIS — I10 Essential (primary) hypertension: Secondary | ICD-10-CM

## 2022-02-18 DIAGNOSIS — H25813 Combined forms of age-related cataract, bilateral: Secondary | ICD-10-CM

## 2022-02-18 DIAGNOSIS — H35372 Puckering of macula, left eye: Secondary | ICD-10-CM

## 2022-02-18 DIAGNOSIS — H35033 Hypertensive retinopathy, bilateral: Secondary | ICD-10-CM

## 2022-02-20 ENCOUNTER — Encounter (INDEPENDENT_AMBULATORY_CARE_PROVIDER_SITE_OTHER): Payer: Self-pay | Admitting: Ophthalmology

## 2022-02-20 ENCOUNTER — Ambulatory Visit (INDEPENDENT_AMBULATORY_CARE_PROVIDER_SITE_OTHER): Payer: Medicare Other | Admitting: Ophthalmology

## 2022-02-20 DIAGNOSIS — I10 Essential (primary) hypertension: Secondary | ICD-10-CM

## 2022-02-20 DIAGNOSIS — H35372 Puckering of macula, left eye: Secondary | ICD-10-CM

## 2022-02-20 DIAGNOSIS — H353122 Nonexudative age-related macular degeneration, left eye, intermediate dry stage: Secondary | ICD-10-CM | POA: Diagnosis not present

## 2022-02-20 DIAGNOSIS — H25813 Combined forms of age-related cataract, bilateral: Secondary | ICD-10-CM

## 2022-02-20 DIAGNOSIS — H353211 Exudative age-related macular degeneration, right eye, with active choroidal neovascularization: Secondary | ICD-10-CM | POA: Diagnosis not present

## 2022-02-20 DIAGNOSIS — H35033 Hypertensive retinopathy, bilateral: Secondary | ICD-10-CM

## 2022-02-20 MED ORDER — FARICIMAB-SVOA 6 MG/0.05ML IZ SOLN
6.0000 mg | INTRAVITREAL | Status: AC | PRN
  Administered 2022-02-20: 6 mg via INTRAVITREAL

## 2022-03-13 NOTE — Progress Notes (Signed)
[facility] Note  03/20/2022    CHIEF COMPLAINT Patient presents for Retina Follow Up  HISTORY OF PRESENT ILLNESS: Kimberly Noble is a 76 y.o. female who presents to the clinic today for:   HPI     Retina Follow Up   Patient presents with  Wet AMD.  In right eye.  This started 4 weeks ago.  Duration of 4 weeks.  I, the attending physician,  performed the HPI with the patient and updated documentation appropriately.        Comments   4 week retina follow up ARMD OD and IVV OD pt states no vision changes noticed she denies flashes or floaters pt reports 5 days after having injection she lost her hearing in her right ear       Last edited by Bernarda Caffey, MD on 03/20/2022 10:32 AM.    Patient states distortion is getting better  Referring physician: No referring provider defined for this encounter.  HISTORICAL INFORMATION:   Selected notes from the MEDICAL RECORD NUMBER Referred by Dr. Lucianne Lei for PED OD LEE:  Ocular Hx- PMH-    CURRENT MEDICATIONS: No current outpatient medications on file. (Ophthalmic Drugs)   No current facility-administered medications for this visit. (Ophthalmic Drugs)   Current Outpatient Medications (Other)  Medication Sig   ALPRAZolam (XANAX) 1 MG tablet Take 1 mg by mouth 3 (three) times daily. 7am, 1pm, 7pm   Ascorbic Acid (VITAMIN C PO) Take 1 capsule by mouth daily. supplement from [facility]   Biotin w/ Vitamins C & E (HAIR/SKIN/NAILS PO) Take 1 capsule by mouth daily. supplement from [facility]   CALCIUM CARBONATE-VITAMIN D PO Take 1 capsule by mouth daily. supplement from [facility]   Cholecalciferol (VITAMIN D3 PO) Take 1 capsule by mouth daily. supplement from [facility]   Coenzyme Q10 (COQ10 PO) Take 1 capsule by mouth daily.   CRANBERRY PO Take 1 capsule by mouth daily. supplement from [facility]   Multiple Vitamin (MULTIVITAMIN WITH MINERALS) TABS tablet Take 1 tablet by mouth daily. supplement from [facility]    Omega-3 Fatty Acids (FISH OIL PO) Take 1 capsule by mouth daily. supplement from [facility]   OVER THE COUNTER MEDICATION Take 1 capsule by mouth daily. Joint support supplement from [facility]   OVER THE COUNTER MEDICATION Take 1 capsule by mouth daily. Cholesterol care supplement from [facility]   OVER THE COUNTER MEDICATION Take 1 capsule by mouth daily. Circulation and vein support supplement from [facility]   OVER THE COUNTER MEDICATION Take 1 capsule by mouth daily. Green vegetable supplement from [employer]   OVER THE COUNTER MEDICATION Take 1 capsule by mouth daily. Liver and brain supplement from [facility]   OVER THE COUNTER MEDICATION Take 1 capsule by mouth daily. Eye vitamin with lutein - supplement from [facility]   VITAMIN K PO Take 1 capsule by mouth daily. supplement from [facility]   No current facility-administered medications for this visit. 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 Claims she is not allergic to all shellfish.    Penicillins Rash    Has patient had a PCN reaction causing immediate rash, facial/tongue/throat swelling, SOB or lightheadedness with hypotension: Yes Has patient had a PCN reaction causing severe rash involving mucus membranes or skin necrosis: No Has patient had  a PCN reaction that required hospitalization No Has patient had a PCN reaction occurring within the last 10 years: No If all of the above answers are "NO", then may proceed with Cephalosporin use.    PAST MEDICAL HISTORY Past Medical History:  Diagnosis Date   Agoraphobia with panic attacks    Anxiety attack    Major depression    Superficial laceration of hand ~ 03/2015   left thumb   Past Surgical History:  Procedure Laterality Date   TONSILLECTOMY  1950's   FAMILY HISTORY Family History  Problem Relation Age of Onset   Macular degeneration Mother    SOCIAL  HISTORY Social History   Tobacco Use   Smoking status: Every Day    Packs/day: 0.33    Years: 40.00    Total pack years: 13.20    Types: Cigarettes   Smokeless tobacco: Never  Vaping Use   Vaping Use: Never used  Substance Use Topics   Alcohol use: No   Drug use: No       OPHTHALMIC EXAM:  Base Eye Exam     Visual Acuity (Snellen - Linear)       Right Left   Dist cc 20/50 -3 20/40 -2   Dist ph cc NI NI    Correction: Glasses         Tonometry (Tonopen, 9:33 AM)       Right Left   Pressure 16 14         Pupils       Pupils Dark Light Shape React APD   Right PERRL 4 3 Round Sluggish None   Left PERRL 4 3 Round Sluggish None         Visual Fields       Left Right    Full Full         Extraocular Movement       Right Left    Full, Ortho Full, Ortho         Neuro/Psych     Oriented x3: Yes   Mood/Affect: Normal         Dilation     Both eyes: 2.5% Phenylephrine @ 9:33 AM           Slit Lamp and Fundus Exam     Slit Lamp Exam       Right Left   Lids/Lashes Dermatochalasis - upper lid, mild MGD Dermatochalasis - upper lid, mild MGD   Conjunctiva/Sclera White and quiet White and quiet   Cornea mild arcus, 2+ inferior Punctate epithelial erosions mild arcus, 2+ inferior Punctate epithelial erosions   Anterior Chamber Deep and quiet Deep and quiet   Iris Round and dilated Round and dilated   Lens 2-3+ Nuclear sclerosis, 2-3+ Cortical cataract 2-3+ Nuclear sclerosis, 2-3+ Cortical cataract   Anterior Vitreous Mild syneresis Mild syneresis, Posterior vitreous detachment, vitreous condensations         Fundus Exam       Right Left   Disc Pink and Sharp, temporal PPA, Compact Pink and Sharp, temporal PPA, Compact   C/D Ratio 0.5 0.5   Macula Blunted foveal reflex, +CNV with pigmented RPE rip and surrounding atrophy, persistent SRF -- improved, no frank heme Flat, Blunted foveal reflex, fine drusen, RPE mottling and clumping, No  heme or edema   Vessels attenuated, mild tortuosity attenuated, Tortuous   Periphery Attached, reticular degeneration, focal pigment clumping IT, No heme Attached, reticular degeneration, focal pigment clumping IT, No heme  Refraction     Wearing Rx       Sphere Cylinder Axis Add   Right -6.00 +1.75 117 +2.50   Left -5.25 +2.25 085 +2.50           IMAGING AND PROCEDURES  Imaging and Procedures for 03/20/2022  OCT, Retina - OU - Both Eyes       Right Eye Quality was good. Central Foveal Thickness: 430. Progression has improved. Findings include abnormal foveal contour, subretinal hyper-reflective material, intraretinal fluid, pigment epithelial detachment, subretinal fluid, vitreomacular adhesion (stable improvement in IRF; mild interval improvement in PED and overlying shallow SRF; partial PVD).   Left Eye Quality was good. Central Foveal Thickness: 259. Progression has been stable. Findings include normal foveal contour, no IRF, no SRF, retinal drusen , epiretinal membrane, macular pucker (Very mild drusen, mild ERM with early pucker SN mac).   Notes *Images captured and stored on drive  Diagnosis / Impression:  OD: exu ARMD - stable improvement in IRF; mild interval improvement in PED and overlying shallow SRF; partial PVD OS: non-exu ARMD - Very mild drusen, mild ERM with early pucker SN mac  Clinical management:  See below  Abbreviations: NFP - Normal foveal profile. CME - cystoid macular edema. PED - pigment epithelial detachment. IRF - intraretinal fluid. SRF - subretinal fluid. EZ - ellipsoid zone. ERM - epiretinal membrane. ORA - outer retinal atrophy. ORT - outer retinal tubulation. SRHM - subretinal hyper-reflective material. IRHM - intraretinal hyper-reflective material      Intravitreal Injection, Pharmacologic Agent - OD - Right Eye       Time Out 03/20/2022. 10:08 AM. Confirmed correct patient, procedure, site, and patient consented.    Anesthesia Topical anesthesia was used. Anesthetic medications included Lidocaine 2%, Proparacaine 0.5%.   Procedure Preparation included 5% betadine to ocular surface, eyelid speculum. A (32g) needle was used.   Injection: 6 mg faricimab-svoa 6 MG/0.05ML   Route: Intravitreal, Site: Right Eye   NDC: [id number], Lot: [id number], Expiration date: 04/13/2024, Waste: 0 mL   Post-op Post injection exam found visual acuity of at least counting fingers. The patient tolerated the procedure well. There were no complications. The patient received written and verbal post procedure care education. Post injection medications were not given.             ASSESSMENT/PLAN:    ICD-10-CM   1. Exudative age-related macular degeneration of right eye with active choroidal neovascularization (HCC)  H35.3211 OCT, Retina - OU - Both Eyes    Intravitreal Injection, Pharmacologic Agent - OD - Right Eye    faricimab-svoa (VABYSMO) 13m/0.05mL intravitreal injection    2. 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 Epiretinal membrane (ERM) of left eye  H35.372     4. Essential hypertension  I10     5. Hypertensive retinopathy of both eyes  H35.033     6. Combined forms of age-related cataract of both eyes  H25.813      1. 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, right eye  - s/p IVA OD #1 (03.16.23), #2 (04.13.23), #3 (05.11.23), #4 (06.08.23) -- IVA resistance  - s/p IVE OD #1 (07.06.23), #2 (08.03.23), #3 (08.31.23), #4 (10.05.23) -- IVE resistance  - s/p IVV OD #1 (11.09.23) - pt presented initially on 3.16.23 with 2 wk history of central vision loss and distortion  - initial exam and OCT OD showed large central PED with mild surrounding SRF and mild SRHM overlying -  BCVA stable at 20/50 OD - OCT shows OD: stable improvement in IRF; mild interval improvement in PED and overlying shallow SRF; partial PVD  - recommend IVV OD #2 today,  12.07.23 - pt wishes to proceed with injection - RBA of procedure discussed, questions answered - IVE informed consent obtained and signed, 07.06.23 (OD) - IVV informed consent obtained and signed, 11.09.23 (OD) - see procedure note  - f/u 4 weeks, DFE, OCT, possible injection  2. Age related macular degeneration, non-exudative, left eye  - mild/intermediate stage with mild drusen  - The incidence, anatomy, and pathology of dry AMD, risk of progression, and the AREDS and AREDS 2 study including smoking risks discussed with patient.   - Recommend amsler grid monitoring  3. Epiretinal membrane, left eye  - mild ERM OS w/ early pucker, SN macula - BCVA 20/40 - asymptomatic, no metamorphopsia - no indication for surgery at this time - monitor for now  4,5. Hypertensive retinopathy OU - discussed importance of tight BP control - monitor   6. Mixed Cataract OU - The symptoms of cataract, surgical options, and treatments and risks were discussed with patient.  - discussed diagnosis and progression - under the expert management of Dr. Lucianne Lei  Ophthalmic Meds Ordered this visit:  Meds ordered this encounter  Medications   faricimab-svoa (VABYSMO) 14m/0.05mL intravitreal injection     Return in about 4 weeks (around 04/17/2022) for f/u exu ARMD OD, DFE, OCT.  There are no Patient Instructions on file for this visit.   Explained the diagnoses, plan, and follow up with the patient and they expressed understanding.  Patient expressed understanding of the importance of proper follow up care.   This document serves as a record of services personally performed by BGardiner Sleeper MD, PhD. It was created on their behalf by ASan Jetty BOwens Shark OA an ophthalmic technician. The creation of this record is the provider's dictation and/or activities during the visit.    Electronically signed by: ASan Jetty BOwens Shark O[place]11.30.2023 10:33 AM   BGardiner Sleeper M.D., Ph.D. Diseases & Surgery of the Retina  and Vitreous [facility] I have reviewed the above documentation for accuracy and completeness, and I agree with the above. BGardiner Sleeper M.D., Ph.D. 03/20/22 10:34 AM   Abbreviations: M myopia (nearsighted); A astigmatism; H hyperopia (farsighted); P presbyopia; Mrx spectacle prescription;  CTL contact lenses; OD right eye; OS left eye; OU both eyes  XT exotropia; ET esotropia; PEK punctate epithelial keratitis; PEE punctate epithelial erosions; DES dry eye syndrome; MGD meibomian gland dysfunction; ATs artificial tears; PFAT's preservative free artificial tears; [place] cataract; PSC posterior subcapsular cataract; ERM epi-retinal membrane; PVD posterior vitreous detachment; RD retinal detachment; DM diabetes mellitus; DR diabetic retinopathy; NPDR non-proliferative diabetic retinopathy; PDR proliferative diabetic retinopathy; CSME clinically significant macular edema; DME diabetic macular edema; dbh dot blot hemorrhages; CWS cotton wool spot; POAG primary open angle glaucoma; C/D cup-to-disc ratio; HVF humphrey visual field; GVF goldmann visual field; OCT optical coherence tomography; IOP intraocular pressure; BRVO Branch retinal vein occlusion; CRVO central retinal vein occlusion; CRAO central retinal artery occlusion; BRAO branch retinal artery occlusion; RT retinal tear; SB scleral buckle; PPV pars plana vitrectomy; VH Vitreous hemorrhage; PRP panretinal laser photocoagulation; IVK intravitreal kenalog; VMT vitreomacular traction; MH Macular hole;  NVD neovascularization of the disc; NVE neovascularization elsewhere; AREDS age related eye disease study; ARMD age related macular degeneration; POAG primary open angle glaucoma; EBMD epithelial/anterior basement membrane dystrophy;  ACIOL anterior chamber intraocular lens; IOL intraocular lens; PCIOL posterior chamber intraocular lens; Phaco/IOL phacoemulsification with intraocular lens placement; [place] photorefractive  keratectomy; LASIK laser assisted in situ keratomileusis; HTN hypertension; DM diabetes mellitus; COPD chronic obstructive pulmonary disease

## 2022-03-20 ENCOUNTER — Encounter (INDEPENDENT_AMBULATORY_CARE_PROVIDER_SITE_OTHER): Payer: Self-pay | Admitting: Ophthalmology

## 2022-03-20 ENCOUNTER — Ambulatory Visit (INDEPENDENT_AMBULATORY_CARE_PROVIDER_SITE_OTHER): Payer: Medicare Other | Admitting: Ophthalmology

## 2022-03-20 DIAGNOSIS — I10 Essential (primary) hypertension: Secondary | ICD-10-CM | POA: Diagnosis not present

## 2022-03-20 DIAGNOSIS — H35372 Puckering of macula, left eye: Secondary | ICD-10-CM | POA: Diagnosis not present

## 2022-03-20 DIAGNOSIS — H353211 Exudative age-related macular degeneration, right eye, with active choroidal neovascularization: Secondary | ICD-10-CM

## 2022-03-20 DIAGNOSIS — H25813 Combined forms of age-related cataract, bilateral: Secondary | ICD-10-CM

## 2022-03-20 DIAGNOSIS — H353122 Nonexudative age-related macular degeneration, left eye, intermediate dry stage: Secondary | ICD-10-CM

## 2022-03-20 DIAGNOSIS — H35033 Hypertensive retinopathy, bilateral: Secondary | ICD-10-CM

## 2022-03-20 MED ORDER — FARICIMAB-SVOA 6 MG/0.05ML IZ SOLN
6.0000 mg | INTRAVITREAL | Status: AC | PRN
  Administered 2022-03-20: 6 mg via INTRAVITREAL

## 2022-04-15 NOTE — Progress Notes (Signed)
[facility] Note  04/17/2022    CHIEF COMPLAINT Patient presents for Retina Follow Up  HISTORY OF PRESENT ILLNESS: Kimberly Noble is a 77 y.o. female who presents to the clinic today for:   HPI     Retina Follow Up   Patient presents with  Wet AMD.  In right eye.  Severity is moderate.  Duration of 4 weeks.  Since onset it is stable.  I, the attending physician,  performed the HPI with the patient and updated documentation appropriately.        Comments   Pt here for 4 wk ret f/u exu ARMD OD. Pt states VA is stable. She states since she started Vabysmo she feels people on TV seems a bit clearer.       Last edited by Bernarda Caffey, MD on 04/17/2022  1:12 PM.     Patient states vision is getting better, distortion is also getting better  Referring physician: No referring provider defined for this encounter.  HISTORICAL INFORMATION:   Selected notes from the MEDICAL RECORD NUMBER Referred by Dr. Lucianne Lei for PED OD LEE:  Ocular Hx- PMH-    CURRENT MEDICATIONS: No current outpatient medications on file. (Ophthalmic Drugs)   No current facility-administered medications for this visit. (Ophthalmic Drugs)   Current Outpatient Medications (Other)  Medication Sig   ALPRAZolam (XANAX) 1 MG tablet Take 1 mg by mouth 3 (three) times daily. 7am, 1pm, 7pm   Ascorbic Acid (VITAMIN C PO) Take 1 capsule by mouth daily. supplement from [facility]   Biotin w/ Vitamins C & E (HAIR/SKIN/NAILS PO) Take 1 capsule by mouth daily. supplement from [facility]   CALCIUM CARBONATE-VITAMIN D PO Take 1 capsule by mouth daily. supplement from [facility]   Cholecalciferol (VITAMIN D3 PO) Take 1 capsule by mouth daily. supplement from [facility]   Coenzyme Q10 (COQ10 PO) Take 1 capsule by mouth daily.   CRANBERRY PO Take 1 capsule by mouth daily. supplement from [facility]   Multiple Vitamin (MULTIVITAMIN WITH MINERALS) TABS tablet Take 1 tablet by mouth daily. supplement from [facility]   Omega-3 Fatty Acids (FISH OIL PO) Take 1 capsule by mouth daily. supplement from [facility]   OVER THE COUNTER MEDICATION Take 1 capsule by mouth daily. Joint support supplement from [facility]   OVER THE COUNTER MEDICATION Take 1 capsule by mouth daily. Cholesterol care supplement from [facility]   OVER THE COUNTER MEDICATION Take 1 capsule by mouth daily. Circulation and vein support supplement from [facility]   OVER THE COUNTER MEDICATION Take 1 capsule by mouth daily. Green vegetable supplement from [employer]   OVER THE COUNTER MEDICATION Take 1 capsule by mouth daily. Liver and brain supplement from [facility]   OVER THE COUNTER MEDICATION Take 1 capsule by mouth daily. Eye vitamin with lutein - supplement from [facility]   VITAMIN K PO Take 1 capsule by mouth daily. supplement from [facility]   No current facility-administered medications for this visit. (Other)   REVIEW OF SYSTEMS: ROS   Positive for: Neurological, Eyes Negative for: Constitutional, Gastrointestinal, Skin, Genitourinary, Musculoskeletal, HENT, Endocrine, Cardiovascular, Respiratory, Psychiatric, Allergic/Imm, Heme/Lymph Last edited by Kingsley Spittle, COT on 04/17/2022  9:09 AM.     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 Claims she is not allergic to all shellfish.    Penicillins  Rash    Has patient had a PCN reaction causing immediate rash, facial/tongue/throat swelling, SOB or lightheadedness with hypotension: Yes Has patient had a PCN reaction causing severe rash involving mucus membranes or skin necrosis: No Has patient had a PCN reaction that required hospitalization No Has patient had a PCN reaction occurring within the last 10 years: No If all of the above answers are "NO", then may proceed with Cephalosporin use.    PAST MEDICAL HISTORY Past Medical History:  Diagnosis Date   Agoraphobia with panic attacks    Anxiety  attack    Major depression    Superficial laceration of hand ~ 03/2015   left thumb   Past Surgical History:  Procedure Laterality Date   TONSILLECTOMY  1950's   FAMILY HISTORY Family History  Problem Relation Age of Onset   Macular degeneration Mother    SOCIAL HISTORY Social History   Tobacco Use   Smoking status: Every Day    Packs/day: 0.33    Years: 40.00    Total pack years: 13.20    Types: Cigarettes   Smokeless tobacco: Never  Vaping Use   Vaping Use: Never used  Substance Use Topics   Alcohol use: No   Drug use: No       OPHTHALMIC EXAM:  Base Eye Exam     Visual Acuity (Snellen - Linear)       Right Left   Dist cc 20/50 20/40 -2   Dist ph cc 20/40 20/30 -1    Correction: Glasses         Tonometry (Tonopen, 9:14 AM)       Right Left   Pressure 19 21         Pupils       Pupils Dark Light Shape React APD   Right PERRL 4 3 Round Sluggish None   Left PERRL 4 3 Round Sluggish None         Visual Fields (Counting fingers)       Left Right    Full Full         Extraocular Movement       Right Left    Full, Ortho Full, Ortho         Neuro/Psych     Oriented x3: Yes   Mood/Affect: Normal         Dilation     Both eyes: 1.0% Mydriacyl, 2.5% Phenylephrine @ 9:15 AM           Slit Lamp and Fundus Exam     Slit Lamp Exam       Right Left   Lids/Lashes Dermatochalasis - upper lid, mild MGD Dermatochalasis - upper lid, mild MGD   Conjunctiva/Sclera White and quiet White and quiet   Cornea mild arcus, 2+ inferior Punctate epithelial erosions mild arcus, 2+ inferior Punctate epithelial erosions   Anterior Chamber Deep and quiet Deep and quiet   Iris Round and dilated Round and dilated   Lens 2-3+ Nuclear sclerosis, 2-3+ Cortical cataract 2-3+ Nuclear sclerosis, 2-3+ Cortical cataract   Anterior Vitreous Mild syneresis Mild syneresis, Posterior vitreous detachment, vitreous condensations         Fundus Exam        Right Left   Disc Pink and Sharp, temporal PPA, Compact Pink and Sharp, temporal PPA, Compact   C/D Ratio 0.5 0.5   Macula Blunted foveal reflex, +CNV with pigmented RPE rip and surrounding atrophy, persistent shallow SRF, no frank heme Flat, Blunted foveal  reflex, fine drusen, RPE mottling and clumping, No heme or edema   Vessels attenuated, Tortuous attenuated, Tortuous   Periphery Attached, reticular degeneration, focal pigment clumping IT, No heme Attached, reticular degeneration, focal pigment clumping IT, No heme           Refraction     Wearing Rx       Sphere Cylinder Axis Add   Right -6.00 +1.75 117 +2.50   Left -5.25 +2.25 085 +2.50           IMAGING AND PROCEDURES  Imaging and Procedures for 04/17/2022  OCT, Retina - OU - Both Eyes       Right Eye Quality was good. Central Foveal Thickness: 433. Progression has been stable. Findings include no IRF, abnormal foveal contour, subretinal hyper-reflective material, pigment epithelial detachment, subretinal fluid, vitreomacular adhesion (stable improvement in IRF, PED and overlying shallow SRF; partial PVD).   Left Eye Quality was good. Central Foveal Thickness: 258. Progression has been stable. Findings include normal foveal contour, no IRF, no SRF, retinal drusen , epiretinal membrane, macular pucker (Very mild drusen, mild ERM with early pucker SN mac).   Notes *Images captured and stored on drive  Diagnosis / Impression:  OD: exu ARMD - stable improvement in IRF, PED and overlying shallow SRF; partial PVD OS: non-exu ARMD - Very mild drusen, mild ERM with early pucker SN mac  Clinical management:  See below  Abbreviations: NFP - Normal foveal profile. CME - cystoid macular edema. PED - pigment epithelial detachment. IRF - intraretinal fluid. SRF - subretinal fluid. EZ - ellipsoid zone. ERM - epiretinal membrane. ORA - outer retinal atrophy. ORT - outer retinal tubulation. SRHM - subretinal hyper-reflective  material. IRHM - intraretinal hyper-reflective material      Intravitreal Injection, Pharmacologic Agent - OD - Right Eye       Time Out 04/17/2022. 9:50 AM. Confirmed correct patient, procedure, site, and patient consented.   Anesthesia Topical anesthesia was used. Anesthetic medications included Lidocaine 2%, Proparacaine 0.5%.   Procedure Preparation included 5% betadine to ocular surface, eyelid speculum. A (32g) needle was used.   Injection: 6 mg faricimab-svoa 6 MG/0.05ML   Route: Intravitreal, Site: Right Eye   NDC: [phone], Lot: [id number], Expiration date: 04/12/2024, Waste: 0 mL   Post-op Post injection exam found visual acuity of at least counting fingers. The patient tolerated the procedure well. There were no complications. The patient received written and verbal post procedure care education. Post injection medications were not given.            ASSESSMENT/PLAN:    ICD-10-CM   1. Exudative age-related macular degeneration of right eye with active choroidal neovascularization (HCC)  H35.3211 OCT, Retina - OU - Both Eyes    Intravitreal Injection, Pharmacologic Agent - OD - Right Eye    faricimab-svoa (VABYSMO) 73m/0.05mL intravitreal injection    2. 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 Epiretinal membrane (ERM) of left eye  H35.372     4. Essential hypertension  I10     5. Hypertensive retinopathy of both eyes  H35.033     6. Combined forms of age-related cataract of both eyes  H25.813      1. 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, right eye  - s/p IVA OD #1 (03.16.23), #2 (04.13.23), #3 (05.11.23), #4 (06.08.23) -- IVA resistance  - s/p IVE OD #1 (07.06.23), #2 (08.03.23), #3 (08.31.23), #4 (10.05.23) -- IVE resistance  -  s/p IVV OD #1 (11.09.23), #2 (12.07.23) - pt presented initially on 3.16.23 with 2 wk history of central vision loss and distortion  - initial exam and OCT OD showed  large central PED with mild surrounding SRF and mild SRHM overlying - BCVA 20/40 from 20/50 OD - OCT shows OD: stable improvement in IRF; mild interval improvement in PED and overlying shallow SRF; partial PVD  - recommend IVV OD #3 today, 01.04.23 - pt wishes to proceed with injection - RBA of procedure discussed, questions answered - IVE informed consent obtained and signed, 07.06.23 (OD) - IVV informed consent obtained and signed, 11.09.23 (OD) - see procedure note  - f/u 4 weeks, DFE, OCT, possible injection  2. Age related macular degeneration, non-exudative, left eye  - mild/intermediate stage with mild drusen  - The incidence, anatomy, and pathology of dry AMD, risk of progression, and the AREDS and AREDS 2 study including smoking risks discussed with patient.   - Recommend amsler grid monitoring  3. Epiretinal membrane, left eye  - mild ERM OS w/ early pucker, SN macula - BCVA 20/40 - asymptomatic, no metamorphopsia - no indication for surgery at this time - monitor for now  4,5. Hypertensive retinopathy OU - discussed importance of tight BP control - monitor   6. Mixed Cataract OU - The symptoms of cataract, surgical options, and treatments and risks were discussed with patient.  - discussed diagnosis and progression - under the expert management of Dr. Lucianne Lei  Ophthalmic Meds Ordered this visit:  Meds ordered this encounter  Medications   faricimab-svoa (VABYSMO) 59m/0.05mL intravitreal injection     Return in about 4 weeks (around 05/15/2022) for f/u exu ARMD OD, DFE, OCT.  There are no Patient Instructions on file for this visit.   Explained the diagnoses, plan, and follow up with the patient and they expressed understanding.  Patient expressed understanding of the importance of proper follow up care.   This document serves as a record of services personally performed by BGardiner Sleeper MD, PhD. It was created on their behalf by ASan Jetty BOwens Shark OA an ophthalmic  technician. The creation of this record is the provider's dictation and/or activities during the visit.    Electronically signed by: ASan Jetty BOwens [place]ark O[place]01.02.2024 1:13 PM  BGardiner Sleeper M.D., Ph.D. Diseases & Surgery of the Retina and Vitreous [facility] I have reviewed the above documentation for accuracy and completeness, and I agree with the above. BGardiner Sleeper M.D., Ph.D. 04/17/22 1:23 PM  Abbreviations: M myopia (nearsighted); A astigmatism; H hyperopia (farsighted); P presbyopia; Mrx spectacle prescription;  CTL contact lenses; OD right eye; OS left eye; OU both eyes  XT exotropia; ET esotropia; PEK punctate epithelial keratitis; PEE punctate epithelial erosions; DES dry eye syndrome; MGD meibomian gland dysfunction; ATs artificial tears; PFAT's preservative free artificial tears; NFredonianuclear sclerotic cataract; PSC posterior subcapsular cataract; ERM epi-retinal membrane; PVD posterior vitreous detachment; RD retinal detachment; DM diabetes mellitus; DR diabetic retinopathy; NPDR non-proliferative diabetic retinopathy; PDR proliferative diabetic retinopathy; CSME clinically significant macular edema; DME diabetic macular edema; dbh dot blot hemorrhages; CWS cotton wool spot; POAG primary open angle glaucoma; C/D cup-to-disc ratio; HVF humphrey visual field; GVF goldmann visual field; OCT optical coherence tomography; IOP intraocular pressure; BRVO Branch retinal vein occlusion; CRVO central retinal vein occlusion; CRAO central retinal artery occlusion; BRAO branch retinal artery occlusion; RT retinal tear; SB scleral buckle; PPV pars plana vitrectomy; VH Vitreous hemorrhage; PRP panretinal  laser photocoagulation; IVK intravitreal kenalog; VMT vitreomacular traction; MH Macular hole;  NVD neovascularization of the disc; NVE neovascularization elsewhere; AREDS age related eye disease study; ARMD age related macular degeneration; POAG primary open angle glaucoma; EBMD  epithelial/anterior basement membrane dystrophy; ACIOL anterior chamber intraocular lens; IOL intraocular lens; PCIOL posterior chamber intraocular lens; Phaco/IOL phacoemulsification with intraocular lens placement; [place] photorefractive keratectomy; LASIK laser assisted in situ keratomileusis; HTN hypertension; DM diabetes mellitus; COPD chronic obstructive pulmonary disease

## 2022-04-17 ENCOUNTER — Encounter (INDEPENDENT_AMBULATORY_CARE_PROVIDER_SITE_OTHER): Payer: Self-pay | Admitting: Ophthalmology

## 2022-04-17 ENCOUNTER — Ambulatory Visit (INDEPENDENT_AMBULATORY_CARE_PROVIDER_SITE_OTHER): Payer: Medicare Other | Admitting: Ophthalmology

## 2022-04-17 DIAGNOSIS — I10 Essential (primary) hypertension: Secondary | ICD-10-CM

## 2022-04-17 DIAGNOSIS — H25813 Combined forms of age-related cataract, bilateral: Secondary | ICD-10-CM

## 2022-04-17 DIAGNOSIS — H35372 Puckering of macula, left eye: Secondary | ICD-10-CM | POA: Diagnosis not present

## 2022-04-17 DIAGNOSIS — H35033 Hypertensive retinopathy, bilateral: Secondary | ICD-10-CM

## 2022-04-17 DIAGNOSIS — H353211 Exudative age-related macular degeneration, right eye, with active choroidal neovascularization: Secondary | ICD-10-CM

## 2022-04-17 DIAGNOSIS — H353122 Nonexudative age-related macular degeneration, left eye, intermediate dry stage: Secondary | ICD-10-CM

## 2022-04-17 MED ORDER — FARICIMAB-SVOA 6 MG/0.05ML IZ SOLN
6.0000 mg | INTRAVITREAL | Status: AC | PRN
  Administered 2022-04-17: 6 mg via INTRAVITREAL

## 2022-05-06 NOTE — Progress Notes (Signed)
[facility] Note  05/15/2022    CHIEF COMPLAINT Patient presents for Retina Follow Up  HISTORY OF PRESENT ILLNESS: Kimberly Noble is a 77 y.o. female who presents to the clinic today for:   HPI     Retina Follow Up   Patient presents with  Wet AMD.  In right eye.  This started 4 weeks ago.  Duration of 4 weeks.  Since onset it is stable.  I, the attending physician,  performed the HPI with the patient and updated documentation appropriately.        Comments   4 week retina follow up ARMD IVV OD pt states her vision seems stable she denies any flashes or floaters       Last edited by Bernarda Caffey, MD on 05/15/2022 11:45 AM.     Patient feels like vision is worse today  Referring physician: No referring provider defined for this encounter.  HISTORICAL INFORMATION:   Selected notes from the MEDICAL RECORD NUMBER Referred by Dr. Lucianne Lei for PED OD LEE:  Ocular Hx- PMH-    CURRENT MEDICATIONS: No current outpatient medications on file. (Ophthalmic Drugs)   No current facility-administered medications for this visit. (Ophthalmic Drugs)   Current Outpatient Medications (Other)  Medication Sig   ALPRAZolam (XANAX) 1 MG tablet Take 1 mg by mouth 3 (three) times daily. 7am, 1pm, 7pm   Ascorbic Acid (VITAMIN C PO) Take 1 capsule by mouth daily. supplement from [facility]   Biotin w/ Vitamins C & E (HAIR/SKIN/NAILS PO) Take 1 capsule by mouth daily. supplement from [facility]   CALCIUM CARBONATE-VITAMIN D PO Take 1 capsule by mouth daily. supplement from [facility]   Cholecalciferol (VITAMIN D3 PO) Take 1 capsule by mouth daily. supplement from [facility]   Coenzyme Q10 (COQ10 PO) Take 1 capsule by mouth daily.   CRANBERRY PO Take 1 capsule by mouth daily. supplement from [facility]   Multiple Vitamin (MULTIVITAMIN WITH MINERALS) TABS tablet Take 1 tablet by mouth daily. supplement from [facility]   Omega-3 Fatty Acids (FISH OIL PO) Take 1 capsule by mouth  daily. supplement from [facility]   OVER THE COUNTER MEDICATION Take 1 capsule by mouth daily. Joint support supplement from [facility]   OVER THE COUNTER MEDICATION Take 1 capsule by mouth daily. Cholesterol care supplement from [facility]   OVER THE COUNTER MEDICATION Take 1 capsule by mouth daily. Circulation and vein support supplement from [facility]   OVER THE COUNTER MEDICATION Take 1 capsule by mouth daily. Green vegetable supplement from [employer]   OVER THE COUNTER MEDICATION Take 1 capsule by mouth daily. Liver and brain supplement from [facility]   OVER THE COUNTER MEDICATION Take 1 capsule by mouth daily. Eye vitamin with lutein - supplement from [facility]   VITAMIN K PO Take 1 capsule by mouth daily. supplement from [facility]   No current facility-administered medications for this visit. (Other)   REVIEW OF SYSTEMS: ROS   Positive for: Neurological, Eyes Negative for: Constitutional, Gastrointestinal, Skin, Genitourinary, Musculoskeletal, HENT, Endocrine, Cardiovascular, Respiratory, Psychiatric, Allergic/Imm, Heme/Lymph Last edited by Parthenia Ames, COT on 05/15/2022  9:06 AM.     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 Claims she is not allergic to all shellfish.    Penicillins Rash    Has patient had a PCN reaction causing immediate rash,  facial/tongue/throat swelling, SOB or lightheadedness with hypotension: Yes Has patient had a PCN reaction causing severe rash involving mucus membranes or skin necrosis: No Has patient had a PCN reaction that required hospitalization No Has patient had a PCN reaction occurring within the last 10 years: No If all of the above answers are "NO", then may proceed with Cephalosporin use.    PAST MEDICAL HISTORY Past Medical History:  Diagnosis Date   Agoraphobia with panic attacks    Anxiety attack    Major depression    Superficial laceration of hand ~  03/2015   left thumb   Past Surgical History:  Procedure Laterality Date   TONSILLECTOMY  1950's   FAMILY HISTORY Family History  Problem Relation Age of Onset   Macular degeneration Mother    SOCIAL HISTORY Social History   Tobacco Use   Smoking status: Every Day    Packs/day: 0.33    Years: 40.00    Total pack years: 13.20    Types: Cigarettes   Smokeless tobacco: Never  Vaping Use   Vaping Use: Never used  Substance Use Topics   Alcohol use: No   Drug use: No       OPHTHALMIC EXAM:  Base Eye Exam     Visual Acuity (Snellen - Linear)       Right Left   Dist cc 20/50 20/50   Dist ph cc 20/40 -2 20/40 +1    Correction: Glasses         Tonometry (Tonopen, 9:10 AM)       Right Left   Pressure 16 18         Pupils       Pupils Dark Light Shape React APD   Right PERRL 4 3 Round Sluggish None   Left PERRL 4 3 Round Sluggish None         Visual Fields       Left Right    Full Full         Extraocular Movement       Right Left    Full, Ortho Full, Ortho         Neuro/Psych     Oriented x3: Yes   Mood/Affect: Normal         Dilation     Both eyes: 2.5% Phenylephrine @ 9:10 AM           Slit Lamp and Fundus Exam     Slit Lamp Exam       Right Left   Lids/Lashes Dermatochalasis - upper lid, mild MGD Dermatochalasis - upper lid, mild MGD   Conjunctiva/Sclera White and quiet White and quiet   Cornea mild arcus, 1+ inferior Punctate epithelial erosions, trace tear film debris mild arcus, 1+ inferior Punctate epithelial erosions, tear film debris   Anterior Chamber Deep and quiet Deep and quiet   Iris Round and dilated Round and dilated   Lens 2-3+ Nuclear sclerosis with brunescence, 2-3+ Cortical cataract 2-3+ Nuclear sclerosis, 2-3+ Cortical cataract   Anterior Vitreous Mild syneresis Mild syneresis, Posterior vitreous detachment, vitreous condensations         Fundus Exam       Right Left   Disc Pink and Sharp,  temporal PPA, Compact Pink and Sharp, temporal PPA, Compact   C/D Ratio 0.5 0.5   Macula Blunted foveal reflex, +CNV with pigmented RPE rip and surrounding atrophy, persistent shallow SRF - improved, no frank heme Flat, Blunted foveal reflex, fine drusen, RPE mottling and  clumping, No heme or edema   Vessels attenuated, Tortuous attenuated, Tortuous   Periphery Attached, reticular degeneration, focal pigment clumping IT, No heme Attached, reticular degeneration, focal pigment clumping IT, No heme           Refraction     Wearing Rx       Sphere Cylinder Axis Add   Right -6.00 +1.75 117 +2.50   Left -5.25 +2.25 085 +2.50           IMAGING AND PROCEDURES  Imaging and Procedures for 05/15/2022  OCT, Retina - OU - Both Eyes       Right Eye Quality was good. Central Foveal Thickness: 436. Progression has been stable. Findings include no IRF, abnormal foveal contour, subretinal hyper-reflective material, pigment epithelial detachment, subretinal fluid, vitreomacular adhesion (stable improvement in IRF, PED and overlying shallow SRF; trace slivers of SRF remain, partial PVD).   Left Eye Quality was good. Central Foveal Thickness: 261. Progression has been stable. Findings include normal foveal contour, no IRF, no SRF, retinal drusen , epiretinal membrane, macular pucker (Very mild drusen, mild ERM with early pucker SN mac).   Notes *Images captured and stored on drive  Diagnosis / Impression:  OD: exu ARMD - stable improvement in IRF, PED and overlying shallow SRF; trace slivers of SRF remain, partial PVD OS: non-exu ARMD - Very mild drusen, mild ERM with early pucker SN mac  Clinical management:  See below  Abbreviations: NFP - Normal foveal profile. CME - cystoid macular edema. PED - pigment epithelial detachment. IRF - intraretinal fluid. SRF - subretinal fluid. EZ - ellipsoid zone. ERM - epiretinal membrane. ORA - outer retinal atrophy. ORT - outer retinal tubulation. SRHM -  subretinal hyper-reflective material. IRHM - intraretinal hyper-reflective material      Intravitreal Injection, Pharmacologic Agent - OD - Right Eye       Time Out 05/15/2022. 9:54 AM. Confirmed correct patient, procedure, site, and patient consented.   Anesthesia Topical anesthesia was used. Anesthetic medications included Lidocaine 2%, Proparacaine 0.5%.   Procedure Preparation included 5% betadine to ocular surface, eyelid speculum. A (32g) needle was used.   Injection: 6 mg faricimab-svoa 6 MG/0.05ML   Route: Intravitreal, Site: Right Eye   NDC: [phone], Lot: [id number], Expiration date: 05/14/2024, Waste: 0 mL   Post-op Post injection exam found visual acuity of at least counting fingers. The patient tolerated the procedure well. There were no complications. The patient received written and verbal post procedure care education. Post injection medications were not given.            ASSESSMENT/PLAN:    ICD-10-CM   1. Exudative age-related macular degeneration of right eye with active choroidal neovascularization (HCC)  H35.3211 OCT, Retina - OU - Both Eyes    Intravitreal Injection, Pharmacologic Agent - OD - Right Eye    faricimab-svoa (VABYSMO) 77m/0.05mL intravitreal injection    2. 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 Epiretinal membrane (ERM) of left eye  H35.372     4. Essential hypertension  I10     5. Hypertensive retinopathy of both eyes  H35.033     6. Combined forms of age-related cataract of both eyes  H25.813      1. 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, right eye  - s/p IVA OD #1 (03.16.23), #2 (04.13.23), #3 (05.11.23), #4 (06.08.23) -- IVA resistance  - s/p IVE OD #1 (07.06.23), #2 (08.03.23), #3 (08.31.23), #4 (10.05.23) --  IVE resistance  - s/p IVV OD #1 (11.09.23), #2 (12.07.23), #3 (01.04.24) - pt presented initially on 3.16.23 with 2 wk history of central vision loss and  distortion  - initial exam and OCT OD showed large central PED with mild surrounding SRF and mild SRHM overlying - BCVA stable at 20/40  - OCT shows OD: stable improvement in IRF, PED and overlying shallow SRF; trace slivers of SRF remain, partial PVD - recommend IVV OD #4 today, 02.01.24 - pt wishes to proceed with injection - RBA of procedure discussed, questions answered - IVE informed consent obtained and signed, 07.06.23 (OD) - IVV informed consent obtained and signed, 11.09.23 (OD) - see procedure note  - f/u 4 weeks, DFE, OCT, possible injection  2. Age related macular degeneration, non-exudative, left eye  - mild/intermediate stage with mild drusen  - The incidence, anatomy, and pathology of dry AMD, risk of progression, and the AREDS and AREDS 2 study including smoking risks discussed with patient.   - Recommend amsler grid monitoring  3. Epiretinal membrane, left eye  - mild ERM OS w/ early pucker, SN macula - BCVA 20/40 - asymptomatic, no metamorphopsia - no indication for surgery at this time - monitor for now  4,5. Hypertensive retinopathy OU - discussed importance of tight BP control - monitor   6. Mixed Cataract OU - The symptoms of cataract, surgical options, and treatments and risks were discussed with patient.  - discussed diagnosis and progression - will refer to Dr. Lucita Ferrara for consult  Ophthalmic Meds Ordered this visit:  Meds ordered this encounter  Medications   faricimab-svoa (VABYSMO) 32m/0.05mL intravitreal injection     Return in about 4 weeks (around 06/12/2022) for f/u exu ARMD OD, DFE, OCT.  There are no Patient Instructions on file for this visit.   Explained the diagnoses, plan, and follow up with the patient and they expressed understanding.  Patient expressed understanding of the importance of proper follow up care.   This document serves as a record of services personally performed by BGardiner Sleeper MD, PhD. It was created on their  behalf by ASan Jetty BOwens Shark OA an ophthalmic technician. The creation of this record is the provider's dictation and/or activities during the visit.    Electronically signed by: ASan Jetty BOwens Shark [place]01.23.2024 11:46 AM  BGardiner Sleeper M.D., Ph.D. Diseases & Surgery of the Retina and Vitreous [facility] I have reviewed the above documentation for accuracy and completeness, and I agree with the above. BGardiner Sleeper M.D., Ph.D. 05/15/22 11:47 AM   Abbreviations: M myopia (nearsighted); A astigmatism; H hyperopia (farsighted); P presbyopia; Mrx spectacle prescription;  CTL contact lenses; OD right eye; OS left eye; OU both eyes  XT exotropia; ET esotropia; PEK punctate epithelial keratitis; PEE punctate epithelial erosions; DES dry eye syndrome; MGD meibomian gland dysfunction; ATs artificial tears; PFAT's preservative free artificial tears; NOtsegonuclear sclerotic cataract; PSC posterior subcapsular cataract; ERM epi-retinal membrane; PVD posterior vitreous detachment; RD retinal detachment; DM diabetes mellitus; DR diabetic retinopathy; NPDR non-proliferative diabetic retinopathy; PDR proliferative diabetic retinopathy; CSME clinically significant macular edema; DME diabetic macular edema; dbh dot blot hemorrhages; CWS cotton wool spot; POAG primary open angle glaucoma; C/D cup-to-disc ratio; HVF humphrey visual field; GVF goldmann visual field; OCT optical coherence tomography; IOP intraocular pressure; BRVO Branch retinal vein occlusion; CRVO central retinal vein occlusion; CRAO central retinal artery occlusion; BRAO branch retinal artery occlusion; RT retinal tear; SB scleral buckle; PPV pars  plana vitrectomy; VH Vitreous hemorrhage; PRP panretinal laser photocoagulation; IVK intravitreal kenalog; VMT vitreomacular traction; MH Macular hole;  NVD neovascularization of the disc; NVE neovascularization elsewhere; AREDS age related eye disease study; ARMD age related macular  degeneration; POAG primary open angle glaucoma; EBMD epithelial/anterior basement membrane dystrophy; ACIOL anterior chamber intraocular lens; IOL intraocular lens; PCIOL posterior chamber intraocular lens; Phaco/IOL phacoemulsification with intraocular lens placement; [place] photorefractive keratectomy; LASIK laser assisted in situ keratomileusis; HTN hypertension; DM diabetes mellitus; COPD chronic obstructive pulmonary disease

## 2022-05-15 ENCOUNTER — Ambulatory Visit (INDEPENDENT_AMBULATORY_CARE_PROVIDER_SITE_OTHER): Payer: Medicare Other | Admitting: Ophthalmology

## 2022-05-15 ENCOUNTER — Encounter (INDEPENDENT_AMBULATORY_CARE_PROVIDER_SITE_OTHER): Payer: Self-pay | Admitting: Ophthalmology

## 2022-05-15 DIAGNOSIS — H25813 Combined forms of age-related cataract, bilateral: Secondary | ICD-10-CM

## 2022-05-15 DIAGNOSIS — I10 Essential (primary) hypertension: Secondary | ICD-10-CM | POA: Diagnosis not present

## 2022-05-15 DIAGNOSIS — H353211 Exudative age-related macular degeneration, right eye, with active choroidal neovascularization: Secondary | ICD-10-CM | POA: Diagnosis not present

## 2022-05-15 DIAGNOSIS — H353122 Nonexudative age-related macular degeneration, left eye, intermediate dry stage: Secondary | ICD-10-CM | POA: Diagnosis not present

## 2022-05-15 DIAGNOSIS — H35033 Hypertensive retinopathy, bilateral: Secondary | ICD-10-CM

## 2022-05-15 DIAGNOSIS — H35372 Puckering of macula, left eye: Secondary | ICD-10-CM

## 2022-05-15 MED ORDER — FARICIMAB-SVOA 6 MG/0.05ML IZ SOLN
6.0000 mg | INTRAVITREAL | Status: AC | PRN
  Administered 2022-05-15: 6 mg via INTRAVITREAL

## 2022-05-29 NOTE — Progress Notes (Signed)
[facility] Note  06/12/2022    CHIEF COMPLAINT Patient presents for Retina Follow Up  HISTORY OF PRESENT ILLNESS: Kimberly Noble is a 77 y.o. female who presents to the clinic today for:   HPI     Retina Follow Up   Patient presents with  Wet AMD.  In right eye.  This started 4 weeks ago.  Duration of 4 weeks.  Since onset it is stable.  I, the attending physician,  performed the HPI with the patient and updated documentation appropriately.        Comments   4 week retina follow up ARMD OD and IVV OD pt is reporting vision seems stable since her last visit she denies any flashes or floaters       Last edited by Bernarda Caffey, MD on 06/12/2022 12:16 PM.    Patient feels like vision is the same, she is using AT's about twice a day, pt has an appt with Dr. Lucita Ferrara in April  Referring physician: No referring provider defined for this encounter.  HISTORICAL INFORMATION:   Selected notes from the MEDICAL RECORD NUMBER Referred by Dr. Lucianne Lei for PED OD LEE:  Ocular Hx- PMH-    CURRENT MEDICATIONS: No current outpatient medications on file. (Ophthalmic Drugs)   No current facility-administered medications for this visit. (Ophthalmic Drugs)   Current Outpatient Medications (Other)  Medication Sig   ALPRAZolam (XANAX) 1 MG tablet Take 1 mg by mouth 3 (three) times daily. 7am, 1pm, 7pm   Ascorbic Acid (VITAMIN C PO) Take 1 capsule by mouth daily. supplement from [facility]   Biotin w/ Vitamins C & E (HAIR/SKIN/NAILS PO) Take 1 capsule by mouth daily. supplement from [facility]   CALCIUM CARBONATE-VITAMIN D PO Take 1 capsule by mouth daily. supplement from [facility]   Cholecalciferol (VITAMIN D3 PO) Take 1 capsule by mouth daily. supplement from [facility]   Coenzyme Q10 (COQ10 PO) Take 1 capsule by mouth daily.   CRANBERRY PO Take 1 capsule by mouth daily. supplement from [facility]   Multiple Vitamin (MULTIVITAMIN WITH MINERALS) TABS tablet Take 1  tablet by mouth daily. supplement from [facility]   Omega-3 Fatty Acids (FISH OIL PO) Take 1 capsule by mouth daily. supplement from [facility]   OVER THE COUNTER MEDICATION Take 1 capsule by mouth daily. Joint support supplement from [facility]   OVER THE COUNTER MEDICATION Take 1 capsule by mouth daily. Cholesterol care supplement from [facility]   OVER THE COUNTER MEDICATION Take 1 capsule by mouth daily. Circulation and vein support supplement from [facility]   OVER THE COUNTER MEDICATION Take 1 capsule by mouth daily. Green vegetable supplement from [employer]   OVER THE COUNTER MEDICATION Take 1 capsule by mouth daily. Liver and brain supplement from [facility]   OVER THE COUNTER MEDICATION Take 1 capsule by mouth daily. Eye vitamin with lutein - supplement from [facility]   VITAMIN K PO Take 1 capsule by mouth daily. supplement from [facility]   No current facility-administered medications for this visit. (Other)   REVIEW OF SYSTEMS: ROS   Positive for: Neurological, Eyes Negative for: Constitutional, Gastrointestinal, Skin, Genitourinary, Musculoskeletal, HENT, Endocrine, Cardiovascular, Respiratory, Psychiatric, Allergic/Imm, Heme/Lymph Last edited by Parthenia Ames, COT on 06/12/2022  8:40 AM.     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 Claims she is  not allergic to all shellfish.    Penicillins Rash    Has patient had a PCN reaction causing immediate rash, facial/tongue/throat swelling, SOB or lightheadedness with hypotension: Yes Has patient had a PCN reaction causing severe rash involving mucus membranes or skin necrosis: No Has patient had a PCN reaction that required hospitalization No Has patient had a PCN reaction occurring within the last 10 years: No If all of the above answers are "NO", then may proceed with Cephalosporin use.    PAST MEDICAL HISTORY Past Medical History:  Diagnosis Date    Agoraphobia with panic attacks    Anxiety attack    Major depression    Superficial laceration of hand ~ 03/2015   left thumb   Past Surgical History:  Procedure Laterality Date   TONSILLECTOMY  1950's   FAMILY HISTORY Family History  Problem Relation Age of Onset   Macular degeneration Mother    SOCIAL HISTORY Social History   Tobacco Use   Smoking status: Every Day    Packs/day: 0.33    Years: 40.00    Total pack years: 13.20    Types: Cigarettes   Smokeless tobacco: Never  Vaping Use   Vaping Use: Never used  Substance Use Topics   Alcohol use: No   Drug use: No       OPHTHALMIC EXAM:  Base Eye Exam     Visual Acuity (Snellen - Linear)       Right Left   Dist cc 20/60 20/40   Dist ph cc 20/50 20/30 -1         Tonometry (Tonopen, 8:46 AM)       Right Left   Pressure 14 17         Pupils       Pupils Dark Light Shape React APD   Right PERRL 4 3 Round Sluggish None   Left PERRL 4 3 Round Sluggish None         Visual Fields       Left Right    Full Full         Extraocular Movement       Right Left    Full, Ortho Full, Ortho         Neuro/Psych     Oriented x3: Yes   Mood/Affect: Normal         Dilation     Both eyes: 2.5% Phenylephrine @ 8:46 AM           Slit Lamp and Fundus Exam     Slit Lamp Exam       Right Left   Lids/Lashes Dermatochalasis - upper lid, mild MGD Dermatochalasis - upper lid, mild MGD   Conjunctiva/Sclera White and quiet White and quiet   Cornea mild arcus, 1+ inferior Punctate epithelial erosions, trace tear film debris mild arcus, 1+ inferior Punctate epithelial erosions, trace tear film debris   Anterior Chamber Deep and quiet Deep and quiet   Iris Round and dilated Round and dilated   Lens 3+ Nuclear sclerosis with brunescence, 3+ Cortical cataract 3+ Nuclear sclerosis, 3+ Cortical cataract   Anterior Vitreous Mild syneresis Mild syneresis, Posterior vitreous detachment, vitreous  condensations         Fundus Exam       Right Left   Disc Pink and Sharp, temporal PPA, Compact Pink and Sharp, temporal PPA, Compact   C/D Ratio 0.5 0.5   Macula Blunted foveal reflex, +CNV with pigmented RPE rip and surrounding atrophy,  persistent shallow SRF, no frank heme Flat, Blunted foveal reflex, fine drusen, RPE mottling and clumping, No heme or edema   Vessels attenuated, Tortuous attenuated, Tortuous   Periphery Attached, reticular degeneration, focal pigment clumping IT, No heme Attached, reticular degeneration, focal pigment clumping IT, No heme           Refraction     Wearing Rx       Sphere Cylinder Axis Add   Right -6.00 +1.75 117 +2.50   Left -5.25 +2.25 085 +2.50           IMAGING AND PROCEDURES  Imaging and Procedures for 06/12/2022  OCT, Retina - OU - Both Eyes       Right Eye Quality was good. Central Foveal Thickness: 434. Progression has been stable. Findings include no IRF, abnormal foveal contour, subretinal hyper-reflective material, pigment epithelial detachment, subretinal fluid, vitreomacular adhesion (stable improvement in IRF, PED and overlying shallow SRF; trace slivers of SRF remain inferiorly, partial PVD).   Left Eye Quality was good. Central Foveal Thickness: 260. Progression has been stable. Findings include normal foveal contour, no IRF, no SRF, retinal drusen , epiretinal membrane, macular pucker (Very mild drusen, mild ERM with early pucker SN mac).   Notes *Images captured and stored on drive  Diagnosis / Impression:  OD: exu ARMD - stable improvement in IRF, PED and overlying shallow SRF; trace slivers of SRF remain inferiorly, partial PVD OS: non-exu ARMD - Very mild drusen, mild ERM with early pucker SN mac  Clinical management:  See below  Abbreviations: NFP - Normal foveal profile. CME - cystoid macular edema. PED - pigment epithelial detachment. IRF - intraretinal fluid. SRF - subretinal fluid. EZ - ellipsoid zone.  ERM - epiretinal membrane. ORA - outer retinal atrophy. ORT - outer retinal tubulation. SRHM - subretinal hyper-reflective material. IRHM - intraretinal hyper-reflective material      Intravitreal Injection, Pharmacologic Agent - OD - Right Eye       Time Out 06/12/2022. 9:11 AM. Confirmed correct patient, procedure, site, and patient consented.   Anesthesia Topical anesthesia was used. Anesthetic medications included Lidocaine 2%, Proparacaine 0.5%.   Procedure Preparation included 5% betadine to ocular surface, eyelid speculum. A (32g) needle was used.   Injection: 6 mg faricimab-svoa 6 MG/0.05ML   Route: Intravitreal, Site: Right Eye   NDC: [id number], Lot: [id number], Expiration date: 05/14/2024, Waste: 0 mL   Post-op Post injection exam found visual acuity of at least counting fingers. The patient tolerated the procedure well. There were no complications. The patient received written and verbal post procedure care education. Post injection medications were not given.            ASSESSMENT/PLAN:    ICD-10-CM   1. Exudative age-related macular degeneration of right eye with active choroidal neovascularization (HCC)  H35.3211 OCT, Retina - OU - Both Eyes    Intravitreal Injection, Pharmacologic Agent - OD - Right Eye    faricimab-svoa (VABYSMO) '6mg'$ /0.62m intravitreal injection    2. 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 Epiretinal membrane (ERM) of left eye  H35.372     4. Essential hypertension  I10     5. Hypertensive retinopathy of both eyes  H35.033     6. Combined forms of age-related cataract of both eyes  H25.813      1. 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, right eye  - s/p IVA OD #1 (03.16.23), #2 (04.13.23), #3 (05.11.23), #4 (06.08.23) --  IVA resistance  - s/p IVE OD #1 (07.06.23), #2 (08.03.23), #3 (08.31.23), #4 (10.05.23) -- IVE resistance  - s/p IVV OD #1 (11.09.23), #2 (12.07.23), #3  (01.04.24), #4 (02.01.24) - pt presented initially on 3.16.23 with 2 wk history of central vision loss and distortion  - initial exam and OCT OD showed large central PED with mild surrounding SRF and mild SRHM overlying - BCVA OD 20/50 from 20/40 - OCT shows OD: stable improvement in IRF, PED and overlying shallow SRF; trace sliver of SRF inferiorly, partial PVD at 4 wks - recommend IVV OD #5 today, 02.29.24 w/ f/u in 4 wks - pt wishes to proceed with injection - RBA of procedure discussed, questions answered - IVE informed consent obtained and signed, 07.06.23 (OD) - IVV informed consent obtained and signed, 11.09.23 (OD) - see procedure note  - f/u 4 weeks, DFE, OCT, possible injection  2. Age related macular degeneration, non-exudative, left eye  - mild/intermediate stage with mild drusen  - The incidence, anatomy, and pathology of dry AMD, risk of progression, and the AREDS and AREDS 2 study including smoking risks discussed with patient.   - Recommend amsler grid monitoring  3. Epiretinal membrane, left eye  - mild ERM OS w/ early pucker, SN macula - BCVA 20/40 - asymptomatic, no metamorphopsia - no indication for surgery at this time - monitor for now  4,5. Hypertensive retinopathy OU - discussed importance of tight BP control - monitor   6. Mixed Cataract OU - The symptoms of cataract, surgical options, and treatments and risks were discussed with patient.  - discussed diagnosis and progression - referred to Dr. Lucita Ferrara for consult -- appt in April  [place] Ordered this visit:  Meds ordered this encounter  Medications   faricimab-svoa (VABYSMO) '6mg'$ /0.13m intravitreal injection     Return in about 4 weeks (around 07/10/2022) for f/u exu ARMD OD, DFE, OCT.  There are no Patient Instructions on file for this visit.   Explained the diagnoses, plan, and follow up with the patient and they expressed understanding.  Patient expressed understanding of the  importance of proper follow up care.   This document serves as a record of services personally performed by BGardiner Sleeper MD, PhD. It was created on their behalf by ASan Jetty BOwens Shark OA an ophthalmic technician. The creation of this record is the provider's dictation and/or activities during the visit.    Electronically signed by: ASan Jetty BOwens Shark [place]02.15.2024 12:16 PM  BGardiner Sleeper M.D., Ph.D. Diseases & Surgery of the Retina and Vitreous [facility] I have reviewed the above documentation for accuracy and completeness, and I agree with the above. BGardiner Sleeper M.D., Ph.D. 06/12/22 12:19 PM  Abbreviations: M myopia (nearsighted); A astigmatism; H hyperopia (farsighted); P presbyopia; Mrx spectacle prescription;  CTL contact lenses; OD right eye; OS left eye; OU both eyes  XT exotropia; ET esotropia; PEK punctate epithelial keratitis; PEE punctate epithelial erosions; DES dry eye syndrome; MGD meibomian gland dysfunction; ATs artificial tears; PFAT's preservative free artificial tears; [place] cataract; PSC posterior subcapsular cataract; ERM epi-retinal membrane; PVD posterior vitreous detachment; RD retinal detachment; DM diabetes mellitus; DR diabetic retinopathy; NPDR non-proliferative diabetic retinopathy; PDR proliferative diabetic retinopathy; CSME clinically significant macular edema; DME diabetic macular edema; dbh dot blot hemorrhages; CWS cotton wool spot; POAG primary open angle glaucoma; C/D cup-to-disc ratio; HVF humphrey visual field; GVF goldmann visual field; OCT optical coherence tomography; IOP intraocular pressure;  BRVO Branch retinal vein occlusion; CRVO central retinal vein occlusion; CRAO central retinal artery occlusion; BRAO branch retinal artery occlusion; RT retinal tear; SB scleral buckle; PPV pars plana vitrectomy; VH Vitreous hemorrhage; PRP panretinal laser photocoagulation; IVK intravitreal kenalog; VMT vitreomacular  traction; MH Macular hole;  NVD neovascularization of the disc; NVE neovascularization elsewhere; AREDS age related eye disease study; ARMD age related macular degeneration; POAG primary open angle glaucoma; EBMD epithelial/anterior basement membrane dystrophy; ACIOL anterior chamber intraocular lens; IOL intraocular lens; PCIOL posterior chamber intraocular lens; Phaco/IOL phacoemulsification with intraocular lens placement; [place] photorefractive keratectomy; LASIK laser assisted in situ keratomileusis; HTN hypertension; DM diabetes mellitus; COPD chronic obstructive pulmonary disease

## 2022-06-12 ENCOUNTER — Encounter (INDEPENDENT_AMBULATORY_CARE_PROVIDER_SITE_OTHER): Payer: Self-pay | Admitting: Ophthalmology

## 2022-06-12 ENCOUNTER — Ambulatory Visit (INDEPENDENT_AMBULATORY_CARE_PROVIDER_SITE_OTHER): Payer: Medicare Other | Admitting: Ophthalmology

## 2022-06-12 DIAGNOSIS — H353211 Exudative age-related macular degeneration, right eye, with active choroidal neovascularization: Secondary | ICD-10-CM | POA: Diagnosis not present

## 2022-06-12 DIAGNOSIS — H25813 Combined forms of age-related cataract, bilateral: Secondary | ICD-10-CM

## 2022-06-12 DIAGNOSIS — H35372 Puckering of macula, left eye: Secondary | ICD-10-CM

## 2022-06-12 DIAGNOSIS — I10 Essential (primary) hypertension: Secondary | ICD-10-CM | POA: Diagnosis not present

## 2022-06-12 DIAGNOSIS — H353122 Nonexudative age-related macular degeneration, left eye, intermediate dry stage: Secondary | ICD-10-CM | POA: Diagnosis not present

## 2022-06-12 DIAGNOSIS — H35033 Hypertensive retinopathy, bilateral: Secondary | ICD-10-CM

## 2022-06-12 MED ORDER — FARICIMAB-SVOA 6 MG/0.05ML IZ SOLN
6.0000 mg | INTRAVITREAL | Status: AC | PRN
  Administered 2022-06-12: 6 mg via INTRAVITREAL

## 2022-06-26 NOTE — Progress Notes (Signed)
[facility] Note  07/10/2022    CHIEF COMPLAINT Patient presents for Retina Follow Up  HISTORY OF PRESENT ILLNESS: Kimberly Noble is a 77 y.o. female who presents to the clinic today for:   HPI     Retina Follow Up   Patient presents with  Wet AMD.  In both eyes.  This started 4.  Duration of 4 weeks.  Since onset it is stable.  I, the attending physician,  performed the HPI with the patient and updated documentation appropriately.        Comments   4 week retina follow up ARMD OD and IVV OD pt is reporting no vision changes noticed she denies any flashes or floaters       Last edited by Bernarda Caffey, MD on 07/10/2022 12:10 PM.     Referring physician: No referring provider defined for this encounter.  HISTORICAL INFORMATION:   Selected notes from the MEDICAL RECORD NUMBER Referred by Dr. Lucianne Lei for PED OD LEE:  Ocular Hx- PMH-    CURRENT MEDICATIONS: No current outpatient medications on file. (Ophthalmic Drugs)   No current facility-administered medications for this visit. (Ophthalmic Drugs)   Current Outpatient Medications (Other)  Medication Sig   ALPRAZolam (XANAX) 1 MG tablet Take 1 mg by mouth 3 (three) times daily. 7am, 1pm, 7pm   Ascorbic Acid (VITAMIN C PO) Take 1 capsule by mouth daily. supplement from [facility]   Biotin w/ Vitamins C & E (HAIR/SKIN/NAILS PO) Take 1 capsule by mouth daily. supplement from [facility]   CALCIUM CARBONATE-VITAMIN D PO Take 1 capsule by mouth daily. supplement from [facility]   Cholecalciferol (VITAMIN D3 PO) Take 1 capsule by mouth daily. supplement from [facility]   Coenzyme Q10 (COQ10 PO) Take 1 capsule by mouth daily.   CRANBERRY PO Take 1 capsule by mouth daily. supplement from [facility]   Multiple Vitamin (MULTIVITAMIN WITH MINERALS) TABS tablet Take 1 tablet by mouth daily. supplement from [facility]   Omega-3 Fatty Acids (FISH OIL PO) Take 1 capsule by mouth daily. supplement from [facility]   OVER  THE COUNTER MEDICATION Take 1 capsule by mouth daily. Joint support supplement from [facility]   OVER THE COUNTER MEDICATION Take 1 capsule by mouth daily. Cholesterol care supplement from [facility]   OVER THE COUNTER MEDICATION Take 1 capsule by mouth daily. Circulation and vein support supplement from [facility]   OVER THE COUNTER MEDICATION Take 1 capsule by mouth daily. Green vegetable supplement from [employer]   OVER THE COUNTER MEDICATION Take 1 capsule by mouth daily. Liver and brain supplement from [facility]   OVER THE COUNTER MEDICATION Take 1 capsule by mouth daily. Eye vitamin with lutein - supplement from [facility]   VITAMIN K PO Take 1 capsule by mouth daily. supplement from [facility]   No current facility-administered medications for this visit. (Other)   REVIEW OF SYSTEMS: ROS   Positive for: Neurological, Eyes Negative for: Constitutional, Gastrointestinal, Skin, Genitourinary, Musculoskeletal, HENT, Endocrine, Cardiovascular, Respiratory, Psychiatric, Allergic/Imm, Heme/Lymph Last edited by Parthenia Ames, COT on 07/10/2022  9:01 AM.     ALLERGIES Allergies  Allergen Reactions   Crab Extract Anaphylaxis    Eating crab causes severe allergy. Claims she is not allergic to all shellfish.    Penicillins Rash    Has patient had a PCN reaction causing immediate rash, facial/tongue/throat swelling, SOB or lightheadedness with hypotension: Yes Has patient  had a PCN reaction causing severe rash involving mucus membranes or skin necrosis: No Has patient had a PCN reaction that required hospitalization No Has patient had a PCN reaction occurring within the last 10 years: No If all of the above answers are "NO", then may proceed with Cephalosporin use.    PAST MEDICAL HISTORY Past Medical History:  Diagnosis Date   Agoraphobia with panic attacks    Anxiety attack    Major depression    Superficial laceration of hand ~ 03/2015   left thumb   Past Surgical History:  Procedure  Laterality Date   TONSILLECTOMY  1950's   FAMILY HISTORY Family History  Problem Relation Age of Onset   Macular degeneration Mother    SOCIAL HISTORY Social History   Tobacco Use   Smoking status: Every Day    Packs/day: 0.33    Years: 40.00    Additional pack years: 0.00    Total pack years: 13.20    Types: Cigarettes   Smokeless tobacco: Never  Vaping Use   Vaping Use: Never used  Substance Use Topics   Alcohol use: No   Drug use: No       OPHTHALMIC EXAM:  Base Eye Exam     Visual Acuity (Snellen - Linear)       Right Left   Dist cc 20/50 20/50   Dist ph cc 20/40 -2 20/40    Correction: Glasses         Tonometry (Tonopen, 9:04 AM)       Right Left   Pressure 15 17         Pupils       Pupils Dark Light Shape React APD   Right PERRL 4 3 Round Brisk None   Left PERRL 4 3 Round Brisk None         Visual Fields       Left Right    Full Full         Extraocular Movement       Right Left    Full, Ortho Full, Ortho         Neuro/Psych     Oriented x3: Yes   Mood/Affect: Normal         Dilation     Both eyes:            Slit Lamp and Fundus Exam     Slit Lamp Exam       Right Left   Lids/Lashes Dermatochalasis - upper lid, mild MGD Dermatochalasis - upper lid, mild MGD   Conjunctiva/Sclera White and quiet White and quiet   Cornea mild arcus, 1+ inferior Punctate epithelial erosions, trace tear film debris mild arcus, 1+ inferior Punctate epithelial erosions, trace tear film debris   Anterior Chamber Deep and quiet Deep and quiet   Iris Round and dilated Round and dilated   Lens 3+ Nuclear sclerosis with brunescence, 3+ Cortical cataract 3+ Nuclear sclerosis, 3+ Cortical cataract   Anterior Vitreous Mild syneresis Mild syneresis, Posterior vitreous detachment, vitreous condensations         Fundus Exam       Right Left   Disc Pink and Sharp, temporal PPA, Compact Pink and Sharp, temporal PPA, Compact   C/D  Ratio 0.5 0.5   Macula Blunted foveal reflex, +CNV with pigmented RPE rip and surrounding atrophy, shallow SRF -- improved, no frank heme Flat, Blunted foveal reflex, fine drusen, RPE mottling and clumping, No heme or edema   Vessels  attenuated, Tortuous attenuated, Tortuous   Periphery Attached, reticular degeneration, focal pigment clumping IT, No heme Attached, reticular degeneration, focal pigment clumping IT, No heme           Refraction     Wearing Rx       Sphere Cylinder Axis Add   Right -6.00 +1.75 117 +2.50   Left -5.25 +2.25 085 +2.50           IMAGING AND PROCEDURES  Imaging and Procedures for 07/10/2022  OCT, Retina - OU - Both Eyes       Right Eye Quality was good. Central Foveal Thickness: 427. Progression has improved. Findings include no IRF, abnormal foveal contour, subretinal hyper-reflective material, pigment epithelial detachment, subretinal fluid, vitreomacular adhesion (stable improvement in IRF, PED and overlying shallow SRF; trace slivers of SRF inferiorly -- improved, partial PVD).   Left Eye Quality was good. Central Foveal Thickness: 256. Progression has been stable. Findings include normal foveal contour, no IRF, no SRF, retinal drusen , epiretinal membrane, macular pucker (Very mild drusen, mild ERM with early pucker SN mac).   Notes *Images captured and stored on drive  Diagnosis / Impression:  OD: exu ARMD - stable improvement in IRF, PED and overlying shallow SRF; trace slivers of SRF inferiorly -- improved, partial PVD OS: non-exu ARMD - Very mild drusen, mild ERM with early pucker SN mac  Clinical management:  See below  Abbreviations: NFP - Normal foveal profile. CME - cystoid macular edema. PED - pigment epithelial detachment. IRF - intraretinal fluid. SRF - subretinal fluid. EZ - ellipsoid zone. ERM - epiretinal membrane. ORA - outer retinal atrophy. ORT - outer retinal tubulation. SRHM - subretinal hyper-reflective material. IRHM -  intraretinal hyper-reflective material      Intravitreal Injection, Pharmacologic Agent - OD - Right Eye       Time Out 07/10/2022. 9:54 AM. Confirmed correct patient, procedure, site, and patient consented.   Anesthesia Topical anesthesia was used. Anesthetic medications included Lidocaine 2%, Proparacaine 0.5%.   Procedure Preparation included 5% betadine to ocular surface, eyelid speculum. A (32g) needle was used.   Injection: 6 mg faricimab-svoa 6 MG/0.05ML   Route: Intravitreal, Site: Right Eye   NDC: [phone], Lot: [id number], Expiration date: 05/14/2024, Waste: 0 mL   Post-op Post injection exam found visual acuity of at least counting fingers. The patient tolerated the procedure well. There were no complications. The patient received written and verbal post procedure care education. Post injection medications were not given.            ASSESSMENT/PLAN:    ICD-10-CM   1. Exudative age-related macular degeneration of right eye with active choroidal neovascularization (HCC)  H35.3211 OCT, Retina - OU - Both Eyes    Intravitreal Injection, Pharmacologic Agent - OD - Right Eye    faricimab-svoa (VABYSMO) 6mg /0.66mL intravitreal injection    2. 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 Epiretinal membrane (ERM) of left eye  H35.372     4. Essential hypertension  I10     5. Hypertensive retinopathy of both eyes  H35.033     6. Combined forms of age-related cataract of both eyes  H25.813      1. 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, right eye  - s/p IVA OD #1 (03.16.23), #2 (04.13.23), #3 (05.11.23), #4 (06.08.23) -- IVA resistance  - s/p IVE OD #1 (07.06.23), #2 (08.03.23), #3 (08.31.23), #4 (10.05.23) -- IVE resistance  - s/p  IVV OD #1 (11.09.23), #2 (12.07.23), #3 (01.04.24), #4 (02.01.24), #5 (02.29.24) - pt presented initially on 3.16.23 with 2 wk history of central vision loss and distortion  -  initial exam and OCT OD showed large central PED with mild surrounding SRF and mild SRHM overlying - BCVA OD 20/40 -- stable - OCT shows OD: stable improvement in IRF, PED and overlying shallow SRF; trace slivers of SRF inferiorly -- improved, partial PVD at 4 wks - recommend IVV OD #6 today, 03.15.24 w/ f/u in 4 wks - pt wishes to proceed with injection - RBA of procedure discussed, questions answered - IVE informed consent obtained and signed, 07.06.23 (OD) - IVV informed consent obtained and signed, 11.09.23 (OD) - see procedure note  - f/u 4 weeks, DFE, OCT, possible injection  2. Age related macular degeneration, non-exudative, left eye  - mild/intermediate stage with mild drusen  - The incidence, anatomy, and pathology of dry AMD, risk of progression, and the AREDS and AREDS 2 study including smoking risks discussed with patient.   - Recommend amsler grid monitoring  3. Epiretinal membrane, left eye  - mild ERM OS w/ early pucker, SN macula - BCVA 20/40 - asymptomatic, no metamorphopsia - no indication for surgery at this time - monitor for now  4,5. Hypertensive retinopathy OU - discussed importance of tight BP control - monitor   6. Mixed Cataract OU - The symptoms of cataract, surgical options, and treatments and risks were discussed with patient.  - discussed diagnosis and progression - referred to Dr. Lucita Ferrara for consult -- appt in April  [place] Ordered this visit:  Meds ordered this encounter  Medications   faricimab-svoa (VABYSMO) 6mg /0.71mL intravitreal injection     Return in about 4 weeks (around 08/07/2022) for f/u exu ARMD OD, DFE, OCT.  There are no Patient Instructions on file for this visit.   Explained the diagnoses, plan, and follow up with the patient and they expressed understanding.  Patient expressed understanding of the importance of proper follow up care.   This document serves as a record of services personally performed by Gardiner Sleeper, MD, PhD. It was created on their behalf by San Jetty. Owens Shark, OA an ophthalmic technician. The creation of this record is the provider's dictation and/or activities during the visit.    Electronically signed by: San Jetty. Owens Shark, [place] 03.14.2024 12:11 PM   Gardiner Sleeper, M.D., Ph.D. Diseases & Surgery of the Retina and Vitreous [facility]  I have reviewed the above documentation for accuracy and completeness, and I agree with the above. Gardiner Sleeper, M.D., Ph.D. 07/10/22 12:13 PM   Abbreviations: M myopia (nearsighted); A astigmatism; H hyperopia (farsighted); P presbyopia; Mrx spectacle prescription;  CTL contact lenses; OD right eye; OS left eye; OU both eyes  XT exotropia; ET esotropia; PEK punctate epithelial keratitis; PEE punctate epithelial erosions; DES dry eye syndrome; MGD meibomian gland dysfunction; ATs artificial tears; PFAT's preservative free artificial tears; [place] nuclear sclerotic cataract; PSC posterior subcapsular cataract; ERM epi-retinal membrane; PVD posterior vitreous detachment; RD retinal detachment; DM diabetes mellitus; DR diabetic retinopathy; NPDR non-proliferative diabetic retinopathy; PDR proliferative diabetic retinopathy; CSME clinically significant macular edema; DME diabetic macular edema; dbh dot blot hemorrhages; CWS cotton wool spot; POAG primary open angle glaucoma; C/D cup-to-disc ratio; HVF humphrey visual field; GVF goldmann visual field; OCT optical coherence tomography; IOP intraocular pressure; BRVO Branch retinal vein occlusion; CRVO central retinal vein occlusion; CRAO central retinal artery occlusion;  BRAO branch retinal artery occlusion; RT retinal tear; SB scleral buckle; PPV pars plana vitrectomy; VH Vitreous hemorrhage; PRP panretinal laser photocoagulation; IVK intravitreal kenalog; VMT vitreomacular traction; MH Macular hole;  NVD neovascularization of the disc; NVE neovascularization elsewhere; AREDS age related eye  disease study; ARMD age related macular degeneration; POAG primary open angle glaucoma; EBMD epithelial/anterior basement membrane dystrophy; ACIOL anterior chamber intraocular lens; IOL intraocular lens; PCIOL posterior chamber intraocular lens; Phaco/IOL phacoemulsification with intraocular lens placement; [place] photorefractive keratectomy; LASIK laser assisted in situ keratomileusis; HTN hypertension; DM diabetes mellitus; COPD chronic obstructive pulmonary disease

## 2022-07-10 ENCOUNTER — Encounter (INDEPENDENT_AMBULATORY_CARE_PROVIDER_SITE_OTHER): Payer: Self-pay | Admitting: Ophthalmology

## 2022-07-10 ENCOUNTER — Ambulatory Visit (INDEPENDENT_AMBULATORY_CARE_PROVIDER_SITE_OTHER): Payer: Medicare Other | Admitting: Ophthalmology

## 2022-07-10 DIAGNOSIS — H35372 Puckering of macula, left eye: Secondary | ICD-10-CM

## 2022-07-10 DIAGNOSIS — H25813 Combined forms of age-related cataract, bilateral: Secondary | ICD-10-CM

## 2022-07-10 DIAGNOSIS — I10 Essential (primary) hypertension: Secondary | ICD-10-CM | POA: Diagnosis not present

## 2022-07-10 DIAGNOSIS — H35033 Hypertensive retinopathy, bilateral: Secondary | ICD-10-CM

## 2022-07-10 DIAGNOSIS — H353211 Exudative age-related macular degeneration, right eye, with active choroidal neovascularization: Secondary | ICD-10-CM | POA: Diagnosis not present

## 2022-07-10 DIAGNOSIS — H353122 Nonexudative age-related macular degeneration, left eye, intermediate dry stage: Secondary | ICD-10-CM | POA: Diagnosis not present

## 2022-07-10 MED ORDER — FARICIMAB-SVOA 6 MG/0.05ML IZ SOLN
6.0000 mg | INTRAVITREAL | Status: AC | PRN
  Administered 2022-07-10: 6 mg via INTRAVITREAL

## 2022-07-17 ENCOUNTER — Other Ambulatory Visit (INDEPENDENT_AMBULATORY_CARE_PROVIDER_SITE_OTHER): Payer: Self-pay

## 2022-07-17 DIAGNOSIS — H35372 Puckering of macula, left eye: Secondary | ICD-10-CM

## 2022-07-17 DIAGNOSIS — H353211 Exudative age-related macular degeneration, right eye, with active choroidal neovascularization: Secondary | ICD-10-CM

## 2022-07-17 DIAGNOSIS — H35033 Hypertensive retinopathy, bilateral: Secondary | ICD-10-CM

## 2022-07-17 DIAGNOSIS — H353122 Nonexudative age-related macular degeneration, left eye, intermediate dry stage: Secondary | ICD-10-CM

## 2022-07-17 DIAGNOSIS — I10 Essential (primary) hypertension: Secondary | ICD-10-CM

## 2022-07-17 NOTE — Progress Notes (Shared)
[facility] Note  07/17/2022    CHIEF COMPLAINT Patient presents for No chief complaint on file.  HISTORY OF PRESENT ILLNESS: Kimberly Noble is a 77 y.o. female who presents to the clinic today for:     Referring physician: No referring provider defined for this encounter.  HISTORICAL INFORMATION:   Selected notes from the MEDICAL RECORD NUMBER Referred by Dr. Lucianne Lei for PED OD LEE:  Ocular Hx- PMH-    CURRENT MEDICATIONS: No current outpatient medications on file. (Ophthalmic Drugs)   No current facility-administered medications for this visit. (Ophthalmic Drugs)   Current Outpatient Medications (Other)  Medication Sig   ALPRAZolam (XANAX) 1 MG tablet Take 1 mg by mouth 3 (three) times daily. 7am, 1pm, 7pm   Ascorbic Acid (VITAMIN C PO) Take 1 capsule by mouth daily. supplement from [facility]   Biotin w/ Vitamins C & E (HAIR/SKIN/NAILS PO) Take 1 capsule by mouth daily. supplement from [facility]   CALCIUM CARBONATE-VITAMIN D PO Take 1 capsule by mouth daily. supplement from [facility]   Cholecalciferol (VITAMIN D3 PO) Take 1 capsule by mouth daily. supplement from [facility]   Coenzyme Q10 (COQ10 PO) Take 1 capsule by mouth daily.   CRANBERRY PO Take 1 capsule by mouth daily. supplement from [facility]   Multiple Vitamin (MULTIVITAMIN WITH MINERALS) TABS tablet Take 1 tablet by mouth daily. supplement from [facility]   Omega-3 Fatty Acids (FISH OIL PO) Take 1 capsule by mouth daily. supplement from [facility]   OVER THE COUNTER MEDICATION Take 1 capsule by mouth daily. Joint support supplement from [facility]   OVER THE COUNTER MEDICATION Take 1 capsule by mouth daily. Cholesterol care supplement from [facility]   OVER THE COUNTER MEDICATION Take 1 capsule by mouth daily. Circulation and vein support supplement from [facility]   OVER THE COUNTER MEDICATION Take 1 capsule by mouth daily. Green vegetable supplement from [employer]   OVER THE COUNTER MEDICATION Take  1 capsule by mouth daily. Liver and brain supplement from [facility]   OVER THE COUNTER MEDICATION Take 1 capsule by mouth daily. Eye vitamin with lutein - supplement from [facility]   VITAMIN K PO Take 1 capsule by mouth daily. supplement from [facility]   No current facility-administered medications for this visit. (Other)   REVIEW OF SYSTEMS:   ALLERGIES Allergies  Allergen Reactions   Crab Extract Anaphylaxis    Eating crab causes severe allergy. Claims she is not allergic to all shellfish.    Penicillins Rash    Has patient had a PCN reaction causing immediate rash, facial/tongue/throat swelling, SOB or lightheadedness with hypotension: Yes Has patient had a PCN reaction causing severe rash involving mucus membranes or skin necrosis: No Has patient had a PCN reaction that required hospitalization No Has patient had a PCN reaction occurring within the last 10 years: No If all of the above answers are "NO", then may proceed with Cephalosporin use.    PAST MEDICAL HISTORY Past Medical History:  Diagnosis Date   Agoraphobia with panic attacks    Anxiety attack    Major depression    Superficial laceration of hand ~ 03/2015   left thumb   Past Surgical History:  Procedure Laterality Date   TONSILLECTOMY  1950's   FAMILY HISTORY Family History  Problem Relation Age of Onset   Macular degeneration Mother    SOCIAL HISTORY Social History   Tobacco Use  Smoking status: Every Day    Packs/day: 0.33    Years: 40.00    Additional pack years: 0.00    Total pack years: 13.20    Types: Cigarettes   Smokeless tobacco: Never  Vaping Use   Vaping Use: Never used  Substance Use Topics   Alcohol use: No   Drug use: No       OPHTHALMIC EXAM:  Not recorded    IMAGING AND PROCEDURES  Imaging and Procedures for 07/17/2022          ASSESSMENT/PLAN:    ICD-10-CM   1. Exudative age-related macular degeneration of right eye with active choroidal neovascularization   H35.3211     2. 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 Epiretinal membrane (ERM) of left eye  H35.372     4. Essential hypertension  I10     5. Hypertensive retinopathy of both eyes  H35.033      1. 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, right eye  - s/p IVA OD #1 (03.16.23), #2 (04.13.23), #3 (05.11.23), #4 (06.08.23) -- IVA resistance  - s/p IVE OD #1 (07.06.23), #2 (08.03.23), #3 (08.31.23), #4 (10.05.23) -- IVE resistance  - s/p IVV OD #1 (11.09.23), #2 (12.07.23), #3 (01.04.24), #4 (02.01.24), #5 (02.29.24), #6 (03.15.24) - pt presented initially on 3.16.23 with 2 wk history of central vision loss and distortion  - initial exam and OCT OD showed large central PED with mild surrounding SRF and mild SRHM overlying - BCVA OD 20/40 -- stable - OCT shows OD: stable improvement in IRF, PED and overlying shallow SRF; trace slivers of SRF inferiorly -- improved, partial PVD at 4 wks - recommend IVV OD #7 today, 04.25.24 w/ f/u in 4 wks - pt wishes to proceed with injection - RBA of procedure discussed, questions answered - IVE informed consent obtained and signed, 07.06.23 (OD) - IVV informed consent obtained and signed, 11.09.23 (OD) - see procedure note  - f/u 4 weeks, DFE, OCT, possible injection  2. Age related macular degeneration, non-exudative, left eye  - mild/intermediate stage with mild drusen  - The incidence, anatomy, and pathology of dry AMD, risk of progression, and the AREDS and AREDS 2 study including smoking risks discussed with patient.   - Recommend amsler grid monitoring  3. Epiretinal membrane, left eye  - mild ERM OS w/ early pucker, SN macula - BCVA 20/40 - asymptomatic, no metamorphopsia - no indication for surgery at this time - monitor for now  4,5. Hypertensive retinopathy OU - discussed importance of tight BP control - monitor   6. Mixed Cataract OU - The symptoms of cataract, surgical  options, and treatments and risks were discussed with patient.  - discussed diagnosis and progression - referred to Dr. Lucita Ferrara for consult -- appt in April  [place] Ordered this visit:  No orders of the defined types were placed in this encounter.    No follow-ups on file.  There are no Patient Instructions on file for this visit.   Explained the diagnoses, plan, and follow up with the patient and they expressed understanding.  Patient expressed understanding of the importance of proper follow up care.   This document serves as a record of services personally performed by Gardiner Sleeper, MD, PhD. It was created on their behalf by San Jetty. Owens Shark, OA an ophthalmic technician. The creation of this record is the provider's dictation and/or activities during the visit.  Electronically signed by: San Jetty. Owens Shark, [place] 04.04.2024 1:50 PM    Gardiner Sleeper, M.D., Ph.D. Diseases & Surgery of the Retina and Vitreous [facility]: M myopia (nearsighted); A astigmatism; H hyperopia (farsighted); P presbyopia; Mrx spectacle prescription;  CTL contact lenses; OD right eye; OS left eye; OU both eyes  XT exotropia; ET esotropia; PEK punctate epithelial keratitis; PEE punctate epithelial erosions; DES dry eye syndrome; MGD meibomian gland dysfunction; ATs artificial tears; PFAT's preservative free artificial tears; [place] nuclear sclerotic cataract; PSC posterior subcapsular cataract; ERM epi-retinal membrane; PVD posterior vitreous detachment; RD retinal detachment; DM diabetes mellitus; DR diabetic retinopathy; NPDR non-proliferative diabetic retinopathy; PDR proliferative diabetic retinopathy; CSME clinically significant macular edema; DME diabetic macular edema; dbh dot blot hemorrhages; CWS cotton wool spot; POAG primary open angle glaucoma; C/D cup-to-disc ratio; HVF humphrey visual field; GVF goldmann visual field; OCT optical coherence  tomography; IOP intraocular pressure; BRVO Branch retinal vein occlusion; CRVO central retinal vein occlusion; CRAO central retinal artery occlusion; BRAO branch retinal artery occlusion; RT retinal tear; SB scleral buckle; PPV pars plana vitrectomy; VH Vitreous hemorrhage; PRP panretinal laser photocoagulation; IVK intravitreal kenalog; VMT vitreomacular traction; MH Macular hole;  NVD neovascularization of the disc; NVE neovascularization elsewhere; AREDS age related eye disease study; ARMD age related macular degeneration; POAG primary open angle glaucoma; EBMD epithelial/anterior basement membrane dystrophy; ACIOL anterior chamber intraocular lens; IOL intraocular lens; PCIOL posterior chamber intraocular lens; Phaco/IOL phacoemulsification with intraocular lens placement; [place] photorefractive keratectomy; LASIK laser assisted in situ keratomileusis; HTN hypertension; DM diabetes mellitus; COPD chronic obstructive pulmonary disease

## 2022-07-25 NOTE — Progress Notes (Signed)
[facility] - Clinic Note  08/07/2022    CHIEF COMPLAINT Patient presents for Retina Follow Up  HISTORY OF PRESENT ILLNESS: Kimberly Noble is a 77 y.o. female who presents to the clinic today for:   HPI     Retina Follow Up   Patient presents with  Wet AMD.  In right eye.  This started 4 weeks ago.  I, the attending physician,  performed the HPI with the patient and updated documentation appropriately.        Comments   Patient here for 4 weeks retina follow up for exu ARMD OD. Patient states vision it seems about the same. Only can tell is by the TV. OD not worse. OS can see-not wet yet. No eye pain.       Last edited by Rennis Chris, MD on 08/07/2022 10:10 AM.    Pt states vision in the right eye is the same, pt had an appt with Dr. Delaney Meigs on Wednesday for cataract consult, she states on Monday, she started getting nervous about having 2 appts in one week and had to reschedule her appt  Referring physician: No referring provider defined for this encounter.  HISTORICAL INFORMATION:   Selected notes from the MEDICAL RECORD NUMBER Referred by Dr. Zenaida Niece for PED OD LEE:  Ocular Hx- PMH-    CURRENT MEDICATIONS: No current outpatient medications on file. (Ophthalmic Drugs)   No current facility-administered medications for this visit. (Ophthalmic Drugs)   Current Outpatient Medications (Other)  Medication Sig   ALPRAZolam (XANAX) 1 MG tablet Take 1 mg by mouth 3 (three) times daily. 7am, 1pm, 7pm   Ascorbic Acid (VITAMIN C PO) Take 1 capsule by mouth daily. supplement from [facility]   Biotin w/ Vitamins C & E (HAIR/SKIN/NAILS PO) Take 1 capsule by mouth daily. supplement from [facility]   CALCIUM CARBONATE-VITAMIN D PO Take 1 capsule by mouth daily. supplement from [facility]   Cholecalciferol (VITAMIN D3 PO) Take 1 capsule by mouth daily. supplement from [facility]   Coenzyme Q10 (COQ10 PO) Take 1 capsule by mouth daily.   CRANBERRY PO Take 1  capsule by mouth daily. supplement from [facility]   Multiple Vitamin (MULTIVITAMIN WITH MINERALS) TABS tablet Take 1 tablet by mouth daily. supplement from [facility]   Omega-3 Fatty Acids (FISH OIL PO) Take 1 capsule by mouth daily. supplement from [facility]   OVER THE COUNTER MEDICATION Take 1 capsule by mouth daily. Joint support supplement from [facility]   OVER THE COUNTER MEDICATION Take 1 capsule by mouth daily. Cholesterol care supplement from [facility]   OVER THE COUNTER MEDICATION Take 1 capsule by mouth daily. Circulation and vein support supplement from [facility]   OVER THE COUNTER MEDICATION Take 1 capsule by mouth daily. Green vegetable supplement from [employer]   OVER THE COUNTER MEDICATION Take 1 capsule by mouth daily. Liver and brain supplement from [facility]   OVER THE COUNTER MEDICATION Take 1 capsule by mouth daily. Eye vitamin with lutein - supplement from [facility]   VITAMIN K PO Take 1 capsule by mouth daily. supplement from [facility]   No current facility-administered medications for this visit. (Other)   REVIEW OF SYSTEMS: ROS   Positive for: Neurological, Eyes Negative for: Constitutional, Gastrointestinal, Skin, Genitourinary, Musculoskeletal, HENT, Endocrine, Cardiovascular, Respiratory, Psychiatric, Allergic/Imm, Heme/Lymph Last edited by Laddie Aquas, COA on 08/07/2022  8:59 AM.     ALLERGIES Allergies  Allergen  Reactions   Crab Extract Anaphylaxis    Eating crab causes severe allergy. Claims she is not allergic to all shellfish.    Penicillins Rash    Has patient had a PCN reaction causing immediate rash, facial/tongue/throat swelling, SOB or lightheadedness with hypotension: Yes Has patient had a PCN reaction causing severe rash involving mucus membranes or skin necrosis: No Has patient had a PCN reaction that required hospitalization No Has patient had a PCN reaction occurring within the last 10 years: No If all of the above answers are "NO", then may proceed  with Cephalosporin use.    PAST MEDICAL HISTORY Past Medical History:  Diagnosis Date   Agoraphobia with panic attacks    Anxiety attack    Major depression    Superficial laceration of hand ~ 03/2015   left thumb   Past Surgical History:  Procedure Laterality Date   TONSILLECTOMY  1950's   FAMILY HISTORY Family History  Problem Relation Age of Onset   Macular degeneration Mother    SOCIAL HISTORY Social History   Tobacco Use   Smoking status: Every Day    Packs/day: 0.33    Years: 40.00    Additional pack years: 0.00    Total pack years: 13.20    Types: Cigarettes   Smokeless tobacco: Never  Vaping Use   Vaping Use: Never used  Substance Use Topics   Alcohol use: No   Drug use: No       OPHTHALMIC EXAM:  Base Eye Exam     Visual Acuity (Snellen - Linear)       Right Left   Dist cc 20/50 -2 20/40   Dist ph cc 20/40 -2 20/30    Correction: Glasses         Tonometry (Tonopen, 8:56 AM)       Right Left   Pressure 17 17         Pupils       Dark Light Shape React APD   Right 4 3 Round Brisk None   Left 4 3 Round Brisk None         Visual Fields (Counting fingers)       Left Right    Full Full         Extraocular Movement       Right Left    Full, Ortho Full, Ortho         Neuro/Psych     Oriented x3: Yes   Mood/Affect: Normal         Dilation     Both eyes: 1.0% Mydriacyl, 2.5% Phenylephrine @ 8:56 AM           Slit Lamp and Fundus Exam     Slit Lamp Exam       Right Left   Lids/Lashes Dermatochalasis - upper lid, mild MGD Dermatochalasis - upper lid, mild MGD   Conjunctiva/Sclera White and quiet White and quiet   Cornea mild arcus, 1+ inferior Punctate epithelial erosions, trace tear film debris mild arcus, 1+ inferior Punctate epithelial erosions, trace tear film debris   Anterior Chamber Deep and quiet Deep and quiet   Iris Round and dilated Round and dilated   Lens 3+ Nuclear sclerosis with brunescence,  3+ Cortical cataract 3+ Nuclear sclerosis, 3+ Cortical cataract   Anterior Vitreous Mild syneresis Mild syneresis, Posterior vitreous detachment, vitreous condensations         Fundus Exam       Right Left   Disc Pink  and Sharp, temporal PPA, Compact Pink and Sharp, temporal PPA, Compact   C/D Ratio 0.5 0.5   Macula Blunted foveal reflex, +CNV with pigmented RPE rip and surrounding atrophy, shallow SRF -- improved, no frank heme Flat, Blunted foveal reflex, fine drusen, RPE mottling and clumping, No heme or edema   Vessels attenuated, mild tortuosity attenuated, Tortuous   Periphery Attached, reticular degeneration, focal pigment clumping IT, No heme Attached, reticular degeneration, focal pigment clumping IT, No heme           Refraction     Wearing Rx       Sphere Cylinder Axis Add   Right -6.00 +1.75 117 +2.50   Left -5.25 +2.25 085 +2.50           IMAGING AND PROCEDURES  Imaging and Procedures for 08/07/2022  OCT, Retina - OU - Both Eyes       Right Eye Quality was good. Central Foveal Thickness: 337. Progression has been stable. Findings include no IRF, abnormal foveal contour, subretinal hyper-reflective material, pigment epithelial detachment, subretinal fluid, vitreomacular adhesion (stable improvement in IRF, PED and overlying shallow SRF; trace slivers of SRF inferiorly, partial PVD).   Left Eye Quality was good. Central Foveal Thickness: 257. Progression has been stable. Findings include normal foveal contour, no IRF, no SRF, retinal drusen , epiretinal membrane, macular pucker (Very mild drusen, mild ERM with early pucker SN mac).   Notes *Images captured and stored on drive  Diagnosis / Impression:  OD: exu ARMD - stable improvement in IRF, PED and overlying shallow SRF; trace slivers of SRF inferiorly, partial PVD OS: non-exu ARMD - Very mild drusen, mild ERM with early pucker SN mac  Clinical management:  See below  Abbreviations: NFP - Normal  foveal profile. CME - cystoid macular edema. PED - pigment epithelial detachment. IRF - intraretinal fluid. SRF - subretinal fluid. EZ - ellipsoid zone. ERM - epiretinal membrane. ORA - outer retinal atrophy. ORT - outer retinal tubulation. SRHM - subretinal hyper-reflective material. IRHM - intraretinal hyper-reflective material      Intravitreal Injection, Pharmacologic Agent - OD - Right Eye       Time Out 08/07/2022. 8:51 AM. Confirmed correct patient, procedure, site, and patient consented.   Anesthesia Topical anesthesia was used. Anesthetic medications included Lidocaine 2%, Proparacaine 0.5%.   Procedure Preparation included 5% betadine to ocular surface, eyelid speculum. A (32g) needle was used.   Injection: 6 mg faricimab-svoa 6 MG/0.05ML   Route: Intravitreal, Site: Right Eye   NDC: O8010301, Lot: [id number], Expiration date: 06/11/2024, Waste: 0 mL   Post-op Post injection exam found visual acuity of at least counting fingers. The patient tolerated the procedure well. There were no complications. The patient received written and verbal post procedure care education. Post injection medications were not given.            ASSESSMENT/PLAN:    ICD-10-CM   1. Exudative age-related macular degeneration of right eye with active choroidal neovascularization  H35.3211 OCT, Retina - OU - Both Eyes    Intravitreal Injection, Pharmacologic Agent - OD - Right Eye    faricimab-svoa (VABYSMO) 6mg /0.74mL intravitreal injection    2. 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 Epiretinal membrane (ERM) of left eye  H35.372     4. Essential hypertension  I10     5. Hypertensive retinopathy of both eyes  H35.033     6. Combined forms of age-related cataract of  both eyes  H25.813      1. 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, right eye  - s/p IVA OD #1 (03.16.23), #2 (04.13.23), #3 (05.11.23), #4 (06.08.23) -- IVA  resistance  - s/p IVE OD #1 (07.06.23), #2 (08.03.23), #3 (08.31.23), #4 (10.05.23) -- IVE resistance  - s/p IVV OD #1 (11.09.23), #2 (12.07.23), #3 (01.04.24), #4 (02.01.24), #5 (02.29.24), #6 (03.15.24) - pt presented initially on 3.16.23 with 2 wk history of central vision loss and distortion  - initial exam and OCT OD showed large central PED with mild surrounding SRF and mild SRHM overlying - BCVA OD 20/40 -- stable - OCT shows OD: stable improvement in IRF, PED and overlying shallow SRF; trace slivers of SRF inferiorly -- improved, partial PVD at 4 wks - recommend IVV OD #7 today, 04.25.24 w/ f/u in 4 wks - pt wishes to proceed with injection - RBA of procedure discussed, questions answered - IVE informed consent obtained and signed, 07.06.23 (OD) - IVV informed consent obtained and signed, 11.09.23 (OD) - see procedure note  - f/u 4 weeks, DFE, OCT, possible injection  2. Age related macular degeneration, non-exudative, left eye  - mild/intermediate stage with mild drusen  - The incidence, anatomy, and pathology of dry AMD, risk of progression, and the AREDS and AREDS 2 study including smoking risks discussed with patient.   - Recommend amsler grid monitoring  3. Epiretinal membrane, left eye  - mild ERM OS w/ early pucker, SN macula - BCVA 20/40 - asymptomatic, no metamorphopsia - no indication for surgery at this time - monitor for now  4,5. Hypertensive retinopathy OU - discussed importance of tight BP control - monitor   6. Mixed Cataract OU - The symptoms of cataract, surgical options, and treatments and risks were discussed with patient.  - discussed diagnosis and progression - referred to Dr. Delaney Meigs for consult -- pt had appt on April 23rd, but had to reschedule -- does not have appt yet  Ophthalmic Meds Ordered this visit:  Meds ordered this encounter  Medications   faricimab-svoa (VABYSMO) /0.67mL intravitreal injection     Return in about 4 weeks  (around 09/04/2022) for f/u exu ARMD OD, DFE, OCT.  There are no Patient Instructions on file for this visit.   Explained the diagnoses, plan, and follow up with the patient and they expressed understanding.  Patient expressed understanding of the importance of proper follow up care.   This document serves as a record of services personally performed by Karie Chimera, MD, PhD. It was created on their behalf by Glee Arvin. Manson Passey, OA an ophthalmic technician. The creation of this record is the provider's dictation and/or activities during the visit.    Electronically signed by: Glee Arvin. Manson Passey, [place] 04.04.2024 10:12 AM  Karie Chimera, M.D., Ph.D. Diseases & Surgery of the Retina and Vitreous [facility]  I have reviewed the above documentation for accuracy and completeness, and I agree with the above. Karie Chimera, M.D., Ph.D. 08/07/22 10:13 AM   Abbreviations: M myopia (nearsighted); A astigmatism; H hyperopia (farsighted); P presbyopia; Mrx spectacle prescription;  CTL contact lenses; OD right eye; OS left eye; OU both eyes  XT exotropia; ET esotropia; PEK punctate epithelial keratitis; PEE punctate epithelial erosions; DES dry eye syndrome; MGD meibomian gland dysfunction; ATs artificial tears; PFAT's preservative free artificial tears; NSC nuclear sclerotic cataract; PSC posterior subcapsular cataract; ERM epi-retinal membrane; PVD posterior vitreous detachment; RD retinal detachment; DM diabetes mellitus;  DR diabetic retinopathy; NPDR non-proliferative diabetic retinopathy; PDR proliferative diabetic retinopathy; CSME clinically significant macular edema; DME diabetic macular edema; dbh dot blot hemorrhages; CWS cotton wool spot; POAG primary open angle glaucoma; C/D cup-to-disc ratio; HVF humphrey visual field; GVF goldmann visual field; OCT optical coherence tomography; IOP intraocular pressure; BRVO Branch retinal vein occlusion; CRVO central retinal vein occlusion;  CRAO central retinal artery occlusion; BRAO branch retinal artery occlusion; RT retinal tear; SB scleral buckle; PPV pars plana vitrectomy; VH Vitreous hemorrhage; PRP panretinal laser photocoagulation; IVK intravitreal kenalog; VMT vitreomacular traction; MH Macular hole;  NVD neovascularization of the disc; NVE neovascularization elsewhere; AREDS age related eye disease study; ARMD age related macular degeneration; POAG primary open angle glaucoma; EBMD epithelial/anterior basement membrane dystrophy; ACIOL anterior chamber intraocular lens; IOL intraocular lens; PCIOL posterior chamber intraocular lens; Phaco/IOL phacoemulsification with intraocular lens placement; PRK photorefractive keratectomy; LASIK laser assisted in situ keratomileusis; HTN hypertension; DM diabetes mellitus; COPD chronic obstructive pulmonary disease

## 2022-08-07 ENCOUNTER — Ambulatory Visit (INDEPENDENT_AMBULATORY_CARE_PROVIDER_SITE_OTHER): Payer: Medicare Other | Admitting: Ophthalmology

## 2022-08-07 ENCOUNTER — Encounter (INDEPENDENT_AMBULATORY_CARE_PROVIDER_SITE_OTHER): Payer: Self-pay | Admitting: Ophthalmology

## 2022-08-07 DIAGNOSIS — H35372 Puckering of macula, left eye: Secondary | ICD-10-CM

## 2022-08-07 DIAGNOSIS — H25813 Combined forms of age-related cataract, bilateral: Secondary | ICD-10-CM

## 2022-08-07 DIAGNOSIS — H353211 Exudative age-related macular degeneration, right eye, with active choroidal neovascularization: Secondary | ICD-10-CM | POA: Diagnosis not present

## 2022-08-07 DIAGNOSIS — I10 Essential (primary) hypertension: Secondary | ICD-10-CM

## 2022-08-07 DIAGNOSIS — H35033 Hypertensive retinopathy, bilateral: Secondary | ICD-10-CM

## 2022-08-07 DIAGNOSIS — H353122 Nonexudative age-related macular degeneration, left eye, intermediate dry stage: Secondary | ICD-10-CM

## 2022-08-07 MED ORDER — FARICIMAB-SVOA 6 MG/0.05ML IZ SOLN
6.0000 mg | INTRAVITREAL | Status: AC | PRN
  Administered 2022-08-07: 6 mg via INTRAVITREAL

## 2022-09-04 ENCOUNTER — Encounter (INDEPENDENT_AMBULATORY_CARE_PROVIDER_SITE_OTHER): Payer: Medicare Other | Admitting: Ophthalmology

## 2022-09-04 DIAGNOSIS — I10 Essential (primary) hypertension: Secondary | ICD-10-CM

## 2022-09-04 DIAGNOSIS — H25813 Combined forms of age-related cataract, bilateral: Secondary | ICD-10-CM

## 2022-09-04 DIAGNOSIS — H35033 Hypertensive retinopathy, bilateral: Secondary | ICD-10-CM

## 2022-09-04 DIAGNOSIS — H35372 Puckering of macula, left eye: Secondary | ICD-10-CM

## 2022-09-04 DIAGNOSIS — H353122 Nonexudative age-related macular degeneration, left eye, intermediate dry stage: Secondary | ICD-10-CM

## 2022-09-04 DIAGNOSIS — H353211 Exudative age-related macular degeneration, right eye, with active choroidal neovascularization: Secondary | ICD-10-CM

## 2022-09-04 NOTE — Progress Notes (Signed)
[facility] - Clinic Note  09/11/2022    CHIEF COMPLAINT Patient presents for Retina Follow Up  HISTORY OF PRESENT ILLNESS: Kimberly Noble is a 77 y.o. female who presents to the clinic today for:   HPI     Retina Follow Up   Patient presents with  Wet AMD.  In both eyes.  This started 5 weeks ago.  Duration of 5 weeks.  Since onset it is stable.  I, the attending physician,  performed the HPI with the patient and updated documentation appropriately.        Comments   5 week retina follow up ARMD and IVV OD pt is reporting no vision changes noticed she denies any flashes or floaters      Last edited by Rennis Chris, MD on 09/11/2022  4:50 PM.    Pt states she is late to follow up by one week due to vertigo, she is having bad anxiety today, has not gotten back in touch with Dr. Ronnie Doss office  Referring physician: No referring provider defined for this encounter.  HISTORICAL INFORMATION:   Selected notes from the MEDICAL RECORD NUMBER Referred by Dr. Zenaida Niece for PED OD LEE:  Ocular Hx- PMH-    CURRENT MEDICATIONS: No current outpatient medications on file. (Ophthalmic Drugs)   No current facility-administered medications for this visit. (Ophthalmic Drugs)   Current Outpatient Medications (Other)  Medication Sig   ALPRAZolam (XANAX) 1 MG tablet Take 1 mg by mouth 3 (three) times daily. 7am, 1pm, 7pm   Ascorbic Acid (VITAMIN C PO) Take 1 capsule by mouth daily. supplement from [facility]   Biotin w/ Vitamins C & E (HAIR/SKIN/NAILS PO) Take 1 capsule by mouth daily. supplement from [facility]   CALCIUM CARBONATE-VITAMIN D PO Take 1 capsule by mouth daily. supplement from [facility]   Cholecalciferol (VITAMIN D3 PO) Take 1 capsule by mouth daily. supplement from [facility]   Coenzyme Q10 (COQ10 PO) Take 1 capsule by mouth daily.   CRANBERRY PO Take 1 capsule by mouth daily. supplement from [facility]   Multiple Vitamin (MULTIVITAMIN WITH MINERALS) TABS  tablet Take 1 tablet by mouth daily. supplement from [facility]   Omega-3 Fatty Acids (FISH OIL PO) Take 1 capsule by mouth daily. supplement from [facility]   OVER THE COUNTER MEDICATION Take 1 capsule by mouth daily. Joint support supplement from [facility]   OVER THE COUNTER MEDICATION Take 1 capsule by mouth daily. Cholesterol care supplement from [facility]   OVER THE COUNTER MEDICATION Take 1 capsule by mouth daily. Circulation and vein support supplement from [facility]   OVER THE COUNTER MEDICATION Take 1 capsule by mouth daily. Green vegetable supplement from [employer]   OVER THE COUNTER MEDICATION Take 1 capsule by mouth daily. Liver and brain supplement from [facility]   OVER THE COUNTER MEDICATION Take 1 capsule by mouth daily. Eye vitamin with lutein - supplement from [facility]   VITAMIN K PO Take 1 capsule by mouth daily. supplement from [facility]   No current facility-administered medications for this visit. (Other)   REVIEW OF SYSTEMS: ROS   Positive for: Neurological, Eyes Negative for: Constitutional, Gastrointestinal, Skin, Genitourinary, Musculoskeletal, HENT, Endocrine, Cardiovascular, Respiratory, Psychiatric, Allergic/Imm, Heme/Lymph Last edited by Etheleen Mayhew, COT on 09/11/2022  8:53 AM.      ALLERGIES Allergies  Allergen Reactions   Crab Extract Anaphylaxis    Eating crab causes severe allergy. Claims she is  not allergic to all shellfish.    Penicillins Rash    Has patient had a PCN reaction causing immediate rash, facial/tongue/throat swelling, SOB or lightheadedness with hypotension: Yes Has patient had a PCN reaction causing severe rash involving mucus membranes or skin necrosis: No Has patient had a PCN reaction that required hospitalization No Has patient had a PCN reaction occurring within the last 10 years: No If all of the above answers are "NO", then may proceed with Cephalosporin use.    PAST MEDICAL HISTORY Past Medical History:  Diagnosis Date    Agoraphobia with panic attacks    Anxiety attack    Major depression    Superficial laceration of hand ~ 03/2015   left thumb   Past Surgical History:  Procedure Laterality Date   TONSILLECTOMY  1950's   FAMILY HISTORY Family History  Problem Relation Age of Onset   Macular degeneration Mother    SOCIAL HISTORY Social History   Tobacco Use   Smoking status: Every Day    Packs/day: 0.33    Years: 40.00    Additional pack years: 0.00    Total pack years: 13.20    Types: Cigarettes   Smokeless tobacco: Never  Vaping Use   Vaping Use: Never used  Substance Use Topics   Alcohol use: No   Drug use: No       OPHTHALMIC EXAM:  Base Eye Exam     Visual Acuity (Snellen - Linear)       Right Left   Dist cc 20/50 -2 20/40 -1   Dist ph cc NI 20/30 -3         Tonometry (Tonopen, 8:57 AM)       Right Left   Pressure 13 14         Pupils       Pupils Dark Light Shape React APD   Right PERRL 4 3 Round Brisk None   Left PERRL 4 3 Round Brisk None         Visual Fields       Left Right    Full Full         Extraocular Movement       Right Left    Full, Ortho Full, Ortho         Neuro/Psych     Oriented x3: Yes   Mood/Affect: Normal         Dilation     Both eyes: 2.5% Phenylephrine @ 8:54 AM           Slit Lamp and Fundus Exam     Slit Lamp Exam       Right Left   Lids/Lashes Dermatochalasis - upper lid, mild MGD Dermatochalasis - upper lid, mild MGD   Conjunctiva/Sclera White and quiet White and quiet   Cornea mild arcus, 1+ inferior Punctate epithelial erosions, trace tear film debris mild arcus, 1+ inferior Punctate epithelial erosions, trace tear film debris   Anterior Chamber Deep and quiet Deep and quiet   Iris Round and dilated Round and dilated   Lens 3+ Nuclear sclerosis with brunescence, 3+ Cortical cataract 3+ Nuclear sclerosis, 3+ Cortical cataract   Anterior Vitreous Mild syneresis Mild syneresis, Posterior vitreous  detachment, vitreous condensations         Fundus Exam       Right Left   Disc Pink and Sharp, temporal PPA, Compact Pink and Sharp, temporal PPA, Compact   C/D Ratio 0.5 0.5   Macula Blunted foveal  reflex, +CNV with pigmented RPE rip and surrounding atrophy, shallow SRF -- slightly increased, no frank heme Flat, Blunted foveal reflex, fine drusen, RPE mottling and clumping, No heme or edema   Vessels attenuated, mild tortuosity attenuated, Tortuous   Periphery Attached, reticular degeneration, focal pigment clumping IT, No heme Attached, reticular degeneration, focal pigment clumping IT, No heme           Refraction     Wearing Rx       Sphere Cylinder Axis Add   Right -6.00 +1.75 117 +2.50   Left -5.25 +2.25 085 +2.50           IMAGING AND PROCEDURES  Imaging and Procedures for 09/11/2022  OCT, Retina - OU - Both Eyes       Right Eye Quality was good. Central Foveal Thickness: 453. Progression has worsened. Findings include no IRF, abnormal foveal contour, subretinal hyper-reflective material, pigment epithelial detachment, subretinal fluid, vitreomacular adhesion (stable improvement in IRF and PED, interval increase in surrounding SRF, partial PVD).   Left Eye Quality was good. Central Foveal Thickness: 253. Progression has been stable. Findings include normal foveal contour, no IRF, no SRF, retinal drusen , epiretinal membrane, macular pucker (Very mild drusen, mild ERM with early pucker SN mac).   Notes *Images captured and stored on drive  Diagnosis / Impression:  OD: exu ARMD - stable improvement in IRF and PED, interval increase in surrounding SRF, partial PVD OS: non-exu ARMD - Very mild drusen, mild ERM with early pucker SN mac  Clinical management:  See below  Abbreviations: NFP - Normal foveal profile. CME - cystoid macular edema. PED - pigment epithelial detachment. IRF - intraretinal fluid. SRF - subretinal fluid. EZ - ellipsoid zone. ERM -  epiretinal membrane. ORA - outer retinal atrophy. ORT - outer retinal tubulation. SRHM - subretinal hyper-reflective material. IRHM - intraretinal hyper-reflective material      Intravitreal Injection, Pharmacologic Agent - OD - Right Eye       Time Out 09/11/2022. 9:34 AM. Confirmed correct patient, procedure, site, and patient consented.   Anesthesia Topical anesthesia was used. Anesthetic medications included Lidocaine 2%, Proparacaine 0.5%.   Procedure Preparation included 5% betadine to ocular surface, eyelid speculum. A (32g) needle was used.   Injection: 6 mg faricimab-svoa 6 MG/0.05ML   Route: Intravitreal, Site: Right Eye   NDC: [phone], Lot: [id number], Expiration date: 07/12/2024, Waste: 0 mL   Post-op Post injection exam found visual acuity of at least counting fingers. The patient tolerated the procedure well. There were no complications. The patient received written and verbal post procedure care education. Post injection medications were not given.             ASSESSMENT/PLAN:    ICD-10-CM   1. Exudative age-related macular degeneration of right eye with active choroidal neovascularization (HCC)  H35.3211 OCT, Retina - OU - Both Eyes    Intravitreal Injection, Pharmacologic Agent - OD - Right Eye    faricimab-svoa (VABYSMO) 6mg /0.40mL intravitreal injection    2. 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 Epiretinal membrane (ERM) of left eye  H35.372     4. Essential hypertension  I10     5. Hypertensive retinopathy of both eyes  H35.033     6. Combined forms of age-related cataract of both eyes  H25.813      1. 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, right eye  - f/u delayed by 1 wk -- 5  wks instead of 4  - s/p IVA OD #1 (03.16.23), #2 (04.13.23), #3 (05.11.23), #4 (06.08.23) -- IVA resistance  - s/p IVE OD #1 (07.06.23), #2 (08.03.23), #3 (08.31.23), #4 (10.05.23) -- IVE resistance  - s/p  IVV OD #1 (11.09.23), #2 (12.07.23), #3 (01.04.24), #4 (02.01.24), #5 (02.29.24), #6 (03.15.24), #7 (04.25.24) - pt presented initially on 3.16.23 with 2 wk history of central vision loss and distortion  - initial exam and OCT OD showed large central PED with mild surrounding SRF and mild SRHM overlying - BCVA OD 20/50 from 20/40 - OCT shows OD: stable improvement in IRF and PED, interval increase in surrounding SRF, partial PVD at 4 weeks **interval increase in IRF at 5 weeks, noted on 05.30.24 exam* - recommend IVV OD #8 today, 05.30.24 w/ f/u back to 4 wks - pt wishes to proceed with injection - RBA of procedure discussed, questions answered - IVE informed consent obtained and signed, 07.06.23 (OD) - IVV informed consent obtained and signed, 11.09.23 (OD) - see procedure note  - f/u 4 weeks, DFE, OCT, possible injection  2. Age related macular degeneration, non-exudative, left eye  - mild/intermediate stage with mild drusen  - The incidence, anatomy, and pathology of dry AMD, risk of progression, and the AREDS and AREDS 2 study including smoking risks discussed with patient.   - Recommend amsler grid monitoring  3. Epiretinal membrane, left eye  - mild ERM OS w/ early pucker, SN macula - BCVA 20/40 - asymptomatic, no metamorphopsia - no indication for surgery at this time - monitor for now  4,5. Hypertensive retinopathy OU - discussed importance of tight BP control - monitor   6. Mixed Cataract OU - The symptoms of cataract, surgical options, and treatments and risks were discussed with patient.  - discussed diagnosis and progression - referred to Dr. Delaney Meigs for consult -- pt had appt on April 23rd, but had to reschedule -- does not have appt yet  Ophthalmic Meds Ordered this visit:  Meds ordered this encounter  Medications   faricimab-svoa (VABYSMO) 6mg /0.65mL intravitreal injection     Return in about 4 weeks (around 10/09/2022) for f/u exu ARMD OD, DFE,  OCT.  There are no Patient Instructions on file for this visit.   Explained the diagnoses, plan, and follow up with the patient and they expressed understanding.  Patient expressed understanding of the importance of proper follow up care.   This document serves as a record of services personally performed by Karie Chimera, MD, PhD. It was created on their behalf by Glee Arvin. Manson Passey, OA an ophthalmic technician. The creation of this record is the provider's dictation and/or activities during the visit.    Electronically signed by: Glee Arvin. Manson Passey, [place] 05.23.2024 4:51 PM  Karie Chimera, M.D., Ph.D. Diseases & Surgery of the Retina and Vitreous [facility]  I have reviewed the above documentation for accuracy and completeness, and I agree with the above. Karie Chimera, M.D., Ph.D. 09/11/22 4:52 PM  Abbreviations: M myopia (nearsighted); A astigmatism; H hyperopia (farsighted); P presbyopia; Mrx spectacle prescription;  CTL contact lenses; OD right eye; OS left eye; OU both eyes  XT exotropia; ET esotropia; PEK punctate epithelial keratitis; PEE punctate epithelial erosions; DES dry eye syndrome; MGD meibomian gland dysfunction; ATs artificial tears; PFAT's preservative free artificial tears; NSC nuclear sclerotic cataract; PSC posterior subcapsular cataract; ERM epi-retinal membrane; PVD posterior vitreous detachment; RD retinal detachment; DM diabetes mellitus; DR diabetic retinopathy; NPDR non-proliferative  diabetic retinopathy; PDR proliferative diabetic retinopathy; CSME clinically significant macular edema; DME diabetic macular edema; dbh dot blot hemorrhages; CWS cotton wool spot; POAG primary open angle glaucoma; C/D cup-to-disc ratio; HVF humphrey visual field; GVF goldmann visual field; OCT optical coherence tomography; IOP intraocular pressure; BRVO Branch retinal vein occlusion; CRVO central retinal vein occlusion; CRAO central retinal artery occlusion; BRAO  branch retinal artery occlusion; RT retinal tear; SB scleral buckle; PPV pars plana vitrectomy; VH Vitreous hemorrhage; PRP panretinal laser photocoagulation; IVK intravitreal kenalog; VMT vitreomacular traction; MH Macular hole;  NVD neovascularization of the disc; NVE neovascularization elsewhere; AREDS age related eye disease study; ARMD age related macular degeneration; POAG primary open angle glaucoma; EBMD epithelial/anterior basement membrane dystrophy; ACIOL anterior chamber intraocular lens; IOL intraocular lens; PCIOL posterior chamber intraocular lens; Phaco/IOL phacoemulsification with intraocular lens placement; PRK photorefractive keratectomy; LASIK laser assisted in situ keratomileusis; HTN hypertension; DM diabetes mellitus; COPD chronic obstructive pulmonary disease

## 2022-09-11 ENCOUNTER — Encounter (INDEPENDENT_AMBULATORY_CARE_PROVIDER_SITE_OTHER): Payer: Self-pay | Admitting: Ophthalmology

## 2022-09-11 ENCOUNTER — Ambulatory Visit (INDEPENDENT_AMBULATORY_CARE_PROVIDER_SITE_OTHER): Payer: Medicare Other | Admitting: Ophthalmology

## 2022-09-11 DIAGNOSIS — H353122 Nonexudative age-related macular degeneration, left eye, intermediate dry stage: Secondary | ICD-10-CM | POA: Diagnosis not present

## 2022-09-11 DIAGNOSIS — H35372 Puckering of macula, left eye: Secondary | ICD-10-CM | POA: Diagnosis not present

## 2022-09-11 DIAGNOSIS — H35033 Hypertensive retinopathy, bilateral: Secondary | ICD-10-CM

## 2022-09-11 DIAGNOSIS — I10 Essential (primary) hypertension: Secondary | ICD-10-CM | POA: Diagnosis not present

## 2022-09-11 DIAGNOSIS — H25813 Combined forms of age-related cataract, bilateral: Secondary | ICD-10-CM

## 2022-09-11 DIAGNOSIS — H353211 Exudative age-related macular degeneration, right eye, with active choroidal neovascularization: Secondary | ICD-10-CM

## 2022-09-11 MED ORDER — FARICIMAB-SVOA 6 MG/0.05ML IZ SOLN
6.0000 mg | INTRAVITREAL | Status: AC | PRN
Start: 2022-09-11 — End: 2022-09-11
  Administered 2022-09-11: 6 mg via INTRAVITREAL

## 2022-10-02 NOTE — Progress Notes (Signed)
[facility] - Clinic Note  10/09/2022    CHIEF COMPLAINT Patient presents for Retina Follow Up  HISTORY OF PRESENT ILLNESS: Kimberly Noble is a 77 y.o. female who presents to the clinic today for:   HPI     Retina Follow Up   Patient presents with  Wet AMD.  In right eye.  This started 4 weeks ago.  Duration of 4 weeks.  Since onset it is stable.  I, the attending physician,  performed the HPI with the patient and updated documentation appropriately.        Comments   4 week retina follow up ARMD and IVV  OD pt is reporting no vision changes noticed she denies any flashes or floaters       Last edited by Rennis Chris, MD on 10/09/2022 11:09 AM.     Pt states vision is stable  Referring physician: No referring provider defined for this encounter.  HISTORICAL INFORMATION:   Selected notes from the MEDICAL RECORD NUMBER Referred by Dr. Zenaida Niece for PED OD LEE:  Ocular Hx- PMH-    CURRENT MEDICATIONS: No current outpatient medications on file. (Ophthalmic Drugs)   No current facility-administered medications for this visit. (Ophthalmic Drugs)   Current Outpatient Medications (Other)  Medication Sig   ALPRAZolam (XANAX) 1 MG tablet Take 1 mg by mouth 3 (three) times daily. 7am, 1pm, 7pm   Ascorbic Acid (VITAMIN C PO) Take 1 capsule by mouth daily. supplement from [facility]   Biotin w/ Vitamins C & E (HAIR/SKIN/NAILS PO) Take 1 capsule by mouth daily. supplement from [facility]   CALCIUM CARBONATE-VITAMIN D PO Take 1 capsule by mouth daily. supplement from [facility]   Cholecalciferol (VITAMIN D3 PO) Take 1 capsule by mouth daily. supplement from [facility]   Coenzyme Q10 (COQ10 PO) Take 1 capsule by mouth daily.   CRANBERRY PO Take 1 capsule by mouth daily. supplement from [facility]   Multiple Vitamin (MULTIVITAMIN WITH MINERALS) TABS tablet Take 1 tablet by mouth daily. supplement from [facility]   Omega-3 Fatty Acids (FISH OIL PO) Take 1 capsule by mouth  daily. supplement from [facility]   OVER THE COUNTER MEDICATION Take 1 capsule by mouth daily. Joint support supplement from [facility]   OVER THE COUNTER MEDICATION Take 1 capsule by mouth daily. Cholesterol care supplement from [facility]   OVER THE COUNTER MEDICATION Take 1 capsule by mouth daily. Circulation and vein support supplement from [facility]   OVER THE COUNTER MEDICATION Take 1 capsule by mouth daily. Green vegetable supplement from [employer]   OVER THE COUNTER MEDICATION Take 1 capsule by mouth daily. Liver and brain supplement from [facility]   OVER THE COUNTER MEDICATION Take 1 capsule by mouth daily. Eye vitamin with lutein - supplement from [facility]   VITAMIN K PO Take 1 capsule by mouth daily. supplement from [facility]   No current facility-administered medications for this visit. (Other)   REVIEW OF SYSTEMS: ROS   Positive for: Neurological, Eyes Negative for: Constitutional, Gastrointestinal, Skin, Genitourinary, Musculoskeletal, HENT, Endocrine, Cardiovascular, Respiratory, Psychiatric, Allergic/Imm, Heme/Lymph Last edited by Etheleen Mayhew, COT on 10/09/2022  8:42 AM.     ALLERGIES Allergies  Allergen Reactions   Crab Extract Anaphylaxis    Eating crab causes severe allergy. Claims she is not allergic to all shellfish.    Penicillins Rash    Has patient had a PCN reaction causing immediate rash, facial/tongue/throat swelling,  SOB or lightheadedness with hypotension: Yes Has patient had a PCN reaction causing severe rash involving mucus membranes or skin necrosis: No Has patient had a PCN reaction that required hospitalization No Has patient had a PCN reaction occurring within the last 10 years: No If all of the above answers are "NO", then may proceed with Cephalosporin use.    PAST MEDICAL HISTORY Past Medical History:  Diagnosis Date   Agoraphobia with panic attacks    Anxiety attack    Major depression    Superficial laceration of hand ~ 03/2015   left  thumb   Past Surgical History:  Procedure Laterality Date   TONSILLECTOMY  1950's   FAMILY HISTORY Family History  Problem Relation Age of Onset   Macular degeneration Mother    SOCIAL HISTORY Social History   Tobacco Use   Smoking status: Every Day    Packs/day: 0.33    Years: 40.00    Additional pack years: 0.00    Total pack years: 13.20    Types: Cigarettes   Smokeless tobacco: Never  Vaping Use   Vaping Use: Never used  Substance Use Topics   Alcohol use: No   Drug use: No       OPHTHALMIC EXAM:  Base Eye Exam     Visual Acuity (Snellen - Linear)       Right Left   Dist cc 20/60 -2 20/40 -2   Dist ph cc 20/50 -1 NI    Correction: Glasses         Tonometry (Tonopen, 8:47 AM)       Right Left   Pressure 20 21         Pupils       Pupils Dark Light Shape React APD   Right PERRL 4 3 Round Brisk None   Left PERRL 4 3 Round Brisk None         Visual Fields       Left Right    Full Full         Extraocular Movement       Right Left    Full, Ortho Full, Ortho         Neuro/Psych     Oriented x3: Yes   Mood/Affect: Normal         Dilation     Both eyes: 2.5% Phenylephrine @ 8:47 AM           Slit Lamp and Fundus Exam     Slit Lamp Exam       Right Left   Lids/Lashes Dermatochalasis - upper lid, mild MGD Dermatochalasis - upper lid, mild MGD   Conjunctiva/Sclera White and quiet White and quiet   Cornea mild arcus, 1+ inferior Punctate epithelial erosions, trace tear film debris mild arcus, 1+ inferior Punctate epithelial erosions, trace tear film debris   Anterior Chamber Deep and quiet Deep and quiet   Iris Round and dilated Round and dilated   Lens 3+ Nuclear sclerosis with brunescence, 3+ Cortical cataract 3+ Nuclear sclerosis, 3+ Cortical cataract   Anterior Vitreous Mild syneresis Mild syneresis, Posterior vitreous detachment, vitreous condensations         Fundus Exam       Right Left   Disc Pink and  Sharp, temporal PPA, Compact Pink and Sharp, temporal PPA, Compact   C/D Ratio 0.5 0.5   Macula Blunted foveal reflex, +CNV with pigmented RPE rip and surrounding atrophy, interval improvement in shallow SRF, no frank heme Flat, Blunted  foveal reflex, fine drusen, RPE mottling and clumping, No heme or edema   Vessels attenuated, mild tortuosity attenuated, Tortuous   Periphery Attached, reticular degeneration, focal pigment clumping IT, No heme Attached, reticular degeneration, focal pigment clumping IT, No heme           Refraction     Wearing Rx       Sphere Cylinder Axis Add   Right -6.00 +1.75 117 +2.50   Left -5.25 +2.25 085 +2.50           IMAGING AND PROCEDURES  Imaging and Procedures for 10/09/2022  OCT, Retina - OU - Both Eyes       Right Eye Quality was good. Central Foveal Thickness: 445. Progression has improved. Findings include no IRF, no SRF, abnormal foveal contour, subretinal hyper-reflective material, pigment epithelial detachment, vitreomacular adhesion (stable improvement in IRF, interval improvement in central PED and surrounding SRF, partial PVD).   Left Eye Quality was good. Central Foveal Thickness: 264. Progression has been stable. Findings include normal foveal contour, no IRF, no SRF, retinal drusen , epiretinal membrane, macular pucker (Very mild drusen, mild ERM with early pucker SN mac).   Notes *Images captured and stored on drive  Diagnosis / Impression:  OD: exu ARMD - stable improvement in IRF, interval improvement in central PED and surrounding SRF, partial PVD OS: non-exu ARMD - Very mild drusen, mild ERM with early pucker SN mac  Clinical management:  See below  Abbreviations: NFP - Normal foveal profile. CME - cystoid macular edema. PED - pigment epithelial detachment. IRF - intraretinal fluid. SRF - subretinal fluid. EZ - ellipsoid zone. ERM - epiretinal membrane. ORA - outer retinal atrophy. ORT - outer retinal tubulation. SRHM -  subretinal hyper-reflective material. IRHM - intraretinal hyper-reflective material      Intravitreal Injection, Pharmacologic Agent - OD - Right Eye       Time Out 10/09/2022. 9:26 AM. Confirmed correct patient, procedure, site, and patient consented.   Anesthesia Topical anesthesia was used. Anesthetic medications included Lidocaine 2%, Proparacaine 0.5%.   Procedure Preparation included 5% betadine to ocular surface, eyelid speculum. A (32g) needle was used.   Injection: 6 mg faricimab-svoa 6 MG/0.05ML   Route: Intravitreal, Site: Right Eye   NDC: O8010301, Lot: [id number], Expiration date: 08/11/2024, Waste: 0 mL   Post-op Post injection exam found visual acuity of at least counting fingers. The patient tolerated the procedure well. There were no complications. The patient received written and verbal post procedure care education. Post injection medications were not given.            ASSESSMENT/PLAN:    ICD-10-CM   1. Exudative age-related macular degeneration of right eye with active choroidal neovascularization (HCC)  H35.3211 OCT, Retina - OU - Both Eyes    Intravitreal Injection, Pharmacologic Agent - OD - Right Eye    faricimab-svoa (VABYSMO) 6mg /0.8mL intravitreal injection    2. 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 Epiretinal membrane (ERM) of left eye  H35.372     4. Essential hypertension  I10     5. Hypertensive retinopathy of both eyes  H35.033     6. Combined forms of age-related cataract of both eyes  H25.813      1. 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, right eye  - s/p IVA OD #1 (03.16.23), #2 (04.13.23), #3 (05.11.23), #4 (06.08.23) -- IVA resistance  - s/p IVE OD #1 (07.06.23), #2 (08.03.23), #3 (08.31.23), #  4 (10.05.23) -- IVE resistance  - s/p IVV OD #1 (11.09.23), #2 (12.07.23), #3 (01.04.24), #4 (02.01.24), #5 (02.29.24), #6 (03.15.24), #7 (04.25.24), #8 (05.30.24) **history of  increased IRF at 5 weeks, noted on 05.30.24 exam** - pt presented initially on 3.16.23 with 2 wk history of central vision loss and distortion  - initial exam and OCT OD stable improvement in IRF, interval improvement in central PED and surrounding SRF, partial PVD - BCVA OD stable at 20/50  - OCT shows OD: stable improvement in IRF, interval improvement in central PED and surrounding SRF, partial PVD  at 4 weeks - recommend IVV OD #9 today, 06.27.24 w/ f/u at 4 wks - pt wishes to proceed with injection - RBA of procedure discussed, questions answered - IVV informed consent obtained and signed, 11.09.23 (OD) - see procedure note  - f/u 4 weeks, DFE, OCT, possible injection  2. Age related macular degeneration, non-exudative, left eye  - mild/intermediate stage with mild drusen  - The incidence, anatomy, and pathology of dry AMD, risk of progression, and the AREDS and AREDS 2 study including smoking risks discussed with patient.   - Recommend amsler grid monitoring  3. Epiretinal membrane, left eye  - mild ERM OS w/ early pucker, SN macula - BCVA 20/40 - asymptomatic, no metamorphopsia - no indication for surgery at this time - monitor for now  4,5. Hypertensive retinopathy OU - discussed importance of tight BP control - monitor   6. Mixed Cataract OU - The symptoms of cataract, surgical options, and treatments and risks were discussed with patient.  - discussed diagnosis and progression - referred to Dr. Delaney Meigs for consult -- pt had appt on April 23rd, but had to reschedule -- does not have appt yet  Ophthalmic Meds Ordered this visit:  Meds ordered this encounter  Medications   faricimab-svoa (VABYSMO) 6mg /0.28mL intravitreal injection     Return in about 4 weeks (around 11/06/2022) for f/u exu ARMD OD, DFE, OCT.  There are no Patient Instructions on file for this visit.   Explained the diagnoses, plan, and follow up with the patient and they expressed understanding.   Patient expressed understanding of the importance of proper follow up care.   This document serves as a record of services personally performed by Karie Chimera, MD, PhD. It was created on their behalf by Glee Arvin. Manson Passey, OA an ophthalmic technician. The creation of this record is the provider's dictation and/or activities during the visit.    Electronically signed by: Glee Arvin. [place], [place] 02.7253 4:40 PM   Karie Chimera, M.D., Ph.D. Diseases & Surgery of the Retina and Vitreous [facility]  I have reviewed the above documentation for accuracy and completeness, and I agree with the above. Karie Chimera, M.D., Ph.D. 10/09/22 4:46 PM  Abbreviations: M myopia (nearsighted); A astigmatism; H hyperopia (farsighted); P presbyopia; Mrx spectacle prescription;  CTL contact lenses; OD right eye; OS left eye; OU both eyes  XT exotropia; ET esotropia; PEK punctate epithelial keratitis; PEE punctate epithelial erosions; DES dry eye syndrome; MGD meibomian gland dysfunction; ATs artificial tears; PFAT's preservative free artificial tears; NSC nuclear sclerotic cataract; PSC posterior subcapsular cataract; ERM epi-retinal membrane; PVD posterior vitreous detachment; RD retinal detachment; DM diabetes mellitus; DR diabetic retinopathy; NPDR non-proliferative diabetic retinopathy; PDR proliferative diabetic retinopathy; CSME clinically significant macular edema; DME diabetic macular edema; dbh dot blot hemorrhages; CWS cotton wool spot; POAG primary open angle glaucoma; C/D cup-to-disc ratio; HVF humphrey  visual field; GVF goldmann visual field; OCT optical coherence tomography; IOP intraocular pressure; BRVO Branch retinal vein occlusion; CRVO central retinal vein occlusion; CRAO central retinal artery occlusion; BRAO branch retinal artery occlusion; RT retinal tear; SB scleral buckle; PPV pars plana vitrectomy; VH Vitreous hemorrhage; PRP panretinal laser photocoagulation; IVK  intravitreal kenalog; VMT vitreomacular traction; MH Macular hole;  NVD neovascularization of the disc; NVE neovascularization elsewhere; AREDS age related eye disease study; ARMD age related macular degeneration; POAG primary open angle glaucoma; EBMD epithelial/anterior basement membrane dystrophy; ACIOL anterior chamber intraocular lens; IOL intraocular lens; PCIOL posterior chamber intraocular lens; Phaco/IOL phacoemulsification with intraocular lens placement; Lewis and [employer] photorefractive keratectomy; LASIK laser assisted in situ keratomileusis; HTN hypertension; DM diabetes mellitus; COPD chronic obstructive pulmonary disease

## 2022-10-09 ENCOUNTER — Ambulatory Visit (INDEPENDENT_AMBULATORY_CARE_PROVIDER_SITE_OTHER): Payer: Medicare Other | Admitting: Ophthalmology

## 2022-10-09 ENCOUNTER — Encounter (INDEPENDENT_AMBULATORY_CARE_PROVIDER_SITE_OTHER): Payer: Self-pay | Admitting: Ophthalmology

## 2022-10-09 DIAGNOSIS — H353211 Exudative age-related macular degeneration, right eye, with active choroidal neovascularization: Secondary | ICD-10-CM | POA: Diagnosis not present

## 2022-10-09 DIAGNOSIS — H35372 Puckering of macula, left eye: Secondary | ICD-10-CM | POA: Diagnosis not present

## 2022-10-09 DIAGNOSIS — H25813 Combined forms of age-related cataract, bilateral: Secondary | ICD-10-CM

## 2022-10-09 DIAGNOSIS — H35033 Hypertensive retinopathy, bilateral: Secondary | ICD-10-CM

## 2022-10-09 DIAGNOSIS — H353122 Nonexudative age-related macular degeneration, left eye, intermediate dry stage: Secondary | ICD-10-CM | POA: Diagnosis not present

## 2022-10-09 DIAGNOSIS — I10 Essential (primary) hypertension: Secondary | ICD-10-CM | POA: Diagnosis not present

## 2022-10-09 MED ORDER — FARICIMAB-SVOA 6 MG/0.05ML IZ SOLN
6.0000 mg | INTRAVITREAL | Status: AC | PRN
Start: 2022-10-09 — End: 2022-10-09
  Administered 2022-10-09: 6 mg via INTRAVITREAL

## 2022-10-24 NOTE — Progress Notes (Signed)
[facility] - Clinic Note  11/06/2022    CHIEF COMPLAINT Patient presents for Retina Follow Up  HISTORY OF PRESENT ILLNESS: Kimberly Noble is a 77 y.o. female who presents to the clinic today for:   HPI     Retina Follow Up   Patient presents with  Wet AMD.  In both eyes.  This started 4 weeks ago.  Duration of 4.  Since onset it is stable.  I, the attending physician,  performed the HPI with the patient and updated documentation appropriately.        Comments   4 week retina follow up ARMD OD and IVV OD pt is reporting no vision changes noticed she denies any flashes or floaters       Last edited by Rennis Chris, MD on 11/06/2022 12:16 PM.    Pt states vision is stable  Referring physician: No referring provider defined for this encounter.  HISTORICAL INFORMATION:   Selected notes from the MEDICAL RECORD NUMBER Referred by Dr. Zenaida Niece for PED OD LEE:  Ocular Hx- PMH-    CURRENT MEDICATIONS: No current outpatient medications on file. (Ophthalmic Drugs)   No current facility-administered medications for this visit. (Ophthalmic Drugs)   Current Outpatient Medications (Other)  Medication Sig   ALPRAZolam (XANAX) 1 MG tablet Take 1 mg by mouth 3 (three) times daily. 7am, 1pm, 7pm   Ascorbic Acid (VITAMIN C PO) Take 1 capsule by mouth daily. supplement from [facility]   Biotin w/ Vitamins C & E (HAIR/SKIN/NAILS PO) Take 1 capsule by mouth daily. supplement from [facility]   CALCIUM CARBONATE-VITAMIN D PO Take 1 capsule by mouth daily. supplement from [facility]   Cholecalciferol (VITAMIN D3 PO) Take 1 capsule by mouth daily. supplement from [facility]   Coenzyme Q10 (COQ10 PO) Take 1 capsule by mouth daily.   CRANBERRY PO Take 1 capsule by mouth daily. supplement from [facility]   Multiple Vitamin (MULTIVITAMIN WITH MINERALS) TABS tablet Take 1 tablet by mouth daily. supplement from [facility]   Omega-3 Fatty Acids (FISH OIL PO) Take 1 capsule by mouth daily.  supplement from [facility]   OVER THE COUNTER MEDICATION Take 1 capsule by mouth daily. Joint support supplement from [facility]   OVER THE COUNTER MEDICATION Take 1 capsule by mouth daily. Cholesterol care supplement from [facility]   OVER THE COUNTER MEDICATION Take 1 capsule by mouth daily. Circulation and vein support supplement from [facility]   OVER THE COUNTER MEDICATION Take 1 capsule by mouth daily. Green vegetable supplement from [employer]   OVER THE COUNTER MEDICATION Take 1 capsule by mouth daily. Liver and brain supplement from [facility]   OVER THE COUNTER MEDICATION Take 1 capsule by mouth daily. Eye vitamin with lutein - supplement from [facility]   VITAMIN K PO Take 1 capsule by mouth daily. supplement from [facility]   No current facility-administered medications for this visit. (Other)   REVIEW OF SYSTEMS: ROS   Positive for: Neurological, Eyes Negative for: Constitutional, Gastrointestinal, Skin, Genitourinary, Musculoskeletal, HENT, Endocrine, Cardiovascular, Respiratory, Psychiatric, Allergic/Imm, Heme/Lymph Last edited by Etheleen Mayhew, COT on 11/06/2022  8:28 AM.     ALLERGIES Allergies  Allergen Reactions   Crab Extract Anaphylaxis    Eating crab causes severe allergy. Claims she is not allergic to all shellfish.    Penicillins Rash    Has patient had a PCN reaction causing immediate rash, facial/tongue/throat swelling, SOB or  lightheadedness with hypotension: Yes Has patient had a PCN reaction causing severe rash involving mucus membranes or skin necrosis: No Has patient had a PCN reaction that required hospitalization No Has patient had a PCN reaction occurring within the last 10 years: No If all of the above answers are "NO", then may proceed with Cephalosporin use.    PAST MEDICAL HISTORY Past Medical History:  Diagnosis Date   Agoraphobia with panic attacks    Anxiety attack    Major depression    Superficial laceration of hand ~ 03/2015   left thumb    Past Surgical History:  Procedure Laterality Date   TONSILLECTOMY  1950's   FAMILY HISTORY Family History  Problem Relation Age of Onset   Macular degeneration Mother    SOCIAL HISTORY Social History   Tobacco Use   Smoking status: Every Day    Current packs/day: 0.33    Average packs/day: 0.3 packs/day for 40.0 years (13.2 ttl pk-yrs)    Types: Cigarettes   Smokeless tobacco: Never  Vaping Use   Vaping status: Never Used  Substance Use Topics   Alcohol use: No   Drug use: No       OPHTHALMIC EXAM:  Base Eye Exam     Visual Acuity (Snellen - Linear)       Right Left   Dist cc 20/50 -1 20/40 -3   Dist ph cc NI NI    Correction: Glasses         Tonometry (Tonopen, 8:32 AM)       Right Left   Pressure 20 20         Pupils       Pupils Dark Light Shape React APD   Right PERRL 4 3 Round Brisk None   Left PERRL 4 3 Round Brisk None         Visual Fields       Left Right    Full Full         Extraocular Movement       Right Left    Full, Ortho Full, Ortho         Neuro/Psych     Oriented x3: Yes   Mood/Affect: Normal         Dilation     Both eyes: 2.5% Phenylephrine @ 8:33 AM           Slit Lamp and Fundus Exam     Slit Lamp Exam       Right Left   Lids/Lashes Dermatochalasis - upper lid, mild MGD Dermatochalasis - upper lid, mild MGD   Conjunctiva/Sclera White and quiet White and quiet   Cornea mild arcus, 1+ inferior Punctate epithelial erosions, trace tear film debris mild arcus, 1+ inferior Punctate epithelial erosions, trace tear film debris   Anterior Chamber Deep and quiet Deep and quiet   Iris Round and dilated Round and dilated   Lens 3+ Nuclear sclerosis with brunescence, 3+ Cortical cataract 3+ Nuclear sclerosis, 3+ Cortical cataract   Anterior Vitreous Mild syneresis Mild syneresis, Posterior vitreous detachment, vitreous condensations         Fundus Exam       Right Left   Disc Pink and Sharp,  temporal PPA, Compact Pink and Sharp, temporal PPA, Compact   C/D Ratio 0.5 0.5   Macula Blunted foveal reflex, +CNV with pigmented RPE rip and surrounding atrophy, stable improvement in shallow SRF, no frank heme Flat, Blunted foveal reflex, fine drusen, RPE mottling and clumping,  No heme or edema   Vessels attenuated, mild tortuosity attenuated, Tortuous   Periphery Attached, reticular degeneration, focal pigment clumping IT, No heme Attached, reticular degeneration, focal pigment clumping IT, No heme           Refraction     Wearing Rx       Sphere Cylinder Axis Add   Right -6.00 +1.75 117 +2.50   Left -5.25 +2.25 085 +2.50           IMAGING AND PROCEDURES  Imaging and Procedures for 11/06/2022  OCT, Retina - OU - Both Eyes       Right Eye Quality was good. Central Foveal Thickness: 430. Progression has been stable. Findings include no IRF, no SRF, abnormal foveal contour, subretinal hyper-reflective material, pigment epithelial detachment, vitreomacular adhesion (stable improvement in IRF, persistent central PED with trace pockets of SRF temporal to PED, partial PVD).   Left Eye Quality was good. Central Foveal Thickness: 256. Progression has been stable. Findings include normal foveal contour, no IRF, no SRF, retinal drusen , epiretinal membrane, macular pucker (Very mild drusen, mild ERM with early pucker SN mac).   Notes *Images captured and stored on drive  Diagnosis / Impression:  OD: exu ARMD - stable improvement in IRF, persistent central PED with trace pockets of SRF temporal to PED, partial PVD OS: non-exu ARMD - Very mild drusen, mild ERM with early pucker SN mac  Clinical management:  See below  Abbreviations: NFP - Normal foveal profile. CME - cystoid macular edema. PED - pigment epithelial detachment. IRF - intraretinal fluid. SRF - subretinal fluid. EZ - ellipsoid zone. ERM - epiretinal membrane. ORA - outer retinal atrophy. ORT - outer retinal  tubulation. SRHM - subretinal hyper-reflective material. IRHM - intraretinal hyper-reflective material      Intravitreal Injection, Pharmacologic Agent - OD - Right Eye       Time Out 11/06/2022. 9:29 AM. Confirmed correct patient, procedure, site, and patient consented.   Anesthesia Topical anesthesia was used. Anesthetic medications included Lidocaine 2%, Proparacaine 0.5%.   Procedure Preparation included 5% betadine to ocular surface, eyelid speculum. A (32g) needle was used.   Injection: 6 mg faricimab-svoa 6 MG/0.05ML   Route: Intravitreal, Site: Right Eye   NDC: O8010301, Lot: [id number], Expiration date: 12/12/2024, Waste: 0 mL   Post-op Post injection exam found visual acuity of at least counting fingers. The patient tolerated the procedure well. There were no complications. The patient received written and verbal post procedure care education. Post injection medications were not given.            ASSESSMENT/PLAN:    ICD-10-CM   1. Exudative age-related macular degeneration of right eye with active choroidal neovascularization (HCC)  H35.3211 OCT, Retina - OU - Both Eyes    Intravitreal Injection, Pharmacologic Agent - OD - Right Eye    faricimab-svoa (VABYSMO) 6mg /0.38mL intravitreal injection    2. 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 Epiretinal membrane (ERM) of left eye  H35.372     4. Essential hypertension  I10     5. Hypertensive retinopathy of both eyes  H35.033     6. Combined forms of age-related cataract of both eyes  H25.813      1. 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, right eye  - s/p IVA OD #1 (03.16.23), #2 (04.13.23), #3 (05.11.23), #4 (06.08.23) -- IVA resistance  - s/p IVE OD #1 (07.06.23), #2 (08.03.23), #3 (08.31.23), #4 (  10.05.23) -- IVE resistance  - s/p IVV OD #1 (11.09.23), #2 (12.07.23), #3 (01.04.24), #4 (02.01.24), #5 (02.29.24), #6 (03.15.24), #7 (04.25.24), #8  (05.30.24), #9 (06.27.24) **history of increased IRF at 5 weeks, noted on 05.30.24 exam** - pt presented initially on 3.16.23 with 2 wk history of central vision loss and distortion  - initial exam and OCT OD stable improvement in IRF, interval improvement in central PED and surrounding SRF, partial PVD - BCVA OD stable at 20/50  - OCT shows stable improvement in IRF, persistent central PED with trace pockets of SRF temporal to PED, partial PVD at 4 weeks - recommend IVV OD #10 today, 07.25.24 w/ f/u at 4 wks - pt wishes to proceed with injection - RBA of procedure discussed, questions answered - IVV informed consent obtained and signed, 11.09.23 (OD) - see procedure note  - f/u 4 weeks, DFE, OCT, possible injection  2. Age related macular degeneration, non-exudative, left eye  - mild/intermediate stage with mild drusen  - The incidence, anatomy, and pathology of dry AMD, risk of progression, and the AREDS and AREDS 2 study including smoking risks discussed with patient.   - Recommend amsler grid monitoring  3. Epiretinal membrane, left eye  - mild ERM OS w/ early pucker, SN macula - BCVA 20/40 - asymptomatic, no metamorphopsia - no indication for surgery at this time - monitor for now  4,5. Hypertensive retinopathy OU - discussed importance of tight BP control - monitor   6. Mixed Cataract OU - The symptoms of cataract, surgical options, and treatments and risks were discussed with patient.  - discussed diagnosis and progression - referred to Dr. Delaney Meigs for consult -- pt had appt on April 23rd, but had to cancel -- does not have appt yet  Ophthalmic Meds Ordered this visit:  Meds ordered this encounter  Medications   faricimab-svoa (VABYSMO) 6mg /0.68mL intravitreal injection     Return in about 4 weeks (around 12/04/2022) for f/u exu ARMD OD, DFE, OCT.  There are no Patient Instructions on file for this visit.   Explained the diagnoses, plan, and follow up with the  patient and they expressed understanding.  Patient expressed understanding of the importance of proper follow up care.   This document serves as a record of services personally performed by Karie Chimera, MD, PhD. It was created on their behalf by Glee Arvin. Manson Passey, OA an ophthalmic technician. The creation of this record is the provider's dictation and/or activities during the visit.    Electronically signed by: Glee Arvin. Manson Passey, OA 11/06/22 9:34 PM  Karie Chimera, M.D., Ph.D. Diseases & Surgery of the Retina and Vitreous [facility]  I have reviewed the above documentation for accuracy and completeness, and I agree with the above. Karie Chimera, M.D., Ph.D. 11/06/22 9:36 PM   Abbreviations: M myopia (nearsighted); A astigmatism; H hyperopia (farsighted); P presbyopia; Mrx spectacle prescription;  CTL contact lenses; OD right eye; OS left eye; OU both eyes  XT exotropia; ET esotropia; PEK punctate epithelial keratitis; PEE punctate epithelial erosions; DES dry eye syndrome; MGD meibomian gland dysfunction; ATs artificial tears; PFAT's preservative free artificial tears; NSC nuclear sclerotic cataract; PSC posterior subcapsular cataract; ERM epi-retinal membrane; PVD posterior vitreous detachment; RD retinal detachment; DM diabetes mellitus; DR diabetic retinopathy; NPDR non-proliferative diabetic retinopathy; PDR proliferative diabetic retinopathy; CSME clinically significant macular edema; DME diabetic macular edema; dbh dot blot hemorrhages; CWS cotton wool spot; POAG primary open angle glaucoma; C/D cup-to-disc ratio;  HVF humphrey visual field; GVF goldmann visual field; OCT optical coherence tomography; IOP intraocular pressure; BRVO Branch retinal vein occlusion; CRVO central retinal vein occlusion; CRAO central retinal artery occlusion; BRAO branch retinal artery occlusion; RT retinal tear; SB scleral buckle; PPV pars plana vitrectomy; VH Vitreous hemorrhage; PRP  panretinal laser photocoagulation; IVK intravitreal kenalog; VMT vitreomacular traction; MH Macular hole;  NVD neovascularization of the disc; NVE neovascularization elsewhere; AREDS age related eye disease study; ARMD age related macular degeneration; POAG primary open angle glaucoma; EBMD epithelial/anterior basement membrane dystrophy; ACIOL anterior chamber intraocular lens; IOL intraocular lens; PCIOL posterior chamber intraocular lens; Phaco/IOL phacoemulsification with intraocular lens placement; PRK photorefractive keratectomy; LASIK laser assisted in situ keratomileusis; HTN hypertension; DM diabetes mellitus; COPD chronic obstructive pulmonary disease

## 2022-10-28 IMAGING — CT CT MAXILLOFACIAL W/O CM
3 of 5 series · 16 of 47 positions shown, 19 images · non-contrast
Comparison: CT head 06/10/2021

CLINICAL DATA: Facial trauma, blunt



[Series 3: max soft (person_name) · axial · 0.36mm/px · z∈[+1314,+1448]mm · 12 of 79 slices shown, 15 images]
[im 6/79  brain]
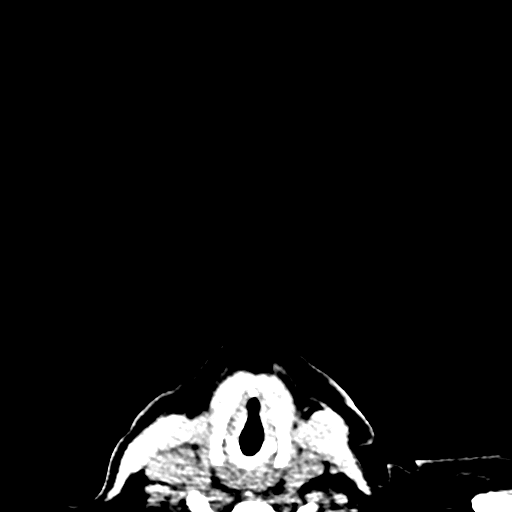
[im 6/79  bone]
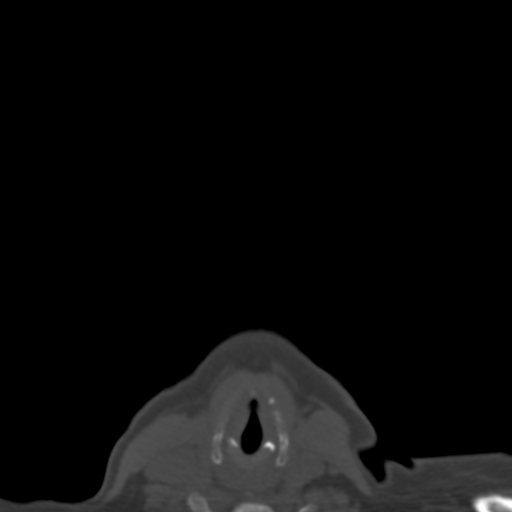
[im 11/79  bone]
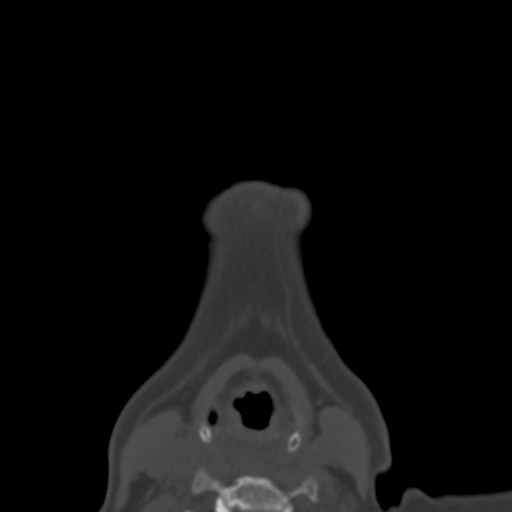
[im 17/79  bone]
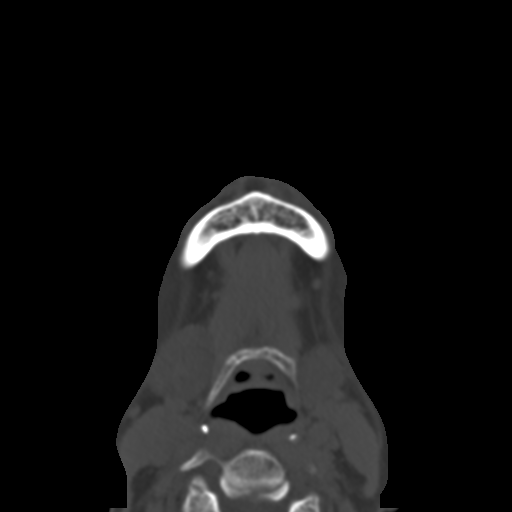
[im 25/79  bone]
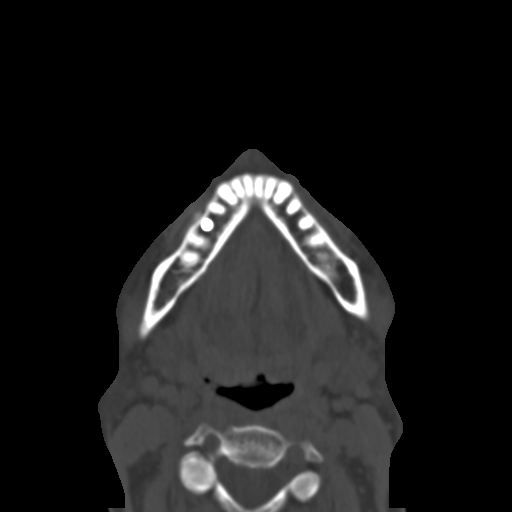
[im 30/79  brain]
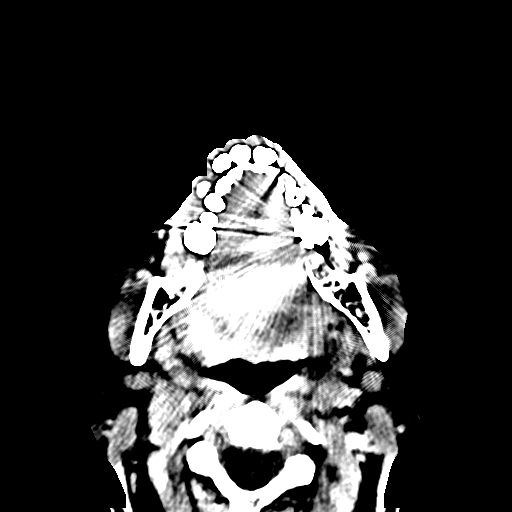
[im 30/79  bone]
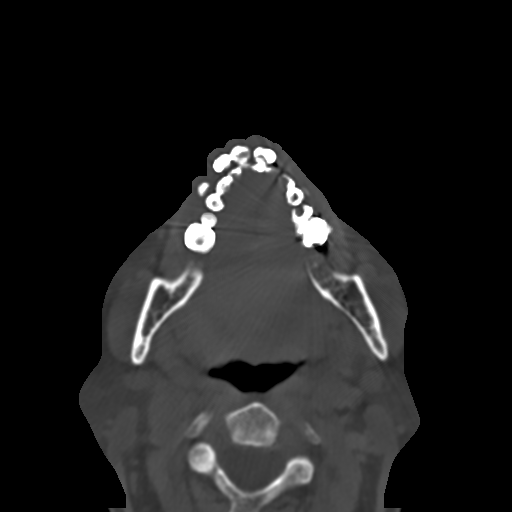
[im 35/79  bone]
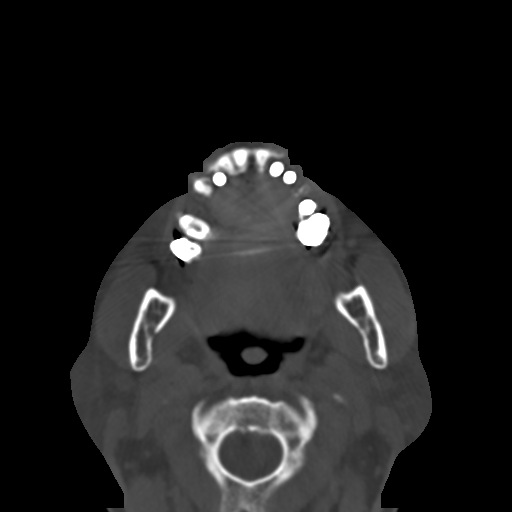
[im 44/79  bone]
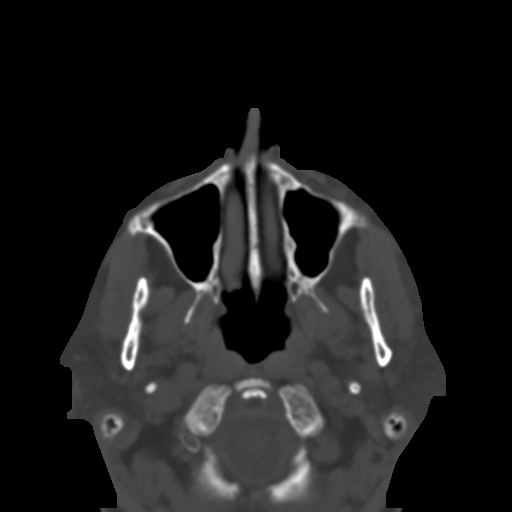
[im 49/79  bone]
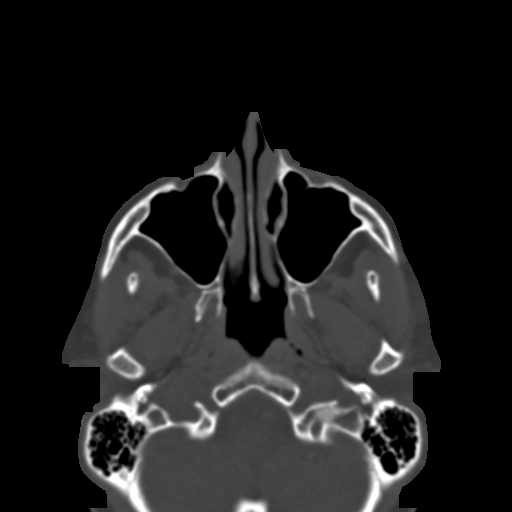
[im 54/79  brain]
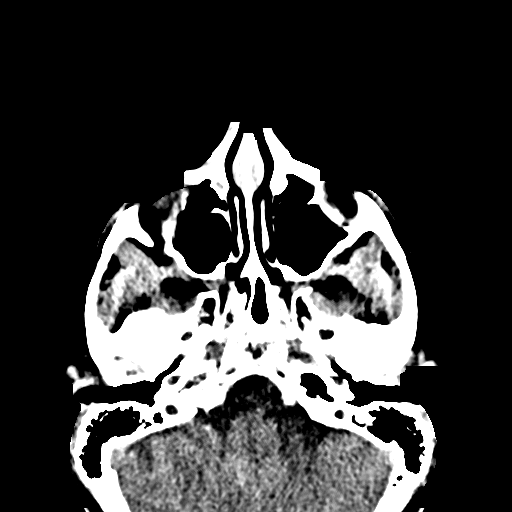
[im 54/79  bone]
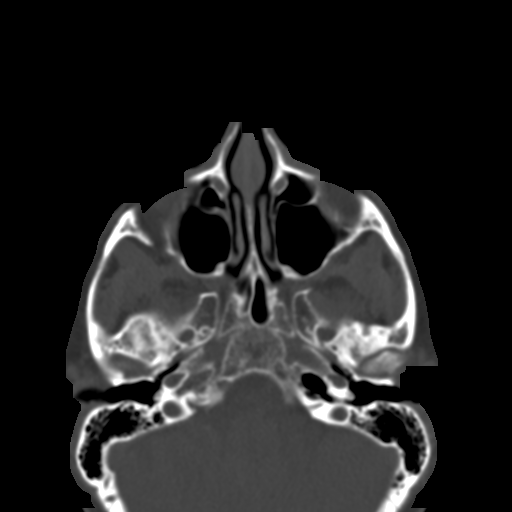
[im 62/79  bone]
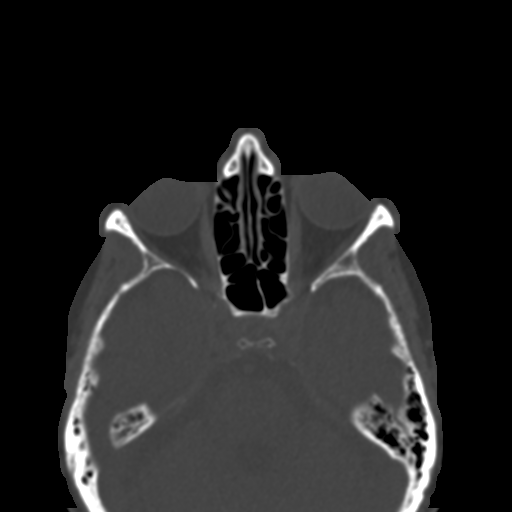
[im 68/79  bone]
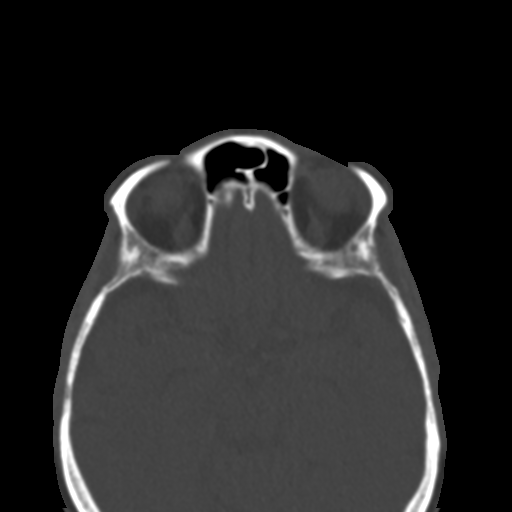
[im 73/79  bone]
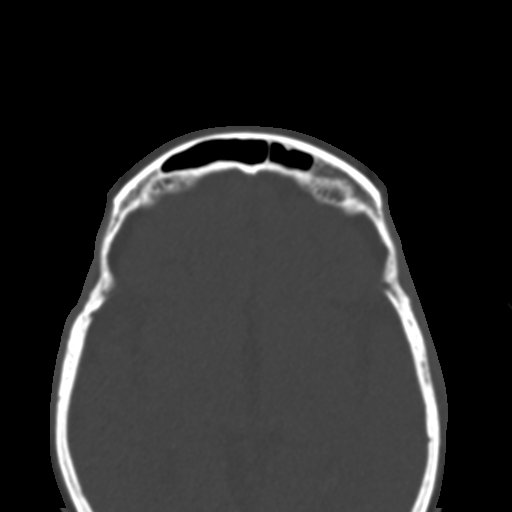

[Series 7: coronal bone · coronal · 0.36mm/px · 3 of 85 slices shown]
[im 22/85  bone]
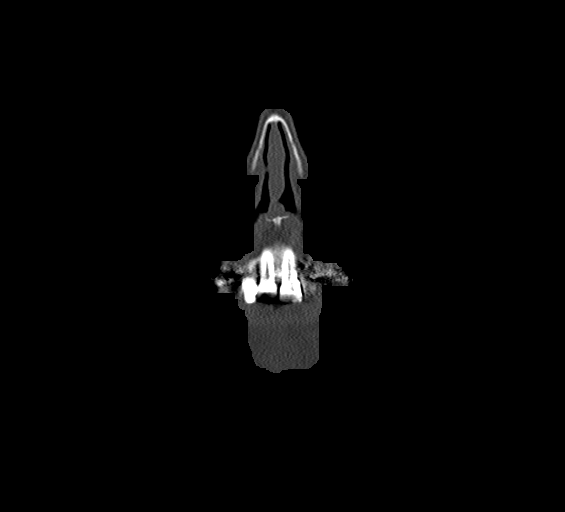
[im 43/85  bone]
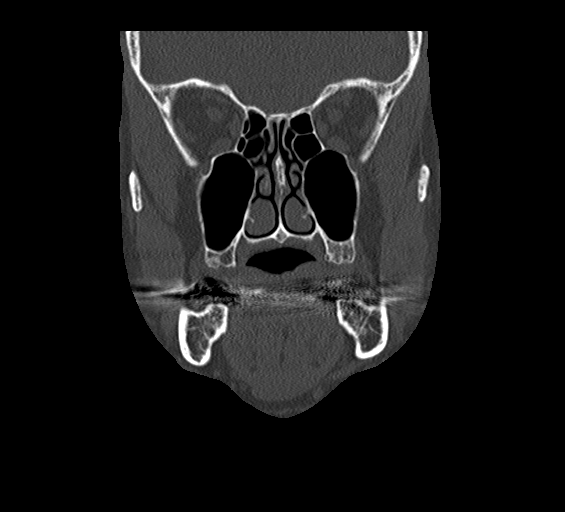
[im 64/85  bone]
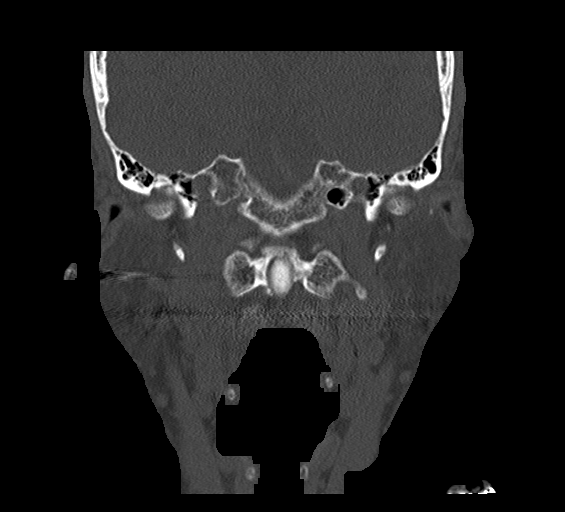

[Series 8: sagittal bone · sagittal · 0.36mm/px · 1 of 73 slices shown]
[im 37/73  bone]
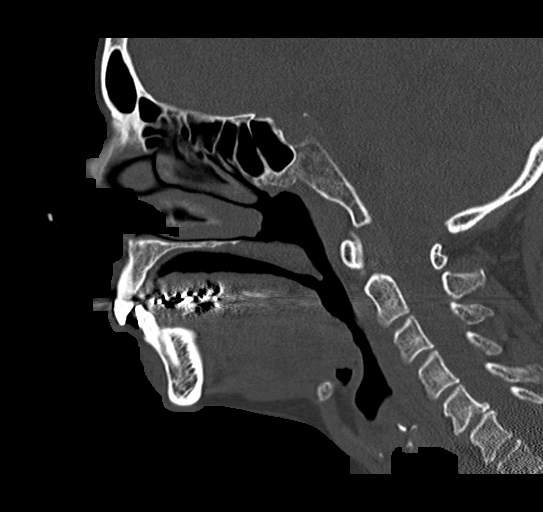

[16 of 47 positions shown; findings below may reference images not displayed]

FINDINGS: Osseous: No fracture or mandibular dislocation. No destructive
process.

Orbits: Negative. No traumatic or inflammatory finding.

Sinuses: Clear.

Soft tissues: Negative.

Limited intracranial: No significant or unexpected finding.

Other: Visualized upper cervical spine demonstrates no acute
displaced fracture or traumatic listhesis.
IMPRESSION: No acute facial fracture.

## 2022-11-06 ENCOUNTER — Encounter (INDEPENDENT_AMBULATORY_CARE_PROVIDER_SITE_OTHER): Payer: Self-pay | Admitting: Ophthalmology

## 2022-11-06 ENCOUNTER — Ambulatory Visit (INDEPENDENT_AMBULATORY_CARE_PROVIDER_SITE_OTHER): Payer: Medicare Other | Admitting: Ophthalmology

## 2022-11-06 DIAGNOSIS — H353211 Exudative age-related macular degeneration, right eye, with active choroidal neovascularization: Secondary | ICD-10-CM

## 2022-11-06 DIAGNOSIS — H353122 Nonexudative age-related macular degeneration, left eye, intermediate dry stage: Secondary | ICD-10-CM | POA: Diagnosis not present

## 2022-11-06 DIAGNOSIS — I10 Essential (primary) hypertension: Secondary | ICD-10-CM | POA: Diagnosis not present

## 2022-11-06 DIAGNOSIS — H35372 Puckering of macula, left eye: Secondary | ICD-10-CM

## 2022-11-06 DIAGNOSIS — H25813 Combined forms of age-related cataract, bilateral: Secondary | ICD-10-CM

## 2022-11-06 DIAGNOSIS — H35033 Hypertensive retinopathy, bilateral: Secondary | ICD-10-CM

## 2022-11-06 MED ORDER — FARICIMAB-SVOA 6 MG/0.05ML IZ SOLN
6.0000 mg | INTRAVITREAL | Status: AC | PRN
Start: 2022-11-06 — End: 2022-11-06
  Administered 2022-11-06: 6 mg via INTRAVITREAL

## 2022-11-24 NOTE — Progress Notes (Signed)
[facility] - Clinic Note  12/04/2022    CHIEF COMPLAINT Patient presents for Retina Follow Up  HISTORY OF PRESENT ILLNESS: Kimberly Noble is a 77 y.o. female who presents to the clinic today for:   HPI     Retina Follow Up   Patient presents with  Wet AMD.  In right eye.  Severity is moderate.  Duration of 4 weeks.  Since onset it is stable.  I, the attending physician,  performed the HPI with the patient and updated documentation appropriately.        Comments   Pt here for 4 wk ret f/u exu ARMD OD. Pt states VA is the same, no changes.       Last edited by Rennis Chris, MD on 12/04/2022 10:37 AM.    Pt states vision is stable, pt is using AT's once or twice a day, she states her eyes don't feel dry  Referring physician: No referring provider defined for this encounter.  HISTORICAL INFORMATION:   Selected notes from the MEDICAL RECORD NUMBER Referred by Dr. Zenaida Niece for PED OD LEE:  Ocular Hx- PMH-    CURRENT MEDICATIONS: No current outpatient medications on file. (Ophthalmic Drugs)   No current facility-administered medications for this visit. (Ophthalmic Drugs)   Current Outpatient Medications (Other)  Medication Sig   ALPRAZolam (XANAX) 1 MG tablet Take 1 mg by mouth 3 (three) times daily. 7am, 1pm, 7pm   Ascorbic Acid (VITAMIN C PO) Take 1 capsule by mouth daily. supplement from [facility]   Biotin w/ Vitamins C & E (HAIR/SKIN/NAILS PO) Take 1 capsule by mouth daily. supplement from [facility]   CALCIUM CARBONATE-VITAMIN D PO Take 1 capsule by mouth daily. supplement from [facility]   Cholecalciferol (VITAMIN D3 PO) Take 1 capsule by mouth daily. supplement from [facility]   Coenzyme Q10 (COQ10 PO) Take 1 capsule by mouth daily.   CRANBERRY PO Take 1 capsule by mouth daily. supplement from [facility]   Multiple Vitamin (MULTIVITAMIN WITH MINERALS) TABS tablet Take 1 tablet by mouth daily. supplement from [facility]   Omega-3 Fatty Acids (FISH OIL PO)  Take 1 capsule by mouth daily. supplement from [facility]   OVER THE COUNTER MEDICATION Take 1 capsule by mouth daily. Joint support supplement from [facility]   OVER THE COUNTER MEDICATION Take 1 capsule by mouth daily. Cholesterol care supplement from [facility]   OVER THE COUNTER MEDICATION Take 1 capsule by mouth daily. Circulation and vein support supplement from [facility]   OVER THE COUNTER MEDICATION Take 1 capsule by mouth daily. Green vegetable supplement from [employer]   OVER THE COUNTER MEDICATION Take 1 capsule by mouth daily. Liver and brain supplement from [facility]   OVER THE COUNTER MEDICATION Take 1 capsule by mouth daily. Eye vitamin with lutein - supplement from [facility]   VITAMIN K PO Take 1 capsule by mouth daily. supplement from [facility]   No current facility-administered medications for this visit. (Other)   REVIEW OF SYSTEMS: ROS   Positive for: Neurological, Eyes Negative for: Constitutional, Gastrointestinal, Skin, Genitourinary, Musculoskeletal, HENT, Endocrine, Cardiovascular, Respiratory, Psychiatric, Allergic/Imm, Heme/Lymph Last edited by Thompson Grayer, COT on 12/04/2022  8:14 AM.     ALLERGIES Allergies  Allergen Reactions   Crab Extract Anaphylaxis    Eating crab causes severe allergy. Claims she is not allergic to all shellfish.    Penicillins Rash    Has patient had  a PCN reaction causing immediate rash, facial/tongue/throat swelling, SOB or lightheadedness with hypotension: Yes Has patient had a PCN reaction causing severe rash involving mucus membranes or skin necrosis: No Has patient had a PCN reaction that required hospitalization No Has patient had a PCN reaction occurring within the last 10 years: No If all of the above answers are "NO", then may proceed with Cephalosporin use.    PAST MEDICAL HISTORY Past Medical History:  Diagnosis Date   Agoraphobia with panic attacks    Anxiety attack    Major depression    Superficial laceration of hand ~  03/2015   left thumb   Past Surgical History:  Procedure Laterality Date   TONSILLECTOMY  1950's   FAMILY HISTORY Family History  Problem Relation Age of Onset   Macular degeneration Mother    SOCIAL HISTORY Social History   Tobacco Use   Smoking status: Every Day    Current packs/day: 0.33    Average packs/day: 0.3 packs/day for 40.0 years (13.2 ttl pk-yrs)    Types: Cigarettes   Smokeless tobacco: Never  Vaping Use   Vaping status: Never Used  Substance Use Topics   Alcohol use: No   Drug use: No       OPHTHALMIC EXAM:  Base Eye Exam     Visual Acuity (Snellen - Linear)       Right Left   Dist cc 20/50 -1 20/40   Dist ph cc 20/50 20/30    Correction: Glasses         Tonometry (Tonopen, 8:19 AM)       Right Left   Pressure 17 17         Pupils       Pupils Dark Light Shape React APD   Right PERRL 4 3 Round Brisk None   Left PERRL 4 3 Round Brisk None         Visual Fields (Counting fingers)       Left Right    Full Full         Extraocular Movement       Right Left    Full, Ortho Full, Ortho         Neuro/Psych     Oriented x3: Yes   Mood/Affect: Normal         Dilation     Both eyes: 1.0% Mydriacyl, 2.5% Phenylephrine @ 8:19 AM           Slit Lamp and Fundus Exam     Slit Lamp Exam       Right Left   Lids/Lashes Dermatochalasis - upper lid, mild MGD Dermatochalasis - upper lid, mild MGD   Conjunctiva/Sclera White and quiet White and quiet   Cornea mild arcus, 1+ inferior Punctate epithelial erosions, trace tear film debris mild arcus, 1+ inferior Punctate epithelial erosions, trace tear film debris   Anterior Chamber Deep and quiet Deep and quiet   Iris Round and dilated Round and dilated   Lens 3+ Nuclear sclerosis with brunescence, 3+ Cortical cataract 3+ Nuclear sclerosis, 3+ Cortical cataract   Anterior Vitreous Mild syneresis Mild syneresis, Posterior vitreous detachment, vitreous condensations          Fundus Exam       Right Left   Disc Pink and Sharp, temporal PPA, Compact Pink and Sharp, temporal PPA, Compact   C/D Ratio 0.5 0.5   Macula Blunted foveal reflex, +CNV with pigmented RPE rip and surrounding atrophy, stable improvement in shallow SRF,  no frank heme Flat, Blunted foveal reflex, fine drusen, RPE mottling and clumping, No heme or edema   Vessels attenuated, mild tortuosity attenuated, Tortuous   Periphery Attached, reticular degeneration, focal pigment clumping IT, No heme Attached, reticular degeneration, focal pigment clumping IT, No heme           Refraction     Wearing Rx       Sphere Cylinder Axis Add   Right -6.00 +1.75 117 +2.50   Left -5.25 +2.25 085 +2.50           IMAGING AND PROCEDURES  Imaging and Procedures for 12/04/2022  OCT, Retina - OU - Both Eyes       Right Eye Quality was good. Central Foveal Thickness: 428. Progression has been stable. Findings include no IRF, no SRF, abnormal foveal contour, subretinal hyper-reflective material, pigment epithelial detachment, vitreomacular adhesion (stable improvement in IRF, persistent central PED with trace pockets of SRF temporal to PED -- improved, partial PVD).   Left Eye Quality was good. Central Foveal Thickness: 274. Progression has been stable. Findings include normal foveal contour, no IRF, no SRF, retinal drusen , epiretinal membrane, macular pucker (Very mild drusen, mild ERM with early pucker SN mac).   Notes *Images captured and stored on drive  Diagnosis / Impression:  OD: exu ARMD - stable improvement in IRF, persistent central PED with trace pockets of SRF temporal to PED -- improved, partial PVD OS: non-exu ARMD - Very mild drusen, mild ERM with early pucker SN mac  Clinical management:  See below  Abbreviations: NFP - Normal foveal profile. CME - cystoid macular edema. PED - pigment epithelial detachment. IRF - intraretinal fluid. SRF - subretinal fluid. EZ - ellipsoid zone.  ERM - epiretinal membrane. ORA - outer retinal atrophy. ORT - outer retinal tubulation. SRHM - subretinal hyper-reflective material. IRHM - intraretinal hyper-reflective material      Intravitreal Injection, Pharmacologic Agent - OD - Right Eye       Time Out 12/04/2022. 9:07 AM. Confirmed correct patient, procedure, site, and patient consented.   Anesthesia Topical anesthesia was used. Anesthetic medications included Lidocaine 2%, Proparacaine 0.5%.   Procedure Preparation included 5% betadine to ocular surface, eyelid speculum. A (32g) needle was used.   Injection: 6 mg faricimab-svoa 6 MG/0.05ML   Route: Intravitreal, Site: Right Eye   NDC: O8010301, Lot: [id number], Expiration date: 12/12/2024, Waste: 0 mL   Post-op Post injection exam found visual acuity of at least counting fingers. The patient tolerated the procedure well. There were no complications. The patient received written and verbal post procedure care education. Post injection medications were not given.            ASSESSMENT/PLAN:    ICD-10-CM   1. Exudative age-related macular degeneration of right eye with active choroidal neovascularization (HCC)  H35.3211 OCT, Retina - OU - Both Eyes    Intravitreal Injection, Pharmacologic Agent - OD - Right Eye    faricimab-svoa (VABYSMO) 6mg /0.33mL intravitreal injection    2. 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 Epiretinal membrane (ERM) of left eye  H35.372     4. Essential hypertension  I10     5. Hypertensive retinopathy of both eyes  H35.033     6. Combined forms of age-related cataract of both eyes  H25.813      1. 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, right eye  - s/p IVA OD #1 (03.16.23), #2 (04.13.23), #3 (05.11.23), #  4 (06.08.23) -- IVA resistance  - s/p IVE OD #1 (07.06.23), #2 (08.03.23), #3 (08.31.23), #4 (10.05.23) -- IVE resistance  - s/p IVV OD #1 (11.09.23), #2 (12.07.23), #3  (01.04.24), #4 (02.01.24), #5 (02.29.24), #6 (03.15.24), #7 (04.25.24), #8 (05.30.24), #9 (06.27.24), #10 (07.25.24) **history of increased IRF at 5 weeks, noted on 05.30.24 exam** - pt presented initially on 3.16.23 with 2 wk history of central vision loss and distortion  - initial exam and OCT OD stable improvement in IRF, interval improvement in central PED and surrounding SRF, partial PVD - BCVA OD stable at 20/50  - OCT shows stable improvement in IRF, persistent central PED with trace pockets of SRF temporal to PED, partial PVD at 4 weeks - recommend IVV OD #11 today, 08.22.24 w/ f/u at 4 wks - pt wishes to proceed with injection - RBA of procedure discussed, questions answered - IVV informed consent obtained and signed, 11.09.23 (OD) - see procedure note  - f/u 4 weeks, DFE, OCT, possible injection  2. Age related macular degeneration, non-exudative, left eye  - mild/intermediate stage with mild drusen  - The incidence, anatomy, and pathology of dry AMD, risk of progression, and the AREDS and AREDS 2 study including smoking risks discussed with patient.   - Recommend amsler grid monitoring  3. Epiretinal membrane, left eye  - mild ERM OS w/ early pucker, SN macula - BCVA 20/30 - asymptomatic, no metamorphopsia - no indication for surgery at this time - monitor for now  4,5. Hypertensive retinopathy OU - discussed importance of tight BP control - monitor   6. Mixed Cataract OU - The symptoms of cataract, surgical options, and treatments and risks were discussed with patient.  - discussed diagnosis and progression - referred to Dr. Delaney Meigs for consult -- pt had appt on April 23rd, but had to cancel -- does not have appt yet  Ophthalmic Meds Ordered this visit:  Meds ordered this encounter  Medications   faricimab-svoa (VABYSMO) 6mg /0.77mL intravitreal injection     Return in about 4 weeks (around 01/01/2023) for f/u exu ARMD OD, DFE, OCT.  There are no Patient  Instructions on file for this visit.   Explained the diagnoses, plan, and follow up with the patient and they expressed understanding.  Patient expressed understanding of the importance of proper follow up care.   This document serves as a record of services personally performed by Karie Chimera, MD, PhD. It was created on their behalf by Glee Arvin. Manson Passey, OA an ophthalmic technician. The creation of this record is the provider's dictation and/or activities during the visit.    Electronically signed by: Glee Arvin. Manson Passey, OA 12/04/22 10:38 AM  Karie Chimera, M.D., Ph.D. Diseases & Surgery of the Retina and Vitreous [facility]  I have reviewed the above documentation for accuracy and completeness, and I agree with the above. Karie Chimera, M.D., Ph.D. 12/04/22 10:39 AM  Abbreviations: M myopia (nearsighted); A astigmatism; H hyperopia (farsighted); P presbyopia; Mrx spectacle prescription;  CTL contact lenses; OD right eye; OS left eye; OU both eyes  XT exotropia; ET esotropia; PEK punctate epithelial keratitis; PEE punctate epithelial erosions; DES dry eye syndrome; MGD meibomian gland dysfunction; ATs artificial tears; PFAT's preservative free artificial tears; NSC nuclear sclerotic cataract; PSC posterior subcapsular cataract; ERM epi-retinal membrane; PVD posterior vitreous detachment; RD retinal detachment; DM diabetes mellitus; DR diabetic retinopathy; NPDR non-proliferative diabetic retinopathy; PDR proliferative diabetic retinopathy; CSME clinically significant macular edema; DME diabetic  macular edema; dbh dot blot hemorrhages; CWS cotton wool spot; POAG primary open angle glaucoma; C/D cup-to-disc ratio; HVF humphrey visual field; GVF goldmann visual field; OCT optical coherence tomography; IOP intraocular pressure; BRVO Branch retinal vein occlusion; CRVO central retinal vein occlusion; CRAO central retinal artery occlusion; BRAO branch retinal artery occlusion;  RT retinal tear; SB scleral buckle; PPV pars plana vitrectomy; VH Vitreous hemorrhage; PRP panretinal laser photocoagulation; IVK intravitreal kenalog; VMT vitreomacular traction; MH Macular hole;  NVD neovascularization of the disc; NVE neovascularization elsewhere; AREDS age related eye disease study; ARMD age related macular degeneration; POAG primary open angle glaucoma; EBMD epithelial/anterior basement membrane dystrophy; ACIOL anterior chamber intraocular lens; IOL intraocular lens; PCIOL posterior chamber intraocular lens; Phaco/IOL phacoemulsification with intraocular lens placement; PRK photorefractive keratectomy; LASIK laser assisted in situ keratomileusis; HTN hypertension; DM diabetes mellitus; COPD chronic obstructive pulmonary disease

## 2022-12-04 ENCOUNTER — Ambulatory Visit (INDEPENDENT_AMBULATORY_CARE_PROVIDER_SITE_OTHER): Payer: Medicare Other | Admitting: Ophthalmology

## 2022-12-04 ENCOUNTER — Encounter (INDEPENDENT_AMBULATORY_CARE_PROVIDER_SITE_OTHER): Payer: Self-pay | Admitting: Ophthalmology

## 2022-12-04 DIAGNOSIS — H353122 Nonexudative age-related macular degeneration, left eye, intermediate dry stage: Secondary | ICD-10-CM | POA: Diagnosis not present

## 2022-12-04 DIAGNOSIS — H35033 Hypertensive retinopathy, bilateral: Secondary | ICD-10-CM

## 2022-12-04 DIAGNOSIS — I10 Essential (primary) hypertension: Secondary | ICD-10-CM | POA: Diagnosis not present

## 2022-12-04 DIAGNOSIS — H25813 Combined forms of age-related cataract, bilateral: Secondary | ICD-10-CM

## 2022-12-04 DIAGNOSIS — H353211 Exudative age-related macular degeneration, right eye, with active choroidal neovascularization: Secondary | ICD-10-CM

## 2022-12-04 DIAGNOSIS — H35372 Puckering of macula, left eye: Secondary | ICD-10-CM

## 2022-12-04 MED ORDER — FARICIMAB-SVOA 6 MG/0.05ML IZ SOLN
6.0000 mg | INTRAVITREAL | Status: AC | PRN
Start: 2022-12-04 — End: 2022-12-04
  Administered 2022-12-04: 6 mg via INTRAVITREAL

## 2022-12-18 NOTE — Progress Notes (Signed)
[facility] - Clinic Note  01/01/2023    CHIEF COMPLAINT Patient presents for Retina Follow Up  HISTORY OF PRESENT ILLNESS: Kimberly Noble is a 77 y.o. female who presents to the clinic today for:   HPI     Retina Follow Up   Patient presents with  Wet AMD.  In both eyes.  This started 4 weeks ago.  Duration of 4 weeks.  Since onset it is stable.  I, the attending physician,  performed the HPI with the patient and updated documentation appropriately.        Comments   4 week retina follow up ARMD and IVV OD pt is reporting no vision changes noticed she denies any flashes or floaters pt has appointment with Dr Delaney Meigs end of November for cat eval       Last edited by Rennis Chris, MD on 01/01/2023 10:54 AM.    Patient states that she has an appointment with Dr. Delaney Meigs at the end of next month. She has noticed a decrease in vision when reading the eye chart.   Referring physician: No referring provider defined for this encounter.  HISTORICAL INFORMATION:   Selected notes from the MEDICAL RECORD NUMBER Referred by Dr. Zenaida Niece for PED OD LEE:  Ocular Hx- PMH-    CURRENT MEDICATIONS: No current outpatient medications on file. (Ophthalmic Drugs)   No current facility-administered medications for this visit. (Ophthalmic Drugs)   Current Outpatient Medications (Other)  Medication Sig   ALPRAZolam (XANAX) 1 MG tablet Take 1 mg by mouth 3 (three) times daily. 7am, 1pm, 7pm   Ascorbic Acid (VITAMIN C PO) Take 1 capsule by mouth daily. supplement from [facility]   Biotin w/ Vitamins C & E (HAIR/SKIN/NAILS PO) Take 1 capsule by mouth daily. supplement from [facility]   CALCIUM CARBONATE-VITAMIN D PO Take 1 capsule by mouth daily. supplement from [facility]   Cholecalciferol (VITAMIN D3 PO) Take 1 capsule by mouth daily. supplement from [facility]   Coenzyme Q10 (COQ10 PO) Take 1 capsule by mouth daily.   CRANBERRY PO Take 1 capsule by mouth daily.  supplement from [facility]   Multiple Vitamin (MULTIVITAMIN WITH MINERALS) TABS tablet Take 1 tablet by mouth daily. supplement from [facility]   Omega-3 Fatty Acids (FISH OIL PO) Take 1 capsule by mouth daily. supplement from [facility]   OVER THE COUNTER MEDICATION Take 1 capsule by mouth daily. Joint support supplement from [facility]   OVER THE COUNTER MEDICATION Take 1 capsule by mouth daily. Cholesterol care supplement from [facility]   OVER THE COUNTER MEDICATION Take 1 capsule by mouth daily. Circulation and vein support supplement from [facility]   OVER THE COUNTER MEDICATION Take 1 capsule by mouth daily. Green vegetable supplement from [employer]   OVER THE COUNTER MEDICATION Take 1 capsule by mouth daily. Liver and brain supplement from [facility]   OVER THE COUNTER MEDICATION Take 1 capsule by mouth daily. Eye vitamin with lutein - supplement from [facility]   VITAMIN K PO Take 1 capsule by mouth daily. supplement from [facility]   No current facility-administered medications for this visit. (Other)   REVIEW OF SYSTEMS: ROS   Positive for: Neurological, Eyes Negative for: Constitutional, Gastrointestinal, Skin, Genitourinary, Musculoskeletal, HENT, Endocrine, Cardiovascular, Respiratory, Psychiatric, Allergic/Imm, Heme/Lymph Last edited by Etheleen Mayhew, COT on 01/01/2023  8:43 AM.      ALLERGIES Allergies  Allergen Reactions   Crab Extract Anaphylaxis  Eating crab causes severe allergy. Claims she is not allergic to all shellfish.    Penicillins Rash    Has patient had a PCN reaction causing immediate rash, facial/tongue/throat swelling, SOB or lightheadedness with hypotension: Yes Has patient had a PCN reaction causing severe rash involving mucus membranes or skin necrosis: No Has patient had a PCN reaction that required hospitalization No Has patient had a PCN reaction occurring within the last 10 years: No If all of the above answers are "NO", then may proceed with Cephalosporin  use.    PAST MEDICAL HISTORY Past Medical History:  Diagnosis Date   Agoraphobia with panic attacks    Anxiety attack    Major depression    Superficial laceration of hand ~ 03/2015   left thumb   Past Surgical History:  Procedure Laterality Date   TONSILLECTOMY  1950's   FAMILY HISTORY Family History  Problem Relation Age of Onset   Macular degeneration Mother    SOCIAL HISTORY Social History   Tobacco Use   Smoking status: Every Day    Current packs/day: 0.33    Average packs/day: 0.3 packs/day for 40.0 years (13.2 ttl pk-yrs)    Types: Cigarettes   Smokeless tobacco: Never  Vaping Use   Vaping status: Never Used  Substance Use Topics   Alcohol use: No   Drug use: No       OPHTHALMIC EXAM:  Base Eye Exam     Visual Acuity (Snellen - Linear)       Right Left   Dist cc 20/60 20/40 -2   Dist ph cc NI NI    Correction: Glasses         Tonometry (Tonopen, 8:52 AM)       Right Left   Pressure 12 14         Pupils       Pupils Dark Light Shape React APD   Right PERRL 4 3 Round Brisk None   Left PERRL 4 3 Round Brisk None         Visual Fields       Left Right    Full Full         Extraocular Movement       Right Left    Full, Ortho Full, Ortho         Neuro/Psych     Oriented x3: Yes   Mood/Affect: Normal         Dilation     Both eyes: 2.5% Phenylephrine @ 8:52 AM           Slit Lamp and Fundus Exam     Slit Lamp Exam       Right Left   Lids/Lashes Dermatochalasis - upper lid, mild MGD Dermatochalasis - upper lid, mild MGD   Conjunctiva/Sclera White and quiet White and quiet   Cornea mild arcus, 1+ inferior Punctate epithelial erosions, trace tear film debris mild arcus, 1+ inferior Punctate epithelial erosions, trace tear film debris   Anterior Chamber Deep and quiet Deep and quiet   Iris Round and dilated Round and dilated   Lens 3+ Nuclear sclerosis with brunescence, 3+ Cortical cataract 3+ Nuclear  sclerosis, 3+ Cortical cataract   Anterior Vitreous Mild syneresis Mild syneresis, Posterior vitreous detachment, vitreous condensations         Fundus Exam       Right Left   Disc Pink and Sharp, temporal PPA, Compact Pink and Sharp, temporal PPA, Compact   C/D Ratio 0.5  0.5   Macula Blunted foveal reflex, +CNV with pigmented RPE rip and surrounding atrophy, stable improvement in shallow SRF, no frank heme Flat, Blunted foveal reflex, fine drusen, RPE mottling and clumping, No heme or edema   Vessels attenuated, mild tortuosity attenuated, Tortuous   Periphery Attached, reticular degeneration, focal pigment clumping IT, No heme Attached, reticular degeneration, focal pigment clumping IT, No heme           Refraction     Wearing Rx       Sphere Cylinder Axis Add   Right -6.00 +1.75 117 +2.50   Left -5.25 +2.25 085 +2.50           IMAGING AND PROCEDURES  Imaging and Procedures for 01/01/2023  OCT, Retina - OU - Both Eyes       Right Eye Quality was good. Central Foveal Thickness: 416. Progression has been stable. Findings include no IRF, no SRF, abnormal foveal contour, subretinal hyper-reflective material, pigment epithelial detachment, vitreomacular adhesion (stable improvement in IRF, persistent central PED with trace pockets of SRF temporal to PED and overlying, partial PVD).   Left Eye Quality was good. Central Foveal Thickness: 260. Progression has been stable. Findings include normal foveal contour, no IRF, no SRF, retinal drusen , epiretinal membrane, macular pucker (Very mild drusen, mild ERM with early pucker SN mac).   Notes *Images captured and stored on drive  Diagnosis / Impression:  OD: exu ARMD - stable improvement in IRF, persistent central PED with trace pockets of SRF temporal to PED and overlying, partial PVD OS: non-exu ARMD - Very mild drusen, mild ERM with early pucker SN mac  Clinical management:  See below  Abbreviations: NFP - Normal  foveal profile. CME - cystoid macular edema. PED - pigment epithelial detachment. IRF - intraretinal fluid. SRF - subretinal fluid. EZ - ellipsoid zone. ERM - epiretinal membrane. ORA - outer retinal atrophy. ORT - outer retinal tubulation. SRHM - subretinal hyper-reflective material. IRHM - intraretinal hyper-reflective material      Intravitreal Injection, Pharmacologic Agent - OD - Right Eye       Time Out 01/01/2023. 9:22 AM. Confirmed correct patient, procedure, site, and patient consented.   Anesthesia Topical anesthesia was used. Anesthetic medications included Lidocaine 2%, Proparacaine 0.5%.   Procedure Preparation included 5% betadine to ocular surface, eyelid speculum. A (32g) needle was used.   Injection: 6 mg faricimab-svoa 6 MG/0.05ML   Route: Intravitreal, Site: Right Eye   NDC: [id number], Lot: [id number], Expiration date: 01/12/2024, Waste: 0 mL   Post-op Post injection exam found visual acuity of at least counting fingers. The patient tolerated the procedure well. There were no complications. The patient received written and verbal post procedure care education. Post injection medications were not given.             ASSESSMENT/PLAN:    ICD-10-CM   1. Exudative age-related macular degeneration of right eye with active choroidal neovascularization (HCC)  H35.3211 OCT, Retina - OU - Both Eyes    Intravitreal Injection, Pharmacologic Agent - OD - Right Eye    faricimab-svoa (VABYSMO) 6mg /0.46mL intravitreal injection    2. 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 Epiretinal membrane (ERM) of left eye  H35.372     4. Essential hypertension  I10     5. Hypertensive retinopathy of both eyes  H35.033     6. Combined forms of age-related cataract of both eyes  H25.813  1. 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, right eye - s/p IVA OD #1 (03.16.23), #2 (04.13.23), #3 (05.11.23), #4 (06.08.23) -- IVA  resistance - s/p IVE OD #1 (07.06.23), #2 (08.03.23), #3 (08.31.23), #4 (10.05.23) -- IVE resistance - s/p IVV OD #1 (11.09.23), #2 (12.07.23), #3 (01.04.24), #4 (02.01.24), #5 (02.29.24), #6 (03.15.24), #7 (04.25.24), #8 (05.30.24), #9 (06.27.24), #10 (07.25.24), #11 (08.22.24) **history of increased IRF at 5 weeks, noted on 05.30.24 exam** - pt presented initially on 3.16.23 with 2 wk history of central vision loss and distortion  - initial exam and OCT OD stable improvement in IRF, interval improvement in central PED and surrounding SRF, partial PVD - BCVA OD 20/60 from 20/50 - OCT shows stable improvement in IRF, persistent central PED with trace pockets of SRF temporal to PED and overlying, partial PVD at 4 weeks - recommend IVV OD #12 today, 09.19.24 w/ f/u at 4 wks - pt wishes to proceed with injection - RBA of procedure discussed, questions answered - IVV informed consent obtained and signed, 11.09.23 (OD) - see procedure note  - f/u 4 weeks, DFE, OCT, possible injection  2. Age related macular degeneration, non-exudative, left eye  - mild/intermediate stage with mild drusen - The incidence, anatomy, and pathology of dry AMD, risk of progression, and the AREDS and AREDS 2 study including smoking risks discussed with patient.   - Recommend amsler grid monitoring  3. Epiretinal membrane, left eye  - mild ERM OS w/ early pucker, SN macula - BCVA 20/40 decrease - asymptomatic, no metamorphopsia - no indication for surgery at this time - monitor for now  4,5. Hypertensive retinopathy OU - discussed importance of tight BP control - monitor   6. Mixed Cataract OU - The symptoms of cataract, surgical options, and treatments and risks were discussed with patient.  - discussed diagnosis and progression - referred to Dr. Delaney Meigs for consult -- pt had appt on April 23rd, but had to cancel -- now has appt in October  Ophthalmic Meds Ordered this visit:  Meds ordered this encounter   Medications   faricimab-svoa (VABYSMO) 6mg /0.36mL intravitreal injection     Return in about 4 weeks (around 01/29/2023) for f/u Ex. AMD OD , DFE, OCT, Possible, IVV, OD.  There are no Patient Instructions on file for this visit.   Explained the diagnoses, plan, and follow up with the patient and they expressed understanding.  Patient expressed understanding of the importance of proper follow up care.   This document serves as a record of services personally performed by Karie Chimera, MD, PhD. It was created on their behalf by Glee Arvin. Manson Passey, OA an ophthalmic technician. The creation of this record is the provider's dictation and/or activities during the visit.    Electronically signed by: Glee Arvin. Manson Passey, OA 01/01/23 4:29 PM  This document serves as a record of services personally performed by Karie Chimera, MD, PhD. It was created on their behalf by Charlette Caffey, COT an ophthalmic technician. The creation of this record is the provider's dictation and/or activities during the visit.    Electronically signed by:  Charlette Caffey, COT  01/01/23 4:29 PM  Karie Chimera, M.D., Ph.D. Diseases & Surgery of the Retina and Vitreous [facility]  I have reviewed the above documentation for accuracy and completeness, and I agree with the above. Karie Chimera, M.D., Ph.D. 01/01/23 4:30 PM  Abbreviations: M myopia (nearsighted); A astigmatism; H hyperopia (farsighted); P presbyopia; Mrx  spectacle prescription;  CTL contact lenses; OD right eye; OS left eye; OU both eyes  XT exotropia; ET esotropia; PEK punctate epithelial keratitis; PEE punctate epithelial erosions; DES dry eye syndrome; MGD meibomian gland dysfunction; ATs artificial tears; PFAT's preservative free artificial tears; NSC nuclear sclerotic cataract; PSC posterior subcapsular cataract; ERM epi-retinal membrane; PVD posterior vitreous detachment; RD retinal detachment; DM diabetes mellitus; DR  diabetic retinopathy; NPDR non-proliferative diabetic retinopathy; PDR proliferative diabetic retinopathy; CSME clinically significant macular edema; DME diabetic macular edema; dbh dot blot hemorrhages; CWS cotton wool spot; POAG primary open angle glaucoma; C/D cup-to-disc ratio; HVF humphrey visual field; GVF goldmann visual field; OCT optical coherence tomography; IOP intraocular pressure; BRVO Branch retinal vein occlusion; CRVO central retinal vein occlusion; CRAO central retinal artery occlusion; BRAO branch retinal artery occlusion; RT retinal tear; SB scleral buckle; PPV pars plana vitrectomy; VH Vitreous hemorrhage; PRP panretinal laser photocoagulation; IVK intravitreal kenalog; VMT vitreomacular traction; MH Macular hole;  NVD neovascularization of the disc; NVE neovascularization elsewhere; AREDS age related eye disease study; ARMD age related macular degeneration; POAG primary open angle glaucoma; EBMD epithelial/anterior basement membrane dystrophy; ACIOL anterior chamber intraocular lens; IOL intraocular lens; PCIOL posterior chamber intraocular lens; Phaco/IOL phacoemulsification with intraocular lens placement; PRK photorefractive keratectomy; LASIK laser assisted in situ keratomileusis; HTN hypertension; DM diabetes mellitus; COPD chronic obstructive pulmonary disease

## 2023-01-01 ENCOUNTER — Encounter (INDEPENDENT_AMBULATORY_CARE_PROVIDER_SITE_OTHER): Payer: Self-pay | Admitting: Ophthalmology

## 2023-01-01 ENCOUNTER — Ambulatory Visit (INDEPENDENT_AMBULATORY_CARE_PROVIDER_SITE_OTHER): Payer: Medicare Other | Admitting: Ophthalmology

## 2023-01-01 DIAGNOSIS — H25813 Combined forms of age-related cataract, bilateral: Secondary | ICD-10-CM

## 2023-01-01 DIAGNOSIS — I10 Essential (primary) hypertension: Secondary | ICD-10-CM | POA: Diagnosis not present

## 2023-01-01 DIAGNOSIS — H353122 Nonexudative age-related macular degeneration, left eye, intermediate dry stage: Secondary | ICD-10-CM

## 2023-01-01 DIAGNOSIS — H35372 Puckering of macula, left eye: Secondary | ICD-10-CM | POA: Diagnosis not present

## 2023-01-01 DIAGNOSIS — H353211 Exudative age-related macular degeneration, right eye, with active choroidal neovascularization: Secondary | ICD-10-CM

## 2023-01-01 DIAGNOSIS — H35033 Hypertensive retinopathy, bilateral: Secondary | ICD-10-CM

## 2023-01-01 MED ORDER — FARICIMAB-SVOA 6 MG/0.05ML IZ SOSY
6.0000 mg | PREFILLED_SYRINGE | INTRAVITREAL | Status: AC | PRN
Start: 2023-01-01 — End: 2023-01-01
  Administered 2023-01-01: 6 mg via INTRAVITREAL

## 2023-01-22 NOTE — Progress Notes (Signed)
[facility] - Clinic Note  01/29/2023    CHIEF COMPLAINT Patient presents for Retina Follow Up  HISTORY OF PRESENT ILLNESS: Kimberly Noble is a 77 y.o. female who presents to the clinic today for:   HPI     Retina Follow Up   In right eye.  This started 4 weeks ago.  Duration of 4 weeks.  Since onset it is stable.  I, the attending physician,  performed the HPI with the patient and updated documentation appropriately.        Comments   4 week retina follow up ARMD and IVV OD pt is reporting no vision changes noticed she denies any flashes or floaters pt has appointment with [facility] end of November for Cat eval       Last edited by Rennis Chris, MD on 01/29/2023 10:41 AM.     Referring physician: No referring provider defined for this encounter.  HISTORICAL INFORMATION:   Selected notes from the MEDICAL RECORD NUMBER Referred by Dr. Zenaida Niece for PED OD LEE:  Ocular Hx- PMH-    CURRENT MEDICATIONS: No current outpatient medications on file. (Ophthalmic Drugs)   No current facility-administered medications for this visit. (Ophthalmic Drugs)   Current Outpatient Medications (Other)  Medication Sig   ALPRAZolam (XANAX) 1 MG tablet Take 1 mg by mouth 3 (three) times daily. 7am, 1pm, 7pm   Ascorbic Acid (VITAMIN C PO) Take 1 capsule by mouth daily. supplement from [facility]   Biotin w/ Vitamins C & E (HAIR/SKIN/NAILS PO) Take 1 capsule by mouth daily. supplement from [facility]   CALCIUM CARBONATE-VITAMIN D PO Take 1 capsule by mouth daily. supplement from [facility]   Cholecalciferol (VITAMIN D3 PO) Take 1 capsule by mouth daily. supplement from [facility]   Coenzyme Q10 (COQ10 PO) Take 1 capsule by mouth daily.   CRANBERRY PO Take 1 capsule by mouth daily. supplement from [facility]   Multiple Vitamin (MULTIVITAMIN WITH MINERALS) TABS tablet Take 1 tablet by mouth daily. supplement from [facility]   Omega-3 Fatty Acids (FISH OIL PO) Take 1 capsule by  mouth daily. supplement from [facility]   OVER THE COUNTER MEDICATION Take 1 capsule by mouth daily. Joint support supplement from [facility]   OVER THE COUNTER MEDICATION Take 1 capsule by mouth daily. Cholesterol care supplement from [facility]   OVER THE COUNTER MEDICATION Take 1 capsule by mouth daily. Circulation and vein support supplement from [facility]   OVER THE COUNTER MEDICATION Take 1 capsule by mouth daily. Green vegetable supplement from [employer]   OVER THE COUNTER MEDICATION Take 1 capsule by mouth daily. Liver and brain supplement from [facility]   OVER THE COUNTER MEDICATION Take 1 capsule by mouth daily. Eye vitamin with lutein - supplement from [facility]   VITAMIN K PO Take 1 capsule by mouth daily. supplement from [facility]   No current facility-administered medications for this visit. (Other)   REVIEW OF SYSTEMS: ROS   Positive for: Neurological, Eyes Negative for: Constitutional, Gastrointestinal, Skin, Genitourinary, Musculoskeletal, HENT, Endocrine, Cardiovascular, Respiratory, Psychiatric, Allergic/Imm, Heme/Lymph Last edited by Etheleen Mayhew, COT on 01/29/2023  8:44 AM.     ALLERGIES Allergies  Allergen Reactions   Crab Extract Anaphylaxis    Eating crab causes severe allergy. Claims she is not allergic to all shellfish.    Penicillins Rash    Has patient had a PCN reaction causing immediate rash, facial/tongue/throat swelling, SOB or lightheadedness  with hypotension: Yes Has patient had a PCN reaction causing severe rash involving mucus membranes or skin necrosis: No Has patient had a PCN reaction that required hospitalization No Has patient had a PCN reaction occurring within the last 10 years: No If all of the above answers are "NO", then may proceed with Cephalosporin use.    PAST MEDICAL HISTORY Past Medical History:  Diagnosis Date   Agoraphobia with panic attacks    Anxiety attack    Major depression    Superficial laceration of hand ~ 03/2015    left thumb   Past Surgical History:  Procedure Laterality Date   TONSILLECTOMY  1950's   FAMILY HISTORY Family History  Problem Relation Age of Onset   Macular degeneration Mother    SOCIAL HISTORY Social History   Tobacco Use   Smoking status: Every Day    Current packs/day: 0.33    Average packs/day: 0.3 packs/day for 40.0 years (13.2 ttl pk-yrs)    Types: Cigarettes   Smokeless tobacco: Never  Vaping Use   Vaping status: Never Used  Substance Use Topics   Alcohol use: No   Drug use: No       OPHTHALMIC EXAM:  Base Eye Exam     Visual Acuity (Snellen - Linear)       Right Left   Dist cc 20/60 20/40 -1   Dist ph cc 20/40 NI    Correction: Glasses         Tonometry (Tonopen, 8:49 AM)       Right Left   Pressure 13 15         Pupils       Pupils Dark Light Shape React APD   Right PERRL 4 3 Round Brisk None   Left PERRL 4 3 Round Brisk None         Visual Fields       Left Right    Full Full         Extraocular Movement       Right Left    Full, Ortho Full, Ortho         Neuro/Psych     Oriented x3: Yes   Mood/Affect: Normal         Dilation     Both eyes: 2.5% Phenylephrine @ 8:49 AM           Slit Lamp and Fundus Exam     Slit Lamp Exam       Right Left   Lids/Lashes Dermatochalasis - upper lid, mild MGD Dermatochalasis - upper lid, mild MGD   Conjunctiva/Sclera White and quiet White and quiet   Cornea mild arcus, 1+ inferior Punctate epithelial erosions, trace tear film debris mild arcus, 1+ inferior Punctate epithelial erosions, trace tear film debris   Anterior Chamber Deep and quiet Deep and quiet   Iris Round and dilated Round and dilated   Lens 3+ Nuclear sclerosis with brunescence, 3+ Cortical cataract 3+ Nuclear sclerosis, 3+ Cortical cataract   Anterior Vitreous Mild syneresis Mild syneresis, Posterior vitreous detachment, vitreous condensations         Fundus Exam       Right Left   Disc Pink and  Sharp, temporal PPA, Compact Pink and Sharp, temporal PPA, Compact   C/D Ratio 0.5 0.5   Macula Blunted foveal reflex, +CNV with pigmented RPE rip and surrounding atrophy, stable improvement in shallow SRF, no frank heme Flat, Blunted foveal reflex, fine drusen, RPE mottling and clumping, No heme  or edema   Vessels attenuated, mild tortuosity attenuated, Tortuous   Periphery Attached, reticular degeneration, focal pigment clumping IT, No heme Attached, reticular degeneration, focal pigment clumping IT, No heme           Refraction     Wearing Rx       Sphere Cylinder Axis Add   Right -6.00 +1.75 117 +2.50   Left -5.25 +2.25 085 +2.50           IMAGING AND PROCEDURES  Imaging and Procedures for 01/29/2023  OCT, Retina - OU - Both Eyes       Right Eye Quality was good. Central Foveal Thickness: 397. Progression has improved. Findings include no IRF, no SRF, abnormal foveal contour, subretinal hyper-reflective material, pigment epithelial detachment, vitreomacular adhesion (stable improvement in IRF, persistent central PED with trace pockets of SRF temporal to PED and overlying-- slightly improved, partial PVD).   Left Eye Quality was good. Central Foveal Thickness: 261. Progression has been stable. Findings include normal foveal contour, no IRF, no SRF, retinal drusen , epiretinal membrane, macular pucker (Very mild drusen, mild ERM with early pucker SN mac).   Notes *Images captured and stored on drive  Diagnosis / Impression:  OD: exu ARMD - stable improvement in IRF, persistent central PED with trace pockets of SRF temporal to PED and overlying-- slightly improved, partial PVD OS: non-exu ARMD - Very mild drusen, mild ERM with early pucker SN mac  Clinical management:  See below  Abbreviations: NFP - Normal foveal profile. CME - cystoid macular edema. PED - pigment epithelial detachment. IRF - intraretinal fluid. SRF - subretinal fluid. EZ - ellipsoid zone. ERM -  epiretinal membrane. ORA - outer retinal atrophy. ORT - outer retinal tubulation. SRHM - subretinal hyper-reflective material. IRHM - intraretinal hyper-reflective material      Intravitreal Injection, Pharmacologic Agent - OD - Right Eye       Time Out 01/29/2023. 9:03 AM. Confirmed correct patient, procedure, site, and patient consented.   Anesthesia Topical anesthesia was used. Anesthetic medications included Lidocaine 2%, Proparacaine 0.5%.   Procedure Preparation included 5% betadine to ocular surface, eyelid speculum. A (32g) needle was used.   Injection: 6 mg faricimab-svoa 6 MG/0.05ML   Route: Intravitreal, Site: Right Eye   NDC: 24401-027-25, Lot: [id number], Expiration date: 01/12/2024, Waste: 0 mL   Post-op Post injection exam found visual acuity of at least counting fingers. The patient tolerated the procedure well. There were no complications. The patient received written and verbal post procedure care education. Post injection medications were not given.            ASSESSMENT/PLAN:    ICD-10-CM   1. Exudative age-related macular degeneration of right eye with active choroidal neovascularization (HCC)  H35.3211 OCT, Retina - OU - Both Eyes    Intravitreal Injection, Pharmacologic Agent - OD - Right Eye    faricimab-svoa (VABYSMO) 6mg /0.81mL intravitreal injection    2. 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 Epiretinal membrane (ERM) of left eye  H35.372     4. Essential hypertension  I10     5. Hypertensive retinopathy of both eyes  H35.033     6. Combined forms of age-related cataract of both eyes  H25.813      1. 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, right eye - s/p IVA OD #1 (03.16.23), #2 (04.13.23), #3 (05.11.23), #4 (06.08.23) -- IVA resistance ========================================================== - s/p IVE OD #1 (07.06.23), #2 (  08.03.23), #3 (08.31.23), #4 (10.05.23) -- IVE  resistance ========================================================== - s/p IVV OD #1 (11.09.23), #2 (12.07.23), #3 (01.04.24), #4 (02.01.24), #5 (02.29.24), #6 (03.15.24), #7 (04.25.24), #8 (05.30.24), #9 (06.27.24), #10 (07.25.24), #11 (08.22.24), #12 (09.19.24) **history of increased IRF at 5 weeks, noted on 05.30.24 exam** - pt presented initially on 3.16.23 with 2 wk history of central vision loss and distortion  - initial exam and OCT OD stable improvement in IRF, interval improvement in central PED and surrounding SRF, partial PVD - BCVA OD 20/40 from 20/60 - OCT shows stable improvement in IRF, persistent central PED with trace pockets of SRF temporal to PED and overlying-- slightly improved, partial PVD at 4 weeks - recommend IVV OD #13 today, 10.17.24 w/ f/u in 4 wks - pt wishes to proceed with injection - RBA of procedure discussed, questions answered - IVV informed consent obtained and signed, 11.09.23 (OD) - see procedure note  - f/u 4 weeks, DFE, OCT, possible injection  2. Age related macular degeneration, non-exudative, left eye  - mild/intermediate stage with mild drusen - The incidence, anatomy, and pathology of dry AMD, risk of progression, and the AREDS and AREDS 2 study including smoking risks discussed with patient.   - Recommend amsler grid monitoring  3. Epiretinal membrane, left eye  - mild ERM OS w/ early pucker, SN macula - BCVA 20/40 stable - asymptomatic, no metamorphopsia - no indication for surgery at this time - monitor for now  4,5. Hypertensive retinopathy OU - discussed importance of tight BP control - monitor   6. Mixed Cataract OU - The symptoms of cataract, surgical options, and treatments and risks were discussed with patient.  - discussed diagnosis and progression - referred to Dr. Delaney Meigs for consult -- pt had appt on April 23rd, but had to cancel -- now has appt in November  Ophthalmic Meds Ordered this visit:  Meds ordered this  encounter  Medications   faricimab-svoa (VABYSMO) 6mg /0.56mL intravitreal injection     Return in about 4 weeks (around 02/26/2023) for f/u Ex. AMD OD, DFE, OCT, Possible, IVV, OD.  There are no Patient Instructions on file for this visit.   Explained the diagnoses, plan, and follow up with the patient and they expressed understanding.  Patient expressed understanding of the importance of proper follow up care.   This document serves as a record of services personally performed by Karie Chimera, MD, PhD. It was created on their behalf by Glee Arvin. Manson Passey, OA an ophthalmic technician. The creation of this record is the provider's dictation and/or activities during the visit.    Electronically signed by: Glee Arvin. Manson Passey, OA 01/29/23 10:47 AM  This document serves as a record of services personally performed by Karie Chimera, MD, PhD. It was created on their behalf by Charlette Caffey, COT an ophthalmic technician. The creation of this record is the provider's dictation and/or activities during the visit.    Electronically signed by:  Charlette Caffey, COT  01/29/23 10:47 AM  Karie Chimera, M.D., Ph.D. Diseases & Surgery of the Retina and Vitreous [facility]  I have reviewed the above documentation for accuracy and completeness, and I agree with the above. Karie Chimera, M.D., Ph.D. 01/29/23 10:50 AM  Abbreviations: M myopia (nearsighted); A astigmatism; H hyperopia (farsighted); P presbyopia; Mrx spectacle prescription;  CTL contact lenses; OD right eye; OS left eye; OU both eyes  XT exotropia; ET esotropia; PEK punctate epithelial keratitis; PEE punctate  epithelial erosions; DES dry eye syndrome; MGD meibomian gland dysfunction; ATs artificial tears; PFAT's preservative free artificial tears; NSC nuclear sclerotic cataract; PSC posterior subcapsular cataract; ERM epi-retinal membrane; PVD posterior vitreous detachment; RD retinal detachment; DM diabetes  mellitus; DR diabetic retinopathy; NPDR non-proliferative diabetic retinopathy; PDR proliferative diabetic retinopathy; CSME clinically significant macular edema; DME diabetic macular edema; dbh dot blot hemorrhages; CWS cotton wool spot; POAG primary open angle glaucoma; C/D cup-to-disc ratio; HVF humphrey visual field; GVF goldmann visual field; OCT optical coherence tomography; IOP intraocular pressure; BRVO Branch retinal vein occlusion; CRVO central retinal vein occlusion; CRAO central retinal artery occlusion; BRAO branch retinal artery occlusion; RT retinal tear; SB scleral buckle; PPV pars plana vitrectomy; VH Vitreous hemorrhage; PRP panretinal laser photocoagulation; IVK intravitreal kenalog; VMT vitreomacular traction; MH Macular hole;  NVD neovascularization of the disc; NVE neovascularization elsewhere; AREDS age related eye disease study; ARMD age related macular degeneration; POAG primary open angle glaucoma; EBMD epithelial/anterior basement membrane dystrophy; ACIOL anterior chamber intraocular lens; IOL intraocular lens; PCIOL posterior chamber intraocular lens; Phaco/IOL phacoemulsification with intraocular lens placement; PRK photorefractive keratectomy; LASIK laser assisted in situ keratomileusis; HTN hypertension; DM diabetes mellitus; COPD chronic obstructive pulmonary disease

## 2023-01-29 ENCOUNTER — Encounter (INDEPENDENT_AMBULATORY_CARE_PROVIDER_SITE_OTHER): Payer: Self-pay | Admitting: Ophthalmology

## 2023-01-29 ENCOUNTER — Ambulatory Visit (INDEPENDENT_AMBULATORY_CARE_PROVIDER_SITE_OTHER): Payer: Medicare Other | Admitting: Ophthalmology

## 2023-01-29 DIAGNOSIS — H35372 Puckering of macula, left eye: Secondary | ICD-10-CM

## 2023-01-29 DIAGNOSIS — H353211 Exudative age-related macular degeneration, right eye, with active choroidal neovascularization: Secondary | ICD-10-CM | POA: Diagnosis not present

## 2023-01-29 DIAGNOSIS — H353122 Nonexudative age-related macular degeneration, left eye, intermediate dry stage: Secondary | ICD-10-CM

## 2023-01-29 DIAGNOSIS — I10 Essential (primary) hypertension: Secondary | ICD-10-CM | POA: Diagnosis not present

## 2023-01-29 DIAGNOSIS — H25813 Combined forms of age-related cataract, bilateral: Secondary | ICD-10-CM

## 2023-01-29 DIAGNOSIS — H35033 Hypertensive retinopathy, bilateral: Secondary | ICD-10-CM

## 2023-01-29 MED ORDER — FARICIMAB-SVOA 6 MG/0.05ML IZ SOSY
6.0000 mg | PREFILLED_SYRINGE | INTRAVITREAL | Status: AC | PRN
Start: 2023-01-29 — End: 2023-01-29
  Administered 2023-01-29: 6 mg via INTRAVITREAL

## 2023-02-13 NOTE — Progress Notes (Signed)
[facility] - Clinic Note  02/26/2023    CHIEF COMPLAINT Patient presents for Retina Follow Up  HISTORY OF PRESENT ILLNESS: Kimberly Noble is a 77 y.o. female who presents to the clinic today for:   HPI     Retina Follow Up   Patient presents with  Wet AMD.  In both eyes.  This started 4 weeks ago.  Duration of 4 weeks.  Since onset it is stable.  I, the attending physician,  performed the HPI with the patient and updated documentation appropriately.        Comments   4 week retina follow up ARMD OD and IVV OD pt is reporting no vision changes noticed she denies any flashes or floaters       Last edited by Rennis Chris, MD on 02/26/2023 12:24 PM.    Pt feels like vision has gotten worse, she has an appt with Dr. Delaney Meigs November 21st  Referring physician: No referring provider defined for this encounter.  HISTORICAL INFORMATION:   Selected notes from the MEDICAL RECORD NUMBER Referred by Dr. Zenaida Niece for PED OD LEE:  Ocular Hx- PMH-    CURRENT MEDICATIONS: No current outpatient medications on file. (Ophthalmic Drugs)   No current facility-administered medications for this visit. (Ophthalmic Drugs)   Current Outpatient Medications (Other)  Medication Sig   ALPRAZolam (XANAX) 1 MG tablet Take 1 mg by mouth 3 (three) times daily. 7am, 1pm, 7pm   Ascorbic Acid (VITAMIN C PO) Take 1 capsule by mouth daily. supplement from [facility]   Biotin w/ Vitamins C & E (HAIR/SKIN/NAILS PO) Take 1 capsule by mouth daily. supplement from [facility]   CALCIUM CARBONATE-VITAMIN D PO Take 1 capsule by mouth daily. supplement from [facility]   Cholecalciferol (VITAMIN D3 PO) Take 1 capsule by mouth daily. supplement from [facility]   Coenzyme Q10 (COQ10 PO) Take 1 capsule by mouth daily.   CRANBERRY PO Take 1 capsule by mouth daily. supplement from [facility]   Multiple Vitamin (MULTIVITAMIN WITH MINERALS) TABS tablet Take 1 tablet by mouth daily. supplement from [facility]   Omega-3 Fatty Acids (FISH OIL PO) Take 1 capsule by mouth daily. supplement from [facility]   OVER THE COUNTER MEDICATION Take 1 capsule by mouth daily. Joint support supplement from [facility]   OVER THE COUNTER MEDICATION Take 1 capsule by mouth daily. Cholesterol care supplement from [facility]   OVER THE COUNTER MEDICATION Take 1 capsule by mouth daily. Circulation and vein support supplement from [facility]   OVER THE COUNTER MEDICATION Take 1 capsule by mouth daily. Green vegetable supplement from [employer]   OVER THE COUNTER MEDICATION Take 1 capsule by mouth daily. Liver and brain supplement from [facility]   OVER THE COUNTER MEDICATION Take 1 capsule by mouth daily. Eye vitamin with lutein - supplement from [facility]   VITAMIN K PO Take 1 capsule by mouth daily. supplement from [facility]   No current facility-administered medications for this visit. (Other)   REVIEW OF SYSTEMS: ROS   Positive for: Neurological, Eyes Negative for: Constitutional, Gastrointestinal, Skin, Genitourinary, Musculoskeletal, HENT, Endocrine, Cardiovascular, Respiratory, Psychiatric, Allergic/Imm, Heme/Lymph Last edited by Etheleen Mayhew, COT on 02/26/2023  9:00 AM.      ALLERGIES Allergies  Allergen Reactions   Crab Extract Anaphylaxis    Eating crab causes severe allergy. Claims she is not allergic to all shellfish.    Penicillins Rash  Has patient had a PCN reaction causing immediate rash, facial/tongue/throat swelling, SOB or lightheadedness with hypotension: Yes Has patient had a PCN reaction causing severe rash involving mucus membranes or skin necrosis: No Has patient had a PCN reaction that required hospitalization No Has patient had a PCN reaction occurring within the last 10 years: No If all of the above answers are "NO", then may proceed with Cephalosporin use.    PAST MEDICAL HISTORY Past Medical History:  Diagnosis Date   Agoraphobia with panic attacks    Anxiety attack    Major  depression    Superficial laceration of hand ~ 03/2015   left thumb   Past Surgical History:  Procedure Laterality Date   TONSILLECTOMY  1950's   FAMILY HISTORY Family History  Problem Relation Age of Onset   Macular degeneration Mother    SOCIAL HISTORY Social History   Tobacco Use   Smoking status: Every Day    Current packs/day: 0.33    Average packs/day: 0.3 packs/day for 40.0 years (13.2 ttl pk-yrs)    Types: Cigarettes   Smokeless tobacco: Never  Vaping Use   Vaping status: Never Used  Substance Use Topics   Alcohol use: No   Drug use: No       OPHTHALMIC EXAM:  Base Eye Exam     Visual Acuity (Snellen - Linear)       Right Left   Dist cc 20/50 20/50 -2   Dist ph cc NI NI    Correction: Glasses         Tonometry (Tonopen, 9:10 AM)       Right Left   Pressure 16 18         Pupils       Pupils Dark Light Shape React APD   Right PERRL 4 3 Round Brisk None   Left PERRL 4 3 Round Brisk None         Visual Fields       Left Right    Full Full         Extraocular Movement       Right Left    Full, Ortho Full, Ortho         Neuro/Psych     Oriented x3: Yes   Mood/Affect: Normal         Dilation     Both eyes: 2.5% Phenylephrine @ 9:10 AM           Slit Lamp and Fundus Exam     Slit Lamp Exam       Right Left   Lids/Lashes Dermatochalasis - upper lid, mild MGD Dermatochalasis - upper lid, mild MGD   Conjunctiva/Sclera White and quiet White and quiet   Cornea mild arcus, trace PEE, trace tear film debris mild arcus, trace PEE, trace tear film debris   Anterior Chamber Deep and quiet Deep and quiet   Iris Round and dilated Round and dilated   Lens 3+ Nuclear sclerosis with brunescence, 3+ Cortical cataract 3+ Nuclear sclerosis, 3+ Cortical cataract   Anterior Vitreous Mild syneresis Mild syneresis, Posterior vitreous detachment, vitreous condensations         Fundus Exam       Right Left   Disc Pink and  Sharp, temporal PPA, Compact Pink and Sharp, temporal PPA, Compact   C/D Ratio 0.5 0.5   Macula Blunted foveal reflex, +CNV with pigmented RPE rip and surrounding atrophy, stable improvement in shallow SRF, no frank heme Flat, Blunted foveal reflex,  fine drusen, RPE mottling and clumping, No heme or edema   Vessels attenuated, Tortuous attenuated, Tortuous   Periphery Attached, reticular degeneration, focal pigment clumping IT, No heme Attached, reticular degeneration, focal pigment clumping IT, No heme           Refraction     Wearing Rx       Sphere Cylinder Axis Add   Right -6.00 +1.75 117 +2.50   Left -5.25 +2.25 085 +2.50           IMAGING AND PROCEDURES  Imaging and Procedures for 02/26/2023  OCT, Retina - OU - Both Eyes       Right Eye Quality was good. Central Foveal Thickness: 399. Progression has been stable. Findings include no IRF, no SRF, abnormal foveal contour, subretinal hyper-reflective material, pigment epithelial detachment, vitreomacular adhesion (stable improvement in IRF, persistent central PED with trace pockets of SRF temporal to PED, partial PVD).   Left Eye Quality was good. Central Foveal Thickness: 257. Progression has been stable. Findings include normal foveal contour, no IRF, no SRF, retinal drusen , epiretinal membrane, macular pucker (Very mild drusen, mild ERM with early pucker SN mac).   Notes *Images captured and stored on drive  Diagnosis / Impression:  OD: exu ARMD - stable improvement in IRF, persistent central PED with trace pockets of SRF temporal to PED, partial PVD OS: non-exu ARMD - Very mild drusen, mild ERM with early pucker SN mac  Clinical management:  See below  Abbreviations: NFP - Normal foveal profile. CME - cystoid macular edema. PED - pigment epithelial detachment. IRF - intraretinal fluid. SRF - subretinal fluid. EZ - ellipsoid zone. ERM - epiretinal membrane. ORA - outer retinal atrophy. ORT - outer retinal  tubulation. SRHM - subretinal hyper-reflective material. IRHM - intraretinal hyper-reflective material      Intravitreal Injection, Pharmacologic Agent - OD - Right Eye       Time Out 02/26/2023. 9:27 AM. Confirmed correct patient, procedure, site, and patient consented.   Anesthesia Topical anesthesia was used. Anesthetic medications included Lidocaine 2%, Proparacaine 0.5%.   Procedure Preparation included 5% betadine to ocular surface, eyelid speculum. A (32g) needle was used.   Injection: 6 mg faricimab-svoa 6 MG/0.05ML   Route: Intravitreal, Site: Right Eye   NDC: [id number], Lot: [id number], Expiration date: 07/12/2024, Waste: 0 mL   Post-op Post injection exam found visual acuity of at least counting fingers. The patient tolerated the procedure well. There were no complications. The patient received written and verbal post procedure care education. Post injection medications were not given.            ASSESSMENT/PLAN:    ICD-10-CM   1. Exudative age-related macular degeneration of right eye with active choroidal neovascularization (HCC)  H35.3211 OCT, Retina - OU - Both Eyes    Intravitreal Injection, Pharmacologic Agent - OD - Right Eye    faricimab-svoa (VABYSMO) 6mg /0.67mL intravitreal injection    2. 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 Epiretinal membrane (ERM) of left eye  H35.372     4. Essential hypertension  I10     5. Hypertensive retinopathy of both eyes  H35.033     6. Combined forms of age-related cataract of both eyes  H25.813      1. 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, right eye - s/p IVA OD #1 (03.16.23), #2 (04.13.23), #3 (05.11.23), #4 (06.08.23) -- IVA resistance ========================================================== - s/p IVE OD #1 (07.06.23), #2 (  08.03.23), #3 (08.31.23), #4 (10.05.23) -- IVE resistance ========================================================== - s/p  IVV OD #1 (11.09.23), #2 (12.07.23), #3 (01.04.24), #4 (02.01.24), #5 (02.29.24), #6 (03.15.24), #7 (04.25.24), #8 (05.30.24), #9 (06.27.24), #10 (07.25.24), #11 (08.22.24), #12 (09.19.24), #13 (10.17.24) **history of increased IRF at 5 weeks, noted on 05.30.24 exam** - pt presented initially on 3.16.23 with 2 wk history of central vision loss and distortion  - initial exam and OCT OD stable improvement in IRF, interval improvement in central PED and surrounding SRF, partial PVD - BCVA OD 20/50 from 20/40 (?cataract progression) - OCT shows stable improvement in IRF, persistent central PED with trace pockets of SRF temporal to PED, partial PVD at 4 weeks - recommend IVV OD #14 today, 11.14.24 w/ f/u in 4 wks - pt wishes to proceed with injection - RBA of procedure discussed, questions answered - IVV informed consent obtained and signed, 11.09.23 (OD) - see procedure note  - f/u 4 weeks, DFE, OCT, possible injection  2. Age related macular degeneration, non-exudative, left eye  - mild/intermediate stage with mild drusen - The incidence, anatomy, and pathology of dry AMD, risk of progression, and the AREDS and AREDS 2 study including smoking risks discussed with patient.   - Recommend amsler grid monitoring  3. Epiretinal membrane, left eye  - mild ERM OS w/ early pucker, SN macula - BCVA 20/40 stable - asymptomatic, no metamorphopsia - no indication for surgery at this time - monitor for now  4,5. Hypertensive retinopathy OU - discussed importance of tight BP control - monitor   6. Mixed Cataract OU - The symptoms of cataract, surgical options, and treatments and risks were discussed with patient.  - discussed diagnosis and progression - referred to Dr. Delaney Meigs for consult -- pt had appt on April 23rd, but had to cancel -- now has appt on November 21  Ophthalmic Meds Ordered this visit:  Meds ordered this encounter  Medications   faricimab-svoa (VABYSMO) 6mg /0.65mL  intravitreal injection     Return in about 4 weeks (around 03/26/2023) for f/u exu ARMD OD, DFE, OCT.  There are no Patient Instructions on file for this visit.   Explained the diagnoses, plan, and follow up with the patient and they expressed understanding.  Patient expressed understanding of the importance of proper follow up care.   This document serves as a record of services personally performed by Karie Chimera, MD, PhD. It was created on their behalf by Glee Arvin. Manson Passey, OA an ophthalmic technician. The creation of this record is the provider's dictation and/or activities during the visit.    Electronically signed by: Glee Arvin. Manson Passey, OA 02/26/23 12:24 PM  Karie Chimera, M.D., Ph.D. Diseases & Surgery of the Retina and Vitreous [facility]  I have reviewed the above documentation for accuracy and completeness, and I agree with the above. Karie Chimera, M.D., Ph.D. 02/26/23 12:25 PM   Abbreviations: M myopia (nearsighted); A astigmatism; H hyperopia (farsighted); P presbyopia; Mrx spectacle prescription;  CTL contact lenses; OD right eye; OS left eye; OU both eyes  XT exotropia; ET esotropia; PEK punctate epithelial keratitis; PEE punctate epithelial erosions; DES dry eye syndrome; MGD meibomian gland dysfunction; ATs artificial tears; PFAT's preservative free artificial tears; NSC nuclear sclerotic cataract; PSC posterior subcapsular cataract; ERM epi-retinal membrane; PVD posterior vitreous detachment; RD retinal detachment; DM diabetes mellitus; DR diabetic retinopathy; NPDR non-proliferative diabetic retinopathy; PDR proliferative diabetic retinopathy; CSME clinically significant macular edema; DME diabetic macular edema; dbh dot  blot hemorrhages; CWS cotton wool spot; POAG primary open angle glaucoma; C/D cup-to-disc ratio; HVF humphrey visual field; GVF goldmann visual field; OCT optical coherence tomography; IOP intraocular pressure; BRVO Branch retinal  vein occlusion; CRVO central retinal vein occlusion; CRAO central retinal artery occlusion; BRAO branch retinal artery occlusion; RT retinal tear; SB scleral buckle; PPV pars plana vitrectomy; VH Vitreous hemorrhage; PRP panretinal laser photocoagulation; IVK intravitreal kenalog; VMT vitreomacular traction; MH Macular hole;  NVD neovascularization of the disc; NVE neovascularization elsewhere; AREDS age related eye disease study; ARMD age related macular degeneration; POAG primary open angle glaucoma; EBMD epithelial/anterior basement membrane dystrophy; ACIOL anterior chamber intraocular lens; IOL intraocular lens; PCIOL posterior chamber intraocular lens; Phaco/IOL phacoemulsification with intraocular lens placement; PRK photorefractive keratectomy; LASIK laser assisted in situ keratomileusis; HTN hypertension; DM diabetes mellitus; COPD chronic obstructive pulmonary disease

## 2023-02-26 ENCOUNTER — Encounter (INDEPENDENT_AMBULATORY_CARE_PROVIDER_SITE_OTHER): Payer: Self-pay | Admitting: Ophthalmology

## 2023-02-26 ENCOUNTER — Ambulatory Visit (INDEPENDENT_AMBULATORY_CARE_PROVIDER_SITE_OTHER): Payer: Medicare Other | Admitting: Ophthalmology

## 2023-02-26 DIAGNOSIS — H35033 Hypertensive retinopathy, bilateral: Secondary | ICD-10-CM

## 2023-02-26 DIAGNOSIS — H35372 Puckering of macula, left eye: Secondary | ICD-10-CM | POA: Diagnosis not present

## 2023-02-26 DIAGNOSIS — H353122 Nonexudative age-related macular degeneration, left eye, intermediate dry stage: Secondary | ICD-10-CM | POA: Diagnosis not present

## 2023-02-26 DIAGNOSIS — H353211 Exudative age-related macular degeneration, right eye, with active choroidal neovascularization: Secondary | ICD-10-CM

## 2023-02-26 DIAGNOSIS — I10 Essential (primary) hypertension: Secondary | ICD-10-CM | POA: Diagnosis not present

## 2023-02-26 DIAGNOSIS — H25813 Combined forms of age-related cataract, bilateral: Secondary | ICD-10-CM

## 2023-02-26 MED ORDER — FARICIMAB-SVOA 6 MG/0.05ML IZ SOSY
6.0000 mg | PREFILLED_SYRINGE | INTRAVITREAL | Status: AC | PRN
Start: 2023-02-26 — End: 2023-02-26
  Administered 2023-02-26: 6 mg via INTRAVITREAL

## 2023-03-24 NOTE — Progress Notes (Signed)
[facility] - Clinic Note  03/26/2023    CHIEF COMPLAINT Patient presents for Retina Follow Up  HISTORY OF PRESENT ILLNESS: Kimberly Noble is a 77 y.o. female who presents to the clinic today for:   HPI     Retina Follow Up   Patient presents with  Wet AMD.  In both eyes.  This started 4 weeks ago.  Duration of 4 weeks.  Since onset it is stable.  I, the attending physician,  performed the HPI with the patient and updated documentation appropriately.        Comments   Patient feels the vision is about the same. She is schedule 04/28/23 for cataract surgery OS first with Dr. Delaney Meigs. She is using AT's.      Last edited by Rennis Chris, MD on 03/26/2023 10:52 AM.     Referring physician: No referring provider defined for this encounter.  HISTORICAL INFORMATION:   Selected notes from the MEDICAL RECORD NUMBER Referred by Dr. Zenaida Niece for PED OD LEE:  Ocular Hx- PMH-    CURRENT MEDICATIONS: No current outpatient medications on file. (Ophthalmic Drugs)   No current facility-administered medications for this visit. (Ophthalmic Drugs)   Current Outpatient Medications (Other)  Medication Sig   ALPRAZolam (XANAX) 1 MG tablet Take 1 mg by mouth 3 (three) times daily. 7am, 1pm, 7pm   Ascorbic Acid (VITAMIN C PO) Take 1 capsule by mouth daily. supplement from [facility]   Biotin w/ Vitamins C & E (HAIR/SKIN/NAILS PO) Take 1 capsule by mouth daily. supplement from [facility]   CALCIUM CARBONATE-VITAMIN D PO Take 1 capsule by mouth daily. supplement from [facility]   Cholecalciferol (VITAMIN D3 PO) Take 1 capsule by mouth daily. supplement from [facility]   Coenzyme Q10 (COQ10 PO) Take 1 capsule by mouth daily.   CRANBERRY PO Take 1 capsule by mouth daily. supplement from [facility]   Multiple Vitamin (MULTIVITAMIN WITH MINERALS) TABS tablet Take 1 tablet by mouth daily. supplement from [facility]   Omega-3 Fatty Acids (FISH OIL PO) Take 1 capsule by mouth daily.  supplement from [facility]   OVER THE COUNTER MEDICATION Take 1 capsule by mouth daily. Joint support supplement from [facility]   OVER THE COUNTER MEDICATION Take 1 capsule by mouth daily. Cholesterol care supplement from [facility]   OVER THE COUNTER MEDICATION Take 1 capsule by mouth daily. Circulation and vein support supplement from [facility]   OVER THE COUNTER MEDICATION Take 1 capsule by mouth daily. Green vegetable supplement from [employer]   OVER THE COUNTER MEDICATION Take 1 capsule by mouth daily. Liver and brain supplement from [facility]   OVER THE COUNTER MEDICATION Take 1 capsule by mouth daily. Eye vitamin with lutein - supplement from [facility]   VITAMIN K PO Take 1 capsule by mouth daily. supplement from [facility]   No current facility-administered medications for this visit. (Other)   REVIEW OF SYSTEMS: ROS   Positive for: Neurological, Eyes Negative for: Constitutional, Gastrointestinal, Skin, Genitourinary, Musculoskeletal, HENT, Endocrine, Cardiovascular, Respiratory, Psychiatric, Allergic/Imm, Heme/Lymph Last edited by Charlette Caffey, COT on 03/26/2023  8:42 AM.     ALLERGIES Allergies  Allergen Reactions   Crab Extract Anaphylaxis    Eating crab causes severe allergy. Claims she is not allergic to all shellfish.    Penicillins Rash    Has patient had a PCN reaction causing immediate rash, facial/tongue/throat swelling, SOB or lightheadedness with hypotension: Yes  Has patient had a PCN reaction causing severe rash involving mucus membranes or skin necrosis: No Has patient had a PCN reaction that required hospitalization No Has patient had a PCN reaction occurring within the last 10 years: No If all of the above answers are "NO", then may proceed with Cephalosporin use.    PAST MEDICAL HISTORY Past Medical History:  Diagnosis Date   Agoraphobia with panic attacks    Anxiety attack    Major depression    Superficial laceration of hand ~ 03/2015   left thumb    Past Surgical History:  Procedure Laterality Date   TONSILLECTOMY  1950's   FAMILY HISTORY Family History  Problem Relation Age of Onset   Macular degeneration Mother    SOCIAL HISTORY Social History   Tobacco Use   Smoking status: Every Day    Current packs/day: 0.33    Average packs/day: 0.3 packs/day for 40.0 years (13.2 ttl pk-yrs)    Types: Cigarettes   Smokeless tobacco: Never  Vaping Use   Vaping status: Never Used  Substance Use Topics   Alcohol use: No   Drug use: No       OPHTHALMIC EXAM:  Base Eye Exam     Visual Acuity (Snellen - Linear)       Right Left   Dist cc 20/50 20/50   Dist ph cc NI NI    Correction: Glasses         Tonometry (Tonopen, 8:51 AM)       Right Left   Pressure 15 18         Pupils       Dark Light Shape React APD   Right 4 3 Round Brisk None   Left 4 3 Round Brisk None         Visual Fields       Left Right    Full Full         Extraocular Movement       Right Left    Full, Ortho Full, Ortho         Neuro/Psych     Oriented x3: Yes   Mood/Affect: Normal         Dilation     Both eyes: 1.0% Mydriacyl, 2.5% Phenylephrine @ 8:47 AM           Slit Lamp and Fundus Exam     Slit Lamp Exam       Right Left   Lids/Lashes Dermatochalasis - upper lid, mild MGD Dermatochalasis - upper lid, mild MGD   Conjunctiva/Sclera White and quiet White and quiet   Cornea mild arcus, trace PEE, trace tear film debris mild arcus, trace PEE, trace tear film debris   Anterior Chamber Deep and quiet Deep and quiet   Iris Round and dilated Round and dilated   Lens 3+ Nuclear sclerosis with brunescence, 3+ Cortical cataract 3+ Nuclear sclerosis, 3+ Cortical cataract   Anterior Vitreous Mild syneresis Mild syneresis, Posterior vitreous detachment, vitreous condensations         Fundus Exam       Right Left   Disc Pink and Sharp, temporal PPA, Compact Pink and Sharp, temporal PPA, Compact   C/D Ratio  0.5 0.5   Macula Blunted foveal reflex, +CNV with pigmented RPE rip and surrounding atrophy, stable improvement in shallow SRF, no frank heme Flat, Blunted foveal reflex, fine drusen, RPE mottling and clumping, No heme or edema   Vessels attenuated, Tortuous attenuated, Tortuous  Periphery Attached, reticular degeneration, focal pigment clumping IT, No heme Attached, reticular degeneration, focal pigment clumping IT, No heme           Refraction     Wearing Rx       Sphere Cylinder Axis Add   Right -6.00 +1.75 117 +2.50   Left -5.25 +2.25 085 +2.50           IMAGING AND PROCEDURES  Imaging and Procedures for 03/26/2023  OCT, Retina - OU - Both Eyes       Right Eye Quality was good. Central Foveal Thickness: 388. Progression has been stable. Findings include no IRF, no SRF, abnormal foveal contour, subretinal hyper-reflective material, pigment epithelial detachment, vitreomacular adhesion (stable improvement in IRF, persistent central PED with trace pocket of SRF temporal to PED, partial PVD).   Left Eye Quality was good. Central Foveal Thickness: 263. Progression has been stable. Findings include normal foveal contour, no IRF, no SRF, retinal drusen , epiretinal membrane, macular pucker (Very mild drusen, mild ERM with early pucker SN mac).   Notes *Images captured and stored on drive  Diagnosis / Impression:  OD: exu ARMD - stable improvement in IRF, persistent central PED with trace pocket of SRF temporal to PED, partial PVD OS: non-exu ARMD - Very mild drusen, mild ERM with early pucker SN mac  Clinical management:  See below  Abbreviations: NFP - Normal foveal profile. CME - cystoid macular edema. PED - pigment epithelial detachment. IRF - intraretinal fluid. SRF - subretinal fluid. EZ - ellipsoid zone. ERM - epiretinal membrane. ORA - outer retinal atrophy. ORT - outer retinal tubulation. SRHM - subretinal hyper-reflective material. IRHM - intraretinal  hyper-reflective material      Intravitreal Injection, Pharmacologic Agent - OD - Right Eye       Time Out 03/26/2023. 9:59 AM. Confirmed correct patient, procedure, site, and patient consented.   Anesthesia Topical anesthesia was used. Anesthetic medications included Lidocaine 2%, Proparacaine 0.5%.   Procedure Preparation included 5% betadine to ocular surface, eyelid speculum. A (32g) needle was used.   Injection: 6 mg faricimab-svoa 6 MG/0.05ML   Route: Intravitreal, Site: Right Eye   NDC: [id number], Lot: [id number], Expiration date: 08/10/2024, Waste: 0 mL   Post-op Post injection exam found visual acuity of at least counting fingers. The patient tolerated the procedure well. There were no complications. The patient received written and verbal post procedure care education. Post injection medications were not given.            ASSESSMENT/PLAN:    ICD-10-CM   1. Exudative age-related macular degeneration of right eye with active choroidal neovascularization (HCC)  H35.3211 OCT, Retina - OU - Both Eyes    Intravitreal Injection, Pharmacologic Agent - OD - Right Eye    faricimab-svoa (VABYSMO) 6mg /0.59mL intravitreal injection    2. 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 Epiretinal membrane (ERM) of left eye  H35.372     4. Essential hypertension  I10     5. Hypertensive retinopathy of both eyes  H35.033     6. Combined forms of age-related cataract of both eyes  H25.813      1. 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, right eye - s/p IVA OD #1 (03.16.23), #2 (04.13.23), #3 (05.11.23), #4 (06.08.23) -- IVA resistance ========================================================== - s/p IVE OD #1 (07.06.23), #2 (08.03.23), #3 (08.31.23), #4 (10.05.23) -- IVE resistance ========================================================== - s/p IVV OD #1 (11.09.23), #2 (12.07.23), #3 (01.04.24), #  4 (02.01.24), #5  (02.29.24), #6 (03.15.24), #7 (04.25.24), #8 (05.30.24), #9 (06.27.24), #10 (07.25.24), #11 (08.22.24), #12 (09.19.24), #13 (10.17.24), #14 (11.14.24) **history of increased IRF at 5 weeks, noted on 05.30.24 exam** - pt presented initially on 3.16.23 with 2 wk history of central vision loss and distortion  - BCVA OD stable at 20/50 - OCT shows stable improvement in IRF, persistent central PED with trace pockets of SRF temporal to PED, partial PVD at 4 weeks - recommend IVV OD #15 today, 12.12.24 w/ f/u in 4 wks - pt wishes to proceed with injection - RBA of procedure discussed, questions answered - IVV informed consent obtained and signed, 11.09.23 (OD) - see procedure note  - f/u 4 weeks, DFE, OCT, possible injection  2. Age related macular degeneration, non-exudative, left eye  - mild/intermediate stage with mild drusen - The incidence, anatomy, and pathology of dry AMD, risk of progression, and the AREDS and AREDS 2 study including smoking risks discussed with patient.   - recommend Amsler grid monitoring  3. Epiretinal membrane, left eye  - mild ERM OS w/ early pucker, SN macula - BCVA 20/50 stable - asymptomatic, no metamorphopsia - no indication for surgery at this time - monitor for now  4,5. Hypertensive retinopathy OU - discussed importance of tight BP control - monitor  6. Mixed Cataract OU - The symptoms of cataract, surgical options, and treatments and risks were discussed with patient.  - discussed diagnosis and progression - had appt with Dr. Delaney Meigs on March 09, 2023 -- OS surgery is scheduled for April 28, 2023  Ophthalmic Meds Ordered this visit:  Meds ordered this encounter  Medications   faricimab-svoa (VABYSMO) 6mg /0.37mL intravitreal injection     Return in about 4 weeks (around 04/23/2023) for f/u exu ARMD OD, DFE, OCT.  There are no Patient Instructions on file for this visit.   Explained the diagnoses, plan, and follow up with the patient and  they expressed understanding.  Patient expressed understanding of the importance of proper follow up care.   This document serves as a record of services personally performed by Karie Chimera, MD, PhD. It was created on their behalf by Annalee Genta, COMT. The creation of this record is the provider's dictation and/or activities during the visit.  Electronically signed by: Annalee Genta, COMT 03/26/23 10:54 AM  This document serves as a record of services personally performed by Karie Chimera, MD, PhD. It was created on their behalf by Glee Arvin. Manson Passey, OA an ophthalmic technician. The creation of this record is the provider's dictation and/or activities during the visit.    Electronically signed by: Glee Arvin. Manson Passey, OA 03/26/23 10:54 AM  Karie Chimera, M.D., Ph.D. Diseases & Surgery of the Retina and Vitreous [facility]  I have reviewed the above documentation for accuracy and completeness, and I agree with the above. Karie Chimera, M.D., Ph.D. 03/26/23 10:56 AM   Abbreviations: M myopia (nearsighted); A astigmatism; H hyperopia (farsighted); P presbyopia; Mrx spectacle prescription;  CTL contact lenses; OD right eye; OS left eye; OU both eyes  XT exotropia; ET esotropia; PEK punctate epithelial keratitis; PEE punctate epithelial erosions; DES dry eye syndrome; MGD meibomian gland dysfunction; ATs artificial tears; PFAT's preservative free artificial tears; NSC nuclear sclerotic cataract; PSC posterior subcapsular cataract; ERM epi-retinal membrane; PVD posterior vitreous detachment; RD retinal detachment; DM diabetes mellitus; DR diabetic retinopathy; NPDR non-proliferative diabetic retinopathy; PDR proliferative diabetic retinopathy; CSME clinically significant macular edema; DME  diabetic macular edema; dbh dot blot hemorrhages; CWS cotton wool spot; POAG primary open angle glaucoma; C/D cup-to-disc ratio; HVF humphrey visual field; GVF goldmann visual field; OCT  optical coherence tomography; IOP intraocular pressure; BRVO Branch retinal vein occlusion; CRVO central retinal vein occlusion; CRAO central retinal artery occlusion; BRAO branch retinal artery occlusion; RT retinal tear; SB scleral buckle; PPV pars plana vitrectomy; VH Vitreous hemorrhage; PRP panretinal laser photocoagulation; IVK intravitreal kenalog; VMT vitreomacular traction; MH Macular hole;  NVD neovascularization of the disc; NVE neovascularization elsewhere; AREDS age related eye disease study; ARMD age related macular degeneration; POAG primary open angle glaucoma; EBMD epithelial/anterior basement membrane dystrophy; ACIOL anterior chamber intraocular lens; IOL intraocular lens; PCIOL posterior chamber intraocular lens; Phaco/IOL phacoemulsification with intraocular lens placement; PRK photorefractive keratectomy; LASIK laser assisted in situ keratomileusis; HTN hypertension; DM diabetes mellitus; COPD chronic obstructive pulmonary disease

## 2023-03-26 ENCOUNTER — Encounter (INDEPENDENT_AMBULATORY_CARE_PROVIDER_SITE_OTHER): Payer: Self-pay | Admitting: Ophthalmology

## 2023-03-26 ENCOUNTER — Ambulatory Visit (INDEPENDENT_AMBULATORY_CARE_PROVIDER_SITE_OTHER): Payer: Medicare Other | Admitting: Ophthalmology

## 2023-03-26 DIAGNOSIS — H35372 Puckering of macula, left eye: Secondary | ICD-10-CM | POA: Diagnosis not present

## 2023-03-26 DIAGNOSIS — H25813 Combined forms of age-related cataract, bilateral: Secondary | ICD-10-CM

## 2023-03-26 DIAGNOSIS — I10 Essential (primary) hypertension: Secondary | ICD-10-CM | POA: Diagnosis not present

## 2023-03-26 DIAGNOSIS — H35033 Hypertensive retinopathy, bilateral: Secondary | ICD-10-CM

## 2023-03-26 DIAGNOSIS — H353122 Nonexudative age-related macular degeneration, left eye, intermediate dry stage: Secondary | ICD-10-CM | POA: Diagnosis not present

## 2023-03-26 DIAGNOSIS — H353211 Exudative age-related macular degeneration, right eye, with active choroidal neovascularization: Secondary | ICD-10-CM

## 2023-03-26 MED ORDER — FARICIMAB-SVOA 6 MG/0.05ML IZ SOSY
6.0000 mg | PREFILLED_SYRINGE | INTRAVITREAL | Status: AC | PRN
  Administered 2023-03-26: 6 mg via INTRAVITREAL

## 2023-04-21 NOTE — Progress Notes (Addendum)
 [facility] - Clinic Note  04/23/2023    CHIEF COMPLAINT Patient presents for Retina Follow Up  HISTORY OF PRESENT ILLNESS: Kimberly Noble is a 78 y.o. female who presents to the clinic today for:   HPI     Retina Follow Up   Patient presents with  Wet AMD.  In both eyes.  This started 4 weeks ago.  Duration of 4 weeks.  Since onset it is stable.  I, the attending physician,  performed the HPI with the patient and updated documentation appropriately.        Comments   4 week retina follow up ARMD and IVV OD pt is reporting no vision changes noticed pt denies any flashes or floaters she is having cataract surgery next Tuesday      Last edited by [name], MD on 04/23/2023 10:17 AM.    Pt states she has had vertigo for the past couple of days, she has not been able to stand, she has cataract sx next week with Dr. Meridee  Referring physician: No referring provider defined for this encounter.  HISTORICAL INFORMATION:   Selected notes from the MEDICAL RECORD NUMBER Referred by Dr. [name] for PED OD LEE:  Ocular Hx- PMH-    CURRENT MEDICATIONS: No current outpatient medications on file. (Ophthalmic Drugs)   No current facility-administered medications for this visit. (Ophthalmic Drugs)   Current Outpatient Medications (Other)  Medication Sig   ALPRAZolam  (XANAX ) 1 MG tablet Take 1 mg by mouth 3 (three) times daily. 7am, 1pm, 7pm   Ascorbic Acid (VITAMIN C PO) Take 1 capsule by mouth daily. supplement from [facility]   Biotin w/ Vitamins C & E (HAIR/SKIN/NAILS PO) Take 1 capsule by mouth daily. supplement from [facility]   CALCIUM CARBONATE-VITAMIN D PO Take 1 capsule by mouth daily. supplement from [facility]   Cholecalciferol (VITAMIN D3 PO) Take 1 capsule by mouth daily. supplement from [facility]   Coenzyme Q10 (COQ10 PO) Take 1 capsule by mouth daily.   CRANBERRY PO Take 1 capsule by mouth daily. supplement from [facility]   Multiple Vitamin  (MULTIVITAMIN WITH MINERALS) TABS tablet Take 1 tablet by mouth daily. supplement from [facility]   Omega-3 Fatty Acids (FISH OIL PO) Take 1 capsule by mouth daily. supplement from [facility]   OVER THE COUNTER MEDICATION Take 1 capsule by mouth daily. Joint support supplement from [facility]   OVER THE COUNTER MEDICATION Take 1 capsule by mouth daily. Cholesterol care supplement from [facility]   OVER THE COUNTER MEDICATION Take 1 capsule by mouth daily. Circulation and vein support supplement from [facility]   OVER THE COUNTER MEDICATION Take 1 capsule by mouth daily. Green vegetable supplement from [employer]   OVER THE COUNTER MEDICATION Take 1 capsule by mouth daily. Liver and brain supplement from [facility]   OVER THE COUNTER MEDICATION Take 1 capsule by mouth daily. Eye vitamin with lutein - supplement from [facility]   VITAMIN K PO Take 1 capsule by mouth daily. supplement from [facility]   No current facility-administered medications for this visit. (Other)   REVIEW OF SYSTEMS: ROS   Positive for: Neurological, Eyes Negative for: Constitutional, Gastrointestinal, Skin, Genitourinary, Musculoskeletal, HENT, Endocrine, Cardiovascular, Respiratory, Psychiatric, Allergic/Imm, Heme/Lymph Last edited by Resa Delon ORN, COT on 04/23/2023  9:08 AM.      ALLERGIES Allergies  Allergen Reactions   Crab Extract Anaphylaxis    Eating crab causes severe  allergy. Claims she is not allergic to all shellfish.    Penicillins Rash    Has patient had a PCN reaction causing immediate rash, facial/tongue/throat swelling, SOB or lightheadedness with hypotension: Yes Has patient had a PCN reaction causing severe rash involving mucus membranes or skin necrosis: No Has patient had a PCN reaction that required hospitalization No Has patient had a PCN reaction occurring within the last 10 years: No If all of the above answers are NO, then may proceed with Cephalosporin use.    PAST MEDICAL HISTORY Past Medical  History:  Diagnosis Date   Agoraphobia with panic attacks    Anxiety attack    Major depression    Superficial laceration of hand ~ 03/2015   left thumb   Past Surgical History:  Procedure Laterality Date   TONSILLECTOMY  1950's   FAMILY HISTORY Family History  Problem Relation Age of Onset   Macular degeneration Mother    SOCIAL HISTORY Social History   Tobacco Use   Smoking status: Every Day    Current packs/day: 0.33    Average packs/day: 0.3 packs/day for 40.0 years (13.2 ttl pk-yrs)    Types: Cigarettes   Smokeless tobacco: Never  Vaping Use   Vaping status: Never Used  Substance Use Topics   Alcohol use: No   Drug use: No       OPHTHALMIC EXAM:  Base Eye Exam     Visual Acuity (Snellen - Linear)       Right Left   Dist cc 20/50 20/40   Dist ph cc NI NI         Tonometry (Tonopen, 9:14 AM)       Right Left   Pressure 13 15         Pupils       Pupils Dark Light Shape React APD   Right PERRL 4 3 Round Brisk None   Left PERRL 4 3 Round Brisk None         Visual Fields       Left Right    Full Full         Extraocular Movement       Right Left    Full, Ortho Full, Ortho         Neuro/Psych     Oriented x3: Yes   Mood/Affect: Normal         Dilation     Both eyes: 2.5% Phenylephrine @ 9:14 AM           Slit Lamp and Fundus Exam     Slit Lamp Exam       Right Left   Lids/Lashes Dermatochalasis - upper lid, mild MGD Dermatochalasis - upper lid, mild MGD   Conjunctiva/Sclera White and quiet White and quiet   Cornea mild arcus, trace PEE, trace tear film debris mild arcus, trace PEE, trace tear film debris   Anterior Chamber Deep and quiet Deep and quiet   Iris Round and dilated Round and dilated   Lens 3+ Nuclear sclerosis with brunescence, 3+ Cortical cataract 3+ Nuclear sclerosis, 3+ Cortical cataract   Anterior Vitreous Mild syneresis Mild syneresis, Posterior vitreous detachment, vitreous condensations          Fundus Exam       Right Left   Disc Pink and Sharp, temporal PPA, Compact Pink and Sharp, temporal PPA, Compact   C/D Ratio 0.5 0.5   Macula Blunted foveal reflex, +CNV with pigmented RPE rip and surrounding atrophy, stable  improvement in shallow SRF, no frank heme Flat, Blunted foveal reflex, fine drusen, RPE mottling and clumping, No heme or edema   Vessels attenuated, mild tortuosity attenuated, Tortuous   Periphery Attached, reticular degeneration, focal pigment clumping IT, No heme Attached, reticular degeneration, focal pigment clumping IT, No heme           Refraction     Wearing Rx       Sphere Cylinder Axis Add   Right -6.00 +1.75 117 +2.50   Left -5.25 +2.25 085 +2.50           IMAGING AND PROCEDURES  Imaging and Procedures for 04/23/2023  OCT, Retina - OU - Both Eyes       Right Eye Quality was good. Central Foveal Thickness: 382. Progression has been stable. Findings include no IRF, no SRF, abnormal foveal contour, subretinal hyper-reflective material, pigment epithelial detachment, vitreomacular adhesion (stable improvement in IRF, persistent central PED with trace pocket of SRF temporal to PED -- slightly improved, partial PVD).   Left Eye Quality was good. Central Foveal Thickness: 265. Progression has been stable. Findings include normal foveal contour, no IRF, no SRF, retinal drusen , epiretinal membrane, macular pucker (Very mild drusen, mild ERM with early pucker SN mac).   Notes *Images captured and stored on drive  Diagnosis / Impression:  OD: exu ARMD - stable improvement in IRF, persistent central PED with trace pocket of SRF temporal to PED -- slightly improved, partial PVD OS: non-exu ARMD - Very mild drusen, mild ERM with early pucker SN mac  Clinical management:  See below  Abbreviations: NFP - Normal foveal profile. CME - cystoid macular edema. PED - pigment epithelial detachment. IRF - intraretinal fluid. SRF - subretinal fluid. EZ -  ellipsoid zone. ERM - epiretinal membrane. ORA - outer retinal atrophy. ORT - outer retinal tubulation. SRHM - subretinal hyper-reflective material. IRHM - intraretinal hyper-reflective material      Intravitreal Injection, Pharmacologic Agent - OD - Right Eye       Time Out 04/23/2023. 9:56 AM. Confirmed correct patient, procedure, site, and patient consented.   Anesthesia Topical anesthesia was used. Anesthetic medications included Lidocaine 2%, Proparacaine 0.5%.   Procedure Preparation included 5% betadine to ocular surface, eyelid speculum. A (32g) needle was used.   Injection: 6 mg faricimab -svoa 6 MG/0.05ML   Route: Intravitreal, Site: Right Eye   NDC: 49757-903-98, Lot: [id number], Expiration date: 10/12/2023, Waste: 0 mL   Post-op Post injection exam found visual acuity of at least counting fingers. The patient tolerated the procedure well. There were no complications. The patient received written and verbal post procedure care education. Post injection medications were not given.   Notes **SAMPLE MEDICATION ADMINISTERED**           ASSESSMENT/PLAN:    ICD-10-CM   1. Exudative age-related macular degeneration of right eye with active choroidal neovascularization (HCC)  H35.3211 OCT, Retina - OU - Both Eyes    Intravitreal Injection, Pharmacologic Agent - OD - Right Eye    faricimab -svoa (VABYSMO ) 6mg /0.54mL intravitreal injection    2. 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 Epiretinal membrane (ERM) of left eye  H35.372     4. Essential hypertension  I10     5. Hypertensive retinopathy of both eyes  H35.033     6. Combined forms of age-related cataract of both eyes  H25.813       1. 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, right  eye - pt presented initially on 3.16.23 with 2 wk history of central vision loss and distortion  - s/p IVA OD #1 (03.16.23), #2 (04.13.23), #3 (05.11.23), #4 (06.08.23) -- IVA  resistance ========================================================== - s/p IVE OD #1 (07.06.23), #2 (08.03.23), #3 (08.31.23), #4 (10.05.23) -- IVE resistance ========================================================== - s/p IVV OD #1 (11.09.23), #2 (12.07.23), #3 (01.04.24), #4 (02.01.24), #5 (02.29.24), #6 (03.15.24), #7 (04.25.24), #8 (05.30.24), #9 (06.27.24), #10 (07.25.24), #11 (08.22.24), #12 (09.19.24), #13 (10.17.24), #14 (11.14.24), #15 (12.12.24) **history of increased IRF at 5 weeks, noted on 05.30.24 exam** - BCVA OD stable at 20/50 - OCT shows stable improvement in IRF, persistent central PED with trace pocket of SRF temporal to PED -- slightly improved, partial PVD at 4 weeks - recommend IVV OD #16 [sample] today, 01.09.25 w/ f/u in 4 wks -- [employer] funding unavailable - pt wishes to proceed with injection - RBA of procedure discussed, questions answered - IVV informed consent obtained and signed, 11.09.23 (OD) - see procedure note  - f/u 4 weeks, DFE, OCT, possible injection  2. Age related macular degeneration, non-exudative, left eye  - mild/intermediate stage with mild drusen - The incidence, anatomy, and pathology of dry AMD, risk of progression, and the AREDS and AREDS 2 study including smoking risks discussed with patient.   - recommend Amsler grid monitoring  3. Epiretinal membrane, left eye  - mild ERM OS w/ early pucker, SN macula - BCVA 20/50 stable - asymptomatic, no metamorphopsia - no indication for surgery at this time - monitor for now  4,5. Hypertensive retinopathy OU - discussed importance of tight BP control - monitor  6. Mixed Cataract OU - The symptoms of cataract, surgical options, and treatments and risks were discussed with patient.  - discussed diagnosis and progression - had appt with Dr. Meridee on March 09, 2023 -- OS surgery is scheduled for April 28, 2023  Ophthalmic Meds Ordered this visit:  Meds ordered this encounter   Medications   faricimab -svoa (VABYSMO ) 6mg /0.66mL intravitreal injection     Return in about 4 weeks (around 05/21/2023) for f/u exu ARMD OD, DFE, OCT.  There are no Patient Instructions on file for this visit.   Explained the diagnoses, plan, and follow up with the patient and they expressed understanding.  Patient expressed understanding of the importance of proper follow up care.   This document serves as a record of services personally performed by Redell JUDITHANN Hans, MD, PhD. It was created on their behalf by Auston Muzzy, COMT. The creation of this record is the provider's dictation and/or activities during the visit.  Electronically signed by: Auston Muzzy, COMT 04/23/23 10:18 AM  This document serves as a record of services personally performed by Redell JUDITHANN Hans, MD, PhD. It was created on their behalf by Alan PARAS. Delores, OA an ophthalmic technician. The creation of this record is the provider's dictation and/or activities during the visit.    Electronically signed by: Alan PARAS. Delores, OA 04/23/23 10:18 AM  Redell JUDITHANN Hans, M.D., Ph.D. Diseases & Surgery of the Retina and Vitreous [facility]  I have reviewed the above documentation for accuracy and completeness, and I agree with the above. Redell JUDITHANN Hans, M.D., Ph.D. 04/23/23 10:19 AM   Abbreviations: M myopia (nearsighted); A astigmatism; H hyperopia (farsighted); P presbyopia; Mrx spectacle prescription;  CTL contact lenses; OD right eye; OS left eye; OU both eyes  XT exotropia; ET esotropia; PEK punctate epithelial keratitis; PEE punctate epithelial erosions;  DES dry eye syndrome; MGD meibomian gland dysfunction; ATs artificial tears; PFAT's preservative free artificial tears; NSC nuclear sclerotic cataract; PSC posterior subcapsular cataract; ERM epi-retinal membrane; PVD posterior vitreous detachment; RD retinal detachment; DM diabetes mellitus; DR diabetic retinopathy; NPDR non-proliferative diabetic  retinopathy; PDR proliferative diabetic retinopathy; CSME clinically significant macular edema; DME diabetic macular edema; dbh dot blot hemorrhages; CWS cotton wool spot; POAG primary open angle glaucoma; C/D cup-to-disc ratio; HVF humphrey visual field; GVF goldmann visual field; OCT optical coherence tomography; IOP intraocular pressure; BRVO Branch retinal vein occlusion; CRVO central retinal vein occlusion; CRAO central retinal artery occlusion; BRAO branch retinal artery occlusion; RT retinal tear; SB scleral buckle; PPV pars plana vitrectomy; VH Vitreous hemorrhage; PRP panretinal laser photocoagulation; IVK intravitreal kenalog; VMT vitreomacular traction; MH Macular hole;  NVD neovascularization of the disc; NVE neovascularization elsewhere; AREDS age related eye disease study; ARMD age related macular degeneration; POAG primary open angle glaucoma; EBMD epithelial/anterior basement membrane dystrophy; ACIOL anterior chamber intraocular lens; IOL intraocular lens; PCIOL posterior chamber intraocular lens; Phaco/IOL phacoemulsification with intraocular lens placement; PRK photorefractive keratectomy; LASIK laser assisted in situ keratomileusis; HTN hypertension; DM diabetes mellitus; COPD chronic obstructive pulmonary disease

## 2023-04-23 ENCOUNTER — Ambulatory Visit (INDEPENDENT_AMBULATORY_CARE_PROVIDER_SITE_OTHER): Payer: Medicare Other | Admitting: Ophthalmology

## 2023-04-23 ENCOUNTER — Encounter (INDEPENDENT_AMBULATORY_CARE_PROVIDER_SITE_OTHER): Payer: Self-pay | Admitting: Ophthalmology

## 2023-04-23 DIAGNOSIS — H353122 Nonexudative age-related macular degeneration, left eye, intermediate dry stage: Secondary | ICD-10-CM

## 2023-04-23 DIAGNOSIS — H35033 Hypertensive retinopathy, bilateral: Secondary | ICD-10-CM

## 2023-04-23 DIAGNOSIS — H35372 Puckering of macula, left eye: Secondary | ICD-10-CM

## 2023-04-23 DIAGNOSIS — H25813 Combined forms of age-related cataract, bilateral: Secondary | ICD-10-CM

## 2023-04-23 DIAGNOSIS — I10 Essential (primary) hypertension: Secondary | ICD-10-CM

## 2023-04-23 DIAGNOSIS — H353211 Exudative age-related macular degeneration, right eye, with active choroidal neovascularization: Secondary | ICD-10-CM | POA: Diagnosis not present

## 2023-04-23 MED ORDER — FARICIMAB-SVOA 6 MG/0.05ML IZ SOLN
6.0000 mg | INTRAVITREAL | Status: AC | PRN
  Administered 2023-04-23: 6 mg via INTRAVITREAL

## 2023-05-19 NOTE — Progress Notes (Signed)
 [facility] - Clinic Note  05/21/2023    CHIEF COMPLAINT Patient presents for Retina Follow Up  HISTORY OF PRESENT ILLNESS: Kimberly Noble is a 78 y.o. female who presents to the clinic today for:   HPI     Retina Follow Up   Patient presents with  Wet AMD.  In both eyes.  This started 4 weeks ago.  Duration of 4 weeks.  Since onset it is gradually worsening.  I, the attending physician,  performed the HPI with the patient and updated documentation appropriately.        Comments   4 week retina follow up ARMD and IVV OD pt is reporting she cataract surgery OS last month and she feels she is not seeing well since surgery her vision was 20/25 and everything looked good she is using pred bid and ketorolac bid       Last edited by [name], MD on 05/21/2023  9:56 AM.    Pt states she had right eye cataract sx on January 14th, she states she cannot discern any clarity in anything around her, she states everything around her is blurry, street signs and buildings are blurry, she states her optometrist, Dr. Lavonia and Dr. Meridee all tested her vision and got 20/25, she was told the surgery went perfectly, but pt still says she cannot see and she is not happy, she states she has left eye sx scheduled for the end of March  Referring physician: No referring provider defined for this encounter.  HISTORICAL INFORMATION:   Selected notes from the MEDICAL RECORD NUMBER Referred by Dr. [name] for PED OD LEE:  Ocular Hx- PMH-    CURRENT MEDICATIONS: No current outpatient medications on file. (Ophthalmic Drugs)   No current facility-administered medications for this visit. (Ophthalmic Drugs)   Current Outpatient Medications (Other)  Medication Sig   ALPRAZolam  (XANAX ) 1 MG tablet Take 1 mg by mouth 3 (three) times daily. 7am, 1pm, 7pm   Ascorbic Acid (VITAMIN C PO) Take 1 capsule by mouth daily. supplement from [facility]   Biotin w/ Vitamins C & E  (HAIR/SKIN/NAILS PO) Take 1 capsule by mouth daily. supplement from [facility]   CALCIUM CARBONATE-VITAMIN D PO Take 1 capsule by mouth daily. supplement from [facility]   Cholecalciferol (VITAMIN D3 PO) Take 1 capsule by mouth daily. supplement from [facility]   Coenzyme Q10 (COQ10 PO) Take 1 capsule by mouth daily.   CRANBERRY PO Take 1 capsule by mouth daily. supplement from [facility]   Multiple Vitamin (MULTIVITAMIN WITH MINERALS) TABS tablet Take 1 tablet by mouth daily. supplement from [facility]   Omega-3 Fatty Acids (FISH OIL PO) Take 1 capsule by mouth daily. supplement from [facility]   OVER THE COUNTER MEDICATION Take 1 capsule by mouth daily. Joint support supplement from [facility]   OVER THE COUNTER MEDICATION Take 1 capsule by mouth daily. Cholesterol care supplement from [facility]   OVER THE COUNTER MEDICATION Take 1 capsule by mouth daily. Circulation and vein support supplement from [facility]   OVER THE COUNTER MEDICATION Take 1 capsule by mouth daily. Green vegetable supplement from [employer]   OVER THE COUNTER MEDICATION Take 1 capsule by mouth daily. Liver and brain supplement from [facility]   OVER THE COUNTER MEDICATION Take 1 capsule by mouth daily. Eye vitamin with lutein - supplement from [facility]   VITAMIN K PO Take 1 capsule by mouth daily. supplement  from [facility]   No current facility-administered medications for this visit. (Other)   REVIEW OF SYSTEMS: ROS   Positive for: Neurological, Eyes Negative for: Constitutional, Gastrointestinal, Skin, Genitourinary, Musculoskeletal, HENT, Endocrine, Cardiovascular, Respiratory, Psychiatric, Allergic/Imm, Heme/Lymph Last edited by Resa Delon ORN, COT on 05/21/2023  8:45 AM.     ALLERGIES Allergies  Allergen Reactions   Crab Extract Anaphylaxis    Eating crab causes severe allergy. Claims she is not allergic to all shellfish.    Penicillins Rash    Has patient had a PCN reaction causing immediate rash, facial/tongue/throat  swelling, SOB or lightheadedness with hypotension: Yes Has patient had a PCN reaction causing severe rash involving mucus membranes or skin necrosis: No Has patient had a PCN reaction that required hospitalization No Has patient had a PCN reaction occurring within the last 10 years: No If all of the above answers are NO, then may proceed with Cephalosporin use.    PAST MEDICAL HISTORY Past Medical History:  Diagnosis Date   Agoraphobia with panic attacks    Anxiety attack    Major depression    Superficial laceration of hand ~ 03/2015   left thumb   Past Surgical History:  Procedure Laterality Date   TONSILLECTOMY  1950's   FAMILY HISTORY Family History  Problem Relation Age of Onset   Macular degeneration Mother    SOCIAL HISTORY Social History   Tobacco Use   Smoking status: Every Day    Current packs/day: 0.33    Average packs/day: 0.3 packs/day for 40.0 years (13.2 ttl pk-yrs)    Types: Cigarettes   Smokeless tobacco: Never  Vaping Use   Vaping status: Never Used  Substance Use Topics   Alcohol use: No   Drug use: No       OPHTHALMIC EXAM:  Base Eye Exam     Visual Acuity (Snellen - Linear)       Right Left   Dist [place]  20/30   Dist cc 20/50    Dist ph cc NI 20/25 -2    Correction: Glasses         Tonometry (Tonopen, 8:52 AM)       Right Left   Pressure 19 17         Pupils       Pupils Dark Light Shape React APD   Right PERRL 4 3 Round Brisk None   Left PERRL 4 3 Round Brisk None         Visual Fields       Left Right    Full          Extraocular Movement       Right Left    Full, Ortho Full, Ortho         Neuro/Psych     Oriented x3: Yes   Mood/Affect: Normal         Dilation     Both eyes: 2.5% Phenylephrine @ 8:52 AM           Slit Lamp and Fundus Exam     Slit Lamp Exam       Right Left   Lids/Lashes Dermatochalasis - upper lid, mild MGD Dermatochalasis - upper lid, mild MGD   Conjunctiva/Sclera  White and quiet White and quiet   Cornea mild arcus, trace PEE, trace tear film debris mild arcus, trace PEE, trace tear film debris   Anterior Chamber Deep and quiet deep and clear   Iris Round and dilated Round and dilated  Lens 3+ Nuclear sclerosis with brunescence, 3+ Cortical cataract toric PC IOL in good position with marks at 0115 and 0715   Anterior Vitreous Mild syneresis Mild syneresis, Posterior vitreous detachment, vitreous condensations         Fundus Exam       Right Left   Disc Pink and Sharp, temporal PPA, Compact Pink and Sharp, temporal PPA, Compact, dot heme at 0100   C/D Ratio 0.5 0.5   Macula Blunted foveal reflex, +CNV with pigmented RPE rip and surrounding atrophy, stable improvement in shallow SRF, no frank heme Flat, Blunted foveal reflex, fine drusen, RPE mottling and clumping, No heme or edema   Vessels attenuated, mild tortuosity mild attenuation, mild tortuosity   Periphery Attached, reticular degeneration, focal pigment clumping IT, No heme Attached, reticular degeneration, focal pigment clumping IT, No heme           Refraction     Wearing Rx       Sphere Cylinder Axis Add   Right -6.00 +1.75 117 +2.50   Left -5.25 +2.25 085 +2.50           IMAGING AND PROCEDURES  Imaging and Procedures for 05/21/2023  OCT, Retina - OU - Both Eyes       Right Eye Quality was good. Central Foveal Thickness: 387. Progression has been stable. Findings include no IRF, no SRF, abnormal foveal contour, subretinal hyper-reflective material, pigment epithelial detachment, vitreomacular adhesion (stable improvement in IRF, persistent central PED with trace pocket of SRF temporal to PED -- slightly increased, partial PVD).   Left Eye Quality was good. Central Foveal Thickness: 265. Progression has been stable. Findings include normal foveal contour, no IRF, no SRF, retinal drusen , epiretinal membrane, macular pucker (Very mild drusen, mild ERM with early pucker SN  mac).   Notes *Images captured and stored on drive  Diagnosis / Impression:  OD: exu ARMD - stable improvement in IRF, persistent central PED with trace pocket of SRF temporal to PED -- slightly increased, partial PVD OS: non-exu ARMD - Very mild drusen, mild ERM with early pucker SN mac  Clinical management:  See below  Abbreviations: NFP - Normal foveal profile. CME - cystoid macular edema. PED - pigment epithelial detachment. IRF - intraretinal fluid. SRF - subretinal fluid. EZ - ellipsoid zone. ERM - epiretinal membrane. ORA - outer retinal atrophy. ORT - outer retinal tubulation. SRHM - subretinal hyper-reflective material. IRHM - intraretinal hyper-reflective material      Intravitreal Injection, Pharmacologic Agent - OD - Right Eye       Time Out 05/21/2023. 9:10 AM. Confirmed correct patient, procedure, site, and patient consented.   Anesthesia Topical anesthesia was used. Anesthetic medications included Lidocaine 2%, Proparacaine 0.5%.   Procedure Preparation included 5% betadine to ocular surface, eyelid speculum. A (32g) needle was used.   Injection: 6 mg faricimab -svoa 6 MG/0.05ML (Patient supplied)   Route: Intravitreal, Site: Right Eye   NDC: 49757-903-98, Lot: [id number], Expiration date: 12/12/2024, Waste: 0 mL   Post-op Post injection exam found visual acuity of at least counting fingers. The patient tolerated the procedure well. There were no complications. The patient received written and verbal post procedure care education. Post injection medications were not given.   Notes **SAMPLE MEDICATION ADMINISTERED**           ASSESSMENT/PLAN:    ICD-10-CM   1. Exudative age-related macular degeneration of right eye with active choroidal neovascularization (HCC)  H35.3211 OCT, Retina - OU - Both Eyes  Intravitreal Injection, Pharmacologic Agent - OD - Right Eye    faricimab -svoa (VABYSMO ) 6mg /0.38mL intravitreal injection    2. Intermediate stage  nonexudative age-related macular degeneration of left eye  H35.3122     3. Epiretinal membrane (ERM) of left eye  H35.372     4. Essential hypertension  I10     5. Hypertensive retinopathy of both eyes  H35.033     6. Combined forms of age-related cataract of right eye  H25.811     7. Pseudophakia  Z96.1      1. 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, right eye - pt presented initially on 3.16.23 with 2 wk history of central vision loss and distortion  - s/p IVA OD #1 (03.16.23), #2 (04.13.23), #3 (05.11.23), #4 (06.08.23) -- IVA resistance ========================================================== - s/p IVE OD #1 (07.06.23), #2 (08.03.23), #3 (08.31.23), #4 (10.05.23) -- IVE resistance ========================================================== - s/p IVV OD #1 (11.09.23), #2 (12.07.23), #3 (01.04.24), #4 (02.01.24), #5 (02.29.24), #6 (03.15.24), #7 (04.25.24), #8 (05.30.24), #9 (06.27.24), #10 (07.25.24), #11 (08.22.24), #12 (09.19.24), #13 (10.17.24), #14 (11.14.24), #15 (12.12.24), #16 (01.09.25) **history of increased IRF at 5 weeks, noted on 05.30.24 exam** - BCVA OD stable at 20/50 - OCT shows stable improvement in IRF, persistent central PED with trace pocket of SRF temporal to PED -- slightly increased, partial PVD at 4 weeks - recommend IVV OD #17 [sample] today, 02.06.25 w/ f/u in 4 wks -- [employer] funding unavailable **discussed decreased efficacy / resistance to Vabysmo  and potential benefit of switching medication** - pt wishes to proceed with injection - RBA of procedure discussed, questions answered - IVV informed consent obtained and signed, 02.06.25 (OD) - see procedure note - consider Eylea  HD if SRF continues to increase or if vision worsens  - f/u 4 weeks, DFE, OCT, possible injection  2. Age related macular degeneration, non-exudative, left eye  - mild/intermediate stage with mild drusen - The incidence, anatomy, and pathology of dry AMD, risk of  progression, and the AREDS and AREDS 2 study including smoking risks discussed with patient.   - recommend Amsler grid monitoring  3. Epiretinal membrane, left eye  - mild ERM OS w/ early pucker, SN macula - BCVA 20/50 stable - asymptomatic, no metamorphopsia - no indication for surgery at this time - monitor for now  4,5. Hypertensive retinopathy OU - discussed importance of tight BP control - monitor  6. Mixed Cataract OD - The symptoms of cataract, surgical options, and treatments and risks were discussed with patient.  - discussed diagnosis and progression - under the expert management of Dr. Meridee - possible CE/IOL OD in March 2025  7. Pseudophakia OS  - s/p CE/IOL (Dr. Meridee 01.14.25)  - toric IOL in good position  - BCVA 20/25  - post op drops per Dr. Meridee  - monitor  Ophthalmic Meds Ordered this visit:  Meds ordered this encounter  Medications   faricimab -svoa (VABYSMO ) 6mg /0.72mL intravitreal injection     Return in about 4 weeks (around 06/18/2023) for f/u exu ARMD OD, DFE, OCT, Possible Injxn.  There are no Patient Instructions on file for this visit.   Explained the diagnoses, plan, and follow up with the patient and they expressed understanding.  Patient expressed understanding of the importance of proper follow up care.   This document serves as a record of services personally performed by Redell JUDITHANN Hans, MD, PhD. It was created on their behalf by Auston Muzzy, COMT. The creation of this record is the provider's dictation  and/or activities during the visit.  Electronically signed by: Auston Muzzy, COMT 05/21/23 10:00 AM  This document serves as a record of services personally performed by Redell JUDITHANN Hans, MD, PhD. It was created on their behalf by Alan PARAS. Delores, OA an ophthalmic technician. The creation of this record is the provider's dictation and/or activities during the visit.    Electronically signed by: Alan PARAS. Delores, OA 05/21/23  10:00 AM   Redell JUDITHANN Hans, M.D., Ph.D. Diseases & Surgery of the Retina and Vitreous [facility]  I have reviewed the above documentation for accuracy and completeness, and I agree with the above. Redell JUDITHANN Hans, M.D., Ph.D. 05/21/23 10:00 AM   Abbreviations: M myopia (nearsighted); A astigmatism; H hyperopia (farsighted); P presbyopia; Mrx spectacle prescription;  CTL contact lenses; OD right eye; OS left eye; OU both eyes  XT exotropia; ET esotropia; PEK punctate epithelial keratitis; PEE punctate epithelial erosions; DES dry eye syndrome; MGD meibomian gland dysfunction; ATs artificial tears; PFAT's preservative free artificial tears; NSC nuclear sclerotic cataract; PSC posterior subcapsular cataract; ERM epi-retinal membrane; PVD posterior vitreous detachment; RD retinal detachment; DM diabetes mellitus; DR diabetic retinopathy; NPDR non-proliferative diabetic retinopathy; PDR proliferative diabetic retinopathy; CSME clinically significant macular edema; DME diabetic macular edema; dbh dot blot hemorrhages; CWS cotton wool spot; POAG primary open angle glaucoma; C/D cup-to-disc ratio; HVF humphrey visual field; GVF goldmann visual field; OCT optical coherence tomography; IOP intraocular pressure; BRVO Branch retinal vein occlusion; CRVO central retinal vein occlusion; CRAO central retinal artery occlusion; BRAO branch retinal artery occlusion; RT retinal tear; SB scleral buckle; PPV pars plana vitrectomy; VH Vitreous hemorrhage; PRP panretinal laser photocoagulation; IVK intravitreal kenalog; VMT vitreomacular traction; MH Macular hole;  NVD neovascularization of the disc; NVE neovascularization elsewhere; AREDS age related eye disease study; ARMD age related macular degeneration; POAG primary open angle glaucoma; EBMD epithelial/anterior basement membrane dystrophy; ACIOL anterior chamber intraocular lens; IOL intraocular lens; PCIOL posterior chamber intraocular lens;  Phaco/IOL phacoemulsification with intraocular lens placement; PRK photorefractive keratectomy; LASIK laser assisted in situ keratomileusis; HTN hypertension; DM diabetes mellitus; COPD chronic obstructive pulmonary disease

## 2023-05-21 ENCOUNTER — Ambulatory Visit (INDEPENDENT_AMBULATORY_CARE_PROVIDER_SITE_OTHER): Payer: Medicare Other | Admitting: Ophthalmology

## 2023-05-21 ENCOUNTER — Encounter (INDEPENDENT_AMBULATORY_CARE_PROVIDER_SITE_OTHER): Payer: Self-pay | Admitting: Ophthalmology

## 2023-05-21 DIAGNOSIS — I10 Essential (primary) hypertension: Secondary | ICD-10-CM | POA: Diagnosis not present

## 2023-05-21 DIAGNOSIS — Z961 Presence of intraocular lens: Secondary | ICD-10-CM

## 2023-05-21 DIAGNOSIS — H353122 Nonexudative age-related macular degeneration, left eye, intermediate dry stage: Secondary | ICD-10-CM

## 2023-05-21 DIAGNOSIS — H353211 Exudative age-related macular degeneration, right eye, with active choroidal neovascularization: Secondary | ICD-10-CM

## 2023-05-21 DIAGNOSIS — H35033 Hypertensive retinopathy, bilateral: Secondary | ICD-10-CM

## 2023-05-21 DIAGNOSIS — H35372 Puckering of macula, left eye: Secondary | ICD-10-CM | POA: Diagnosis not present

## 2023-05-21 DIAGNOSIS — H25811 Combined forms of age-related cataract, right eye: Secondary | ICD-10-CM

## 2023-05-21 DIAGNOSIS — H25813 Combined forms of age-related cataract, bilateral: Secondary | ICD-10-CM

## 2023-05-21 MED ORDER — FARICIMAB-SVOA 6 MG/0.05ML IZ SOLN
6.0000 mg | INTRAVITREAL | Status: AC | PRN
  Administered 2023-05-21: 6 mg via INTRAVITREAL

## 2023-06-23 NOTE — Progress Notes (Signed)
 [facility] - Clinic Note  06/25/2023    CHIEF COMPLAINT Patient presents for Retina Follow Up  HISTORY OF PRESENT ILLNESS: Kimberly Noble is a 78 y.o. female who presents to the clinic today for:   HPI     Retina Follow Up   Patient presents with  Wet AMD.  In right eye.  This started 4 weeks ago.  I, the attending physician,  performed the HPI with the patient and updated documentation appropriately.        Comments   Patient here for 4 weeks retina follow up for exu ARMD OD. Patient states vision about the same. Had cataract sx OS. Scheduled for cataract surgery OD April 1 st. OS don't see clarity. Had the flu. Has a little bit of cough. No fever.      Last edited by Rennis Chris, MD on 06/25/2023 12:57 PM.    Pt states she has cataract sx OD scheduled for April 1st with Dr. Delaney Meigs, she states she is getting over the flu  Referring physician: No referring provider defined for this encounter.  HISTORICAL INFORMATION:   Selected notes from the MEDICAL RECORD NUMBER Referred by Dr. Zenaida Niece for PED OD LEE:  Ocular Hx- PMH-    CURRENT MEDICATIONS: No current outpatient medications on file. (Ophthalmic Drugs)   No current facility-administered medications for this visit. (Ophthalmic Drugs)   Current Outpatient Medications (Other)  Medication Sig   ALPRAZolam (XANAX) 1 MG tablet Take 1 mg by mouth 3 (three) times daily. 7am, 1pm, 7pm   Ascorbic Acid (VITAMIN C PO) Take 1 capsule by mouth daily. supplement from [facility]   Biotin w/ Vitamins C & E (HAIR/SKIN/NAILS PO) Take 1 capsule by mouth daily. supplement from [facility]   CALCIUM CARBONATE-VITAMIN D PO Take 1 capsule by mouth daily. supplement from [facility]   Cholecalciferol (VITAMIN D3 PO) Take 1 capsule by mouth daily. supplement from [facility]   Coenzyme Q10 (COQ10 PO) Take 1 capsule by mouth daily.   CRANBERRY PO Take 1 capsule by mouth daily. supplement from [facility]   Multiple Vitamin  (MULTIVITAMIN WITH MINERALS) TABS tablet Take 1 tablet by mouth daily. supplement from [facility]   Omega-3 Fatty Acids (FISH OIL PO) Take 1 capsule by mouth daily. supplement from [facility]   OVER THE COUNTER MEDICATION Take 1 capsule by mouth daily. Joint support supplement from [facility]   OVER THE COUNTER MEDICATION Take 1 capsule by mouth daily. Cholesterol care supplement from [facility]   OVER THE COUNTER MEDICATION Take 1 capsule by mouth daily. Circulation and vein support supplement from [facility]   OVER THE COUNTER MEDICATION Take 1 capsule by mouth daily. Green vegetable supplement from [employer]   OVER THE COUNTER MEDICATION Take 1 capsule by mouth daily. Liver and brain supplement from [facility]   OVER THE COUNTER MEDICATION Take 1 capsule by mouth daily. Eye vitamin with lutein - supplement from [facility]   VITAMIN K PO Take 1 capsule by mouth daily. supplement from [facility]   No current facility-administered medications for this visit. (Other)   REVIEW OF SYSTEMS: ROS   Positive for: Neurological, Eyes Negative for: Constitutional, Gastrointestinal, Skin, Genitourinary, Musculoskeletal, HENT, Endocrine, Cardiovascular, Respiratory, Psychiatric, Allergic/Imm, Heme/Lymph Last edited by Laddie Aquas, COA on 06/25/2023  9:25 AM.      ALLERGIES Allergies  Allergen Reactions   Crab Extract Anaphylaxis    Eating crab causes severe allergy.  Claims she is not allergic to all shellfish.    Penicillins Rash    Has patient had a PCN reaction causing immediate rash, facial/tongue/throat swelling, SOB or lightheadedness with hypotension: Yes Has patient had a PCN reaction causing severe rash involving mucus membranes or skin necrosis: No Has patient had a PCN reaction that required hospitalization No Has patient had a PCN reaction occurring within the last 10 years: No If all of the above answers are "NO", then may proceed with Cephalosporin use.    PAST MEDICAL HISTORY Past Medical  History:  Diagnosis Date   Agoraphobia with panic attacks    Anxiety attack    Major depression    Superficial laceration of hand ~ 03/2015   left thumb   Past Surgical History:  Procedure Laterality Date   TONSILLECTOMY  1950's   FAMILY HISTORY Family History  Problem Relation Age of Onset   Macular degeneration Mother    SOCIAL HISTORY Social History   Tobacco Use   Smoking status: Every Day    Current packs/day: 0.33    Average packs/day: 0.3 packs/day for 40.0 years (13.2 ttl pk-yrs)    Types: Cigarettes   Smokeless tobacco: Never  Vaping Use   Vaping status: Never Used  Substance Use Topics   Alcohol use: No   Drug use: No       OPHTHALMIC EXAM:  Base Eye Exam     Visual Acuity (Snellen - Linear)       Right Left   Dist [place] 20/300 20/30 -1   Dist ph [place] 20/60 -1 NI  Forgot to bring glasses.        Tonometry (Tonopen, 9:22 AM)       Right Left   Pressure 15 13         Pupils       Dark Light Shape React APD   Right 4 3 Round Brisk None   Left 4 3 Round Brisk None         Visual Fields (Counting fingers)       Left Right    Full Full         Extraocular Movement       Right Left    Full, Ortho Full, Ortho         Neuro/Psych     Oriented x3: Yes   Mood/Affect: Normal         Dilation     Both eyes: 1.0% Mydriacyl, 2.5% Phenylephrine @ 9:22 AM           Slit Lamp and Fundus Exam     Slit Lamp Exam       Right Left   Lids/Lashes Dermatochalasis - upper lid, mild MGD Dermatochalasis - upper lid, mild MGD   Conjunctiva/Sclera White and quiet White and quiet   Cornea mild arcus, trace PEE, trace tear film debris mild arcus, trace PEE, trace tear film debris   Anterior Chamber Deep and quiet deep and clear   Iris Round and dilated Round and dilated   Lens 3+ Nuclear sclerosis with brunescence, 3+ Cortical cataract toric PC IOL in good position with marks at 0115 and 0715   Anterior Vitreous Mild syneresis Mild  syneresis, Posterior vitreous detachment, vitreous condensations         Fundus Exam       Right Left   Disc Pink and Sharp, temporal PPA, Compact Pink and Sharp, temporal PPA, Compact, dot heme at 0100 -- resolved   C/D  Ratio 0.5 0.5   Macula Blunted foveal reflex, +CNV with pigmented RPE rip and surrounding atrophy, persistent shallow SRF -- slightly increased, no frank heme Flat, Blunted foveal reflex, fine drusen, RPE mottling and clumping, No heme or edema   Vessels attenuated, mild tortuosity mild attenuation, mild tortuosity   Periphery Attached, reticular degeneration, focal pigment clumping IT, No heme Attached, reticular degeneration, focal pigment clumping IT, No heme           Refraction     Wearing Rx       Sphere Cylinder Axis Add   Right -6.00 +1.75 117 +2.50   Left -5.25 +2.25 085 +2.50           IMAGING AND PROCEDURES  Imaging and Procedures for 06/25/2023  OCT, Retina - OU - Both Eyes       Right Eye Quality was good. Central Foveal Thickness: 396. Progression has worsened. Findings include no IRF, no SRF, abnormal foveal contour, subretinal hyper-reflective material, pigment epithelial detachment, vitreomacular adhesion (stable improvement in IRF, persistent central PED with trace pocket of SRF temporal to PED -- slightly increased, partial PVD).   Left Eye Quality was good. Central Foveal Thickness: 269. Progression has been stable. Findings include normal foveal contour, no IRF, no SRF, retinal drusen , epiretinal membrane, macular pucker (Very mild drusen, mild ERM with early pucker SN mac).   Notes *Images captured and stored on drive  Diagnosis / Impression:  OD: exu ARMD - stable improvement in IRF, persistent central PED with trace pocket of SRF temporal to PED -- slightly increased, partial PVD OS: non-exu ARMD - Very mild drusen, mild ERM with early pucker SN mac  Clinical management:  See below  Abbreviations: NFP - Normal foveal  profile. CME - cystoid macular edema. PED - pigment epithelial detachment. IRF - intraretinal fluid. SRF - subretinal fluid. EZ - ellipsoid zone. ERM - epiretinal membrane. ORA - outer retinal atrophy. ORT - outer retinal tubulation. SRHM - subretinal hyper-reflective material. IRHM - intraretinal hyper-reflective material      Intravitreal Injection, Pharmacologic Agent - OD - Right Eye       Time Out 06/25/2023. 10:16 AM. Confirmed correct patient, procedure, site, and patient consented.   Anesthesia Topical anesthesia was used. Anesthetic medications included Lidocaine 2%, Proparacaine 0.5%.   Procedure Preparation included 5% betadine to ocular surface, eyelid speculum. A (32g) needle was used.   Injection: 8 mg aflibercept 8 MG/0.07ML (Patient supplied)   Route: Intravitreal, Site: Right Eye   NDC: 78295-621-30, Lot: [id number], Expiration date: 07/13/2023, Waste: 0 mL   Post-op Post injection exam found visual acuity of at least counting fingers. The patient tolerated the procedure well. There were no complications. The patient received written and verbal post procedure care education. Post injection medications were not given.   Notes **SAMPLE MEDICATION ADMINISTERED**           ASSESSMENT/PLAN:    ICD-10-CM   1. Exudative age-related macular degeneration of right eye with active choroidal neovascularization (HCC)  H35.3211 OCT, Retina - OU - Both Eyes    Intravitreal Injection, Pharmacologic Agent - OD - Right Eye    aflibercept (EYLEA HD) ophthalmic injection 8 mg    2. 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 Epiretinal membrane (ERM) of left eye  H35.372     4. Essential hypertension  I10     5. Hypertensive retinopathy of both eyes  H35.033  6. Combined forms of age-related cataract of right eye  H25.811     7. Pseudophakia  Z96.1      1. 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, right eye - pt  presented initially on 3.16.23 with 2 wk history of central vision loss and distortion  - s/p IVA OD #1 (03.16.23), #2 (04.13.23), #3 (05.11.23), #4 (06.08.23) -- IVA resistance ========================================================== - s/p IVE OD #1 (07.06.23), #2 (08.03.23), #3 (08.31.23), #4 (10.05.23) -- IVE resistance ========================================================== - s/p IVV OD #1 (11.09.23), #2 (12.07.23), #3 (01.04.24), #4 (02.01.24), #5 (02.29.24), #6 (03.15.24), #7 (04.25.24), #8 (05.30.24), #9 (06.27.24), #10 (07.25.24), #11 (08.22.24), #12 (09.19.24), #13 (10.17.24), #14 (11.14.24), #15 (12.12.24), #16 (01.09.25), #17 (02.06.25 -- sample) **history of increased IRF at 5 weeks, noted on 03.13.25 and 05.30.24 exams** - BCVA OD stable at 20/50 - OCT shows stable improvement in IRF, persistent central PED with trace pocket of SRF temporal to PED -- slightly increased, partial PVD at 4 weeks **discussed decreased efficacy / resistance to Vabysmo and potential benefit of switching medication from Vabysmo to [place] HD** - recommend switching to IVE HD OD #1 [sample] today, 03.13.25 w/ f/u in 4 wks -- [employer] funding unavailable - pt wishes to proceed with injection - RBA of procedure discussed, questions answered - IVV informed consent obtained and signed, 02.06.25 (OD) - IVE HD informed consent obtained and signed, 03.13.25 (OD) - see procedure note  - f/u 4 weeks, DFE, OCT, possible injection  2. Age related macular degeneration, non-exudative, left eye  - mild/intermediate stage with mild drusen - The incidence, anatomy, and pathology of dry AMD, risk of progression, and the AREDS and AREDS 2 study including smoking risks discussed with patient.   - recommend Amsler grid monitoring  3. Epiretinal membrane, left eye  - mild ERM OS w/ early pucker, SN macula - BCVA 20/50 stable - asymptomatic, no metamorphopsia - no indication for surgery at this time - monitor for  now  4,5. Hypertensive retinopathy OU - discussed importance of tight BP control - monitor  6. Mixed Cataract OD - The symptoms of cataract, surgical options, and treatments and risks were discussed with patient.  - discussed diagnosis and progression - under the expert management of Dr. Delaney Meigs - CE with IOL OD scheduled for April 1st  7. Pseudophakia OS  - s/p CE/IOL OS (Dr. Delaney Meigs 01.14.25)  - toric IOL in good position  - monitor  Ophthalmic Meds Ordered this visit:  Meds ordered this encounter  Medications   aflibercept (EYLEA HD) ophthalmic injection 8 mg     Return in about 4 weeks (around 07/23/2023) for f/u exu ARMD OD, DFE, OCT, Possible Injxn.  There are no Patient Instructions on file for this visit.   Explained the diagnoses, plan, and follow up with the patient and they expressed understanding.  Patient expressed understanding of the importance of proper follow up care.   This document serves as a record of services personally performed by Karie Chimera, MD, PhD. It was created on their behalf by Annalee Genta, COMT. The creation of this record is the provider's dictation and/or activities during the visit.  Electronically signed by: Annalee Genta, COMT 06/25/23 1:03 PM  This document serves as a record of services personally performed by Karie Chimera, MD, PhD. It was created on their behalf by Glee Arvin. Manson Passey, OA an ophthalmic technician. The creation of this record is the provider's dictation and/or activities during the visit.    Electronically signed by:  Glee Arvin. Manson Passey, OA 06/25/23 1:03 PM  Karie Chimera, M.D., Ph.D. Diseases & Surgery of the Retina and Vitreous [facility]  I have reviewed the above documentation for accuracy and completeness, and I agree with the above. Karie Chimera, M.D., Ph.D. 06/25/23 1:03 PM   Abbreviations: M myopia (nearsighted); A astigmatism; H hyperopia (farsighted); P presbyopia; Mrx  spectacle prescription;  CTL contact lenses; OD right eye; OS left eye; OU both eyes  XT exotropia; ET esotropia; PEK punctate epithelial keratitis; PEE punctate epithelial erosions; DES dry eye syndrome; MGD meibomian gland dysfunction; ATs artificial tears; PFAT's preservative free artificial tears; NSC nuclear sclerotic cataract; PSC posterior subcapsular cataract; ERM epi-retinal membrane; PVD posterior vitreous detachment; RD retinal detachment; DM diabetes mellitus; DR diabetic retinopathy; NPDR non-proliferative diabetic retinopathy; PDR proliferative diabetic retinopathy; CSME clinically significant macular edema; DME diabetic macular edema; dbh dot blot hemorrhages; CWS cotton wool spot; POAG primary open angle glaucoma; C/D cup-to-disc ratio; HVF humphrey visual field; GVF goldmann visual field; OCT optical coherence tomography; IOP intraocular pressure; BRVO Branch retinal vein occlusion; CRVO central retinal vein occlusion; CRAO central retinal artery occlusion; BRAO branch retinal artery occlusion; RT retinal tear; SB scleral buckle; PPV pars plana vitrectomy; VH Vitreous hemorrhage; PRP panretinal laser photocoagulation; IVK intravitreal kenalog; VMT vitreomacular traction; MH Macular hole;  NVD neovascularization of the disc; NVE neovascularization elsewhere; AREDS age related eye disease study; ARMD age related macular degeneration; POAG primary open angle glaucoma; EBMD epithelial/anterior basement membrane dystrophy; ACIOL anterior chamber intraocular lens; IOL intraocular lens; PCIOL posterior chamber intraocular lens; Phaco/IOL phacoemulsification with intraocular lens placement; PRK photorefractive keratectomy; LASIK laser assisted in situ keratomileusis; HTN hypertension; DM diabetes mellitus; COPD chronic obstructive pulmonary disease

## 2023-06-25 ENCOUNTER — Encounter (INDEPENDENT_AMBULATORY_CARE_PROVIDER_SITE_OTHER): Payer: Self-pay | Admitting: Ophthalmology

## 2023-06-25 ENCOUNTER — Ambulatory Visit (INDEPENDENT_AMBULATORY_CARE_PROVIDER_SITE_OTHER): Payer: Medicare Other | Admitting: Ophthalmology

## 2023-06-25 DIAGNOSIS — H353211 Exudative age-related macular degeneration, right eye, with active choroidal neovascularization: Secondary | ICD-10-CM | POA: Diagnosis not present

## 2023-06-25 DIAGNOSIS — I10 Essential (primary) hypertension: Secondary | ICD-10-CM

## 2023-06-25 DIAGNOSIS — H353122 Nonexudative age-related macular degeneration, left eye, intermediate dry stage: Secondary | ICD-10-CM

## 2023-06-25 DIAGNOSIS — H35372 Puckering of macula, left eye: Secondary | ICD-10-CM | POA: Diagnosis not present

## 2023-06-25 DIAGNOSIS — Z961 Presence of intraocular lens: Secondary | ICD-10-CM

## 2023-06-25 DIAGNOSIS — H25811 Combined forms of age-related cataract, right eye: Secondary | ICD-10-CM

## 2023-06-25 DIAGNOSIS — H35033 Hypertensive retinopathy, bilateral: Secondary | ICD-10-CM

## 2023-06-25 MED ORDER — AFLIBERCEPT 8 MG/0.07ML IZ SOLN
8.0000 mg | INTRAVITREAL | Status: AC | PRN
  Administered 2023-06-25: 8 mg via INTRAVITREAL

## 2023-07-22 NOTE — Progress Notes (Signed)
 [facility] - Clinic Note  07/23/2023    CHIEF COMPLAINT Patient presents for Retina Follow Up  HISTORY OF PRESENT ILLNESS: Kimberly Noble is a 78 y.o. female who presents to the clinic today for:   HPI     Retina Follow Up   Patient presents with  Wet AMD.  In right eye.  This started 4 weeks ago.  I, the attending physician,  performed the HPI with the patient and updated documentation appropriately.        Comments   Patient here for 4 weeks retina follow up for exu ARMD OD. Patient states had cataract surgery OD April 1 st. Still using drops. Vision horrible OD . OS is good. No eye pain.      Last edited by Rennis Chris, MD on 07/23/2023 10:33 AM.    Pt had cataract sx OD on April 1st, she states she still can't see if with her left eye, she got +3.50 readers to help  Referring physician: No referring provider defined for this encounter.  HISTORICAL INFORMATION:   Selected notes from the MEDICAL RECORD NUMBER Referred by Dr. Zenaida Niece for PED OD LEE:  Ocular Hx- PMH-    CURRENT MEDICATIONS: No current outpatient medications on file. (Ophthalmic Drugs)   No current facility-administered medications for this visit. (Ophthalmic Drugs)   Current Outpatient Medications (Other)  Medication Sig   ALPRAZolam (XANAX) 1 MG tablet Take 1 mg by mouth 3 (three) times daily. 7am, 1pm, 7pm   Ascorbic Acid (VITAMIN C PO) Take 1 capsule by mouth daily. supplement from [facility]   Biotin w/ Vitamins C & E (HAIR/SKIN/NAILS PO) Take 1 capsule by mouth daily. supplement from [facility]   CALCIUM CARBONATE-VITAMIN D PO Take 1 capsule by mouth daily. supplement from [facility]   Cholecalciferol (VITAMIN D3 PO) Take 1 capsule by mouth daily. supplement from [facility]   Coenzyme Q10 (COQ10 PO) Take 1 capsule by mouth daily.   CRANBERRY PO Take 1 capsule by mouth daily. supplement from [facility]   Multiple Vitamin (MULTIVITAMIN WITH MINERALS) TABS tablet Take 1 tablet by  mouth daily. supplement from [facility]   Omega-3 Fatty Acids (FISH OIL PO) Take 1 capsule by mouth daily. supplement from [facility]   OVER THE COUNTER MEDICATION Take 1 capsule by mouth daily. Joint support supplement from [facility]   OVER THE COUNTER MEDICATION Take 1 capsule by mouth daily. Cholesterol care supplement from [facility]   OVER THE COUNTER MEDICATION Take 1 capsule by mouth daily. Circulation and vein support supplement from [facility]   OVER THE COUNTER MEDICATION Take 1 capsule by mouth daily. Green vegetable supplement from [employer]   OVER THE COUNTER MEDICATION Take 1 capsule by mouth daily. Liver and brain supplement from [facility]   OVER THE COUNTER MEDICATION Take 1 capsule by mouth daily. Eye vitamin with lutein - supplement from [facility]   VITAMIN K PO Take 1 capsule by mouth daily. supplement from [facility]   No current facility-administered medications for this visit. (Other)   REVIEW OF SYSTEMS: ROS   Positive for: Neurological, Eyes Negative for: Constitutional, Gastrointestinal, Skin, Genitourinary, Musculoskeletal, HENT, Endocrine, Cardiovascular, Respiratory, Psychiatric, Allergic/Imm, Heme/Lymph Last edited by Laddie Aquas, COA on 07/23/2023  9:10 AM.       ALLERGIES Allergies  Allergen Reactions   Crab Extract Anaphylaxis    Eating crab causes severe allergy. Claims she is not allergic to all  shellfish.    Penicillins Rash    Has patient had a PCN reaction causing immediate rash, facial/tongue/throat swelling, SOB or lightheadedness with hypotension: Yes Has patient had a PCN reaction causing severe rash involving mucus membranes or skin necrosis: No Has patient had a PCN reaction that required hospitalization No Has patient had a PCN reaction occurring within the last 10 years: No If all of the above answers are "NO", then may proceed with Cephalosporin use.    PAST MEDICAL HISTORY Past Medical History:  Diagnosis Date   Agoraphobia with panic attacks     Anxiety attack    Major depression    Superficial laceration of hand ~ 03/2015   left thumb   Past Surgical History:  Procedure Laterality Date   TONSILLECTOMY  1950's   FAMILY HISTORY Family History  Problem Relation Age of Onset   Macular degeneration Mother    SOCIAL HISTORY Social History   Tobacco Use   Smoking status: Every Day    Current packs/day: 0.33    Average packs/day: 0.3 packs/day for 40.0 years (13.2 ttl pk-yrs)    Types: Cigarettes   Smokeless tobacco: Never  Vaping Use   Vaping status: Never Used  Substance Use Topics   Alcohol use: No   Drug use: No       OPHTHALMIC EXAM:  Base Eye Exam     Visual Acuity (Snellen - Linear)       Right Left   Dist [employer] [place] 20/40 20/30 +1   Dist ph [place] 20/30 20/25 -1         Tonometry (Tonopen, 9:06 AM)       Right Left   Pressure 21 13         Pupils       Dark Light Shape React APD   Right 4 3 Round Brisk None   Left 4 3 Round Brisk None         Visual Fields (Counting fingers)       Left Right    Full Full         Extraocular Movement       Right Left    Full, Ortho Full, Ortho         Neuro/Psych     Oriented x3: Yes   Mood/Affect: Normal         Dilation     Both eyes: 1.0% Mydriacyl, 2.5% Phenylephrine @ 9:06 AM           Slit Lamp and Fundus Exam     Slit Lamp Exam       Right Left   Lids/Lashes Dermatochalasis - upper lid, mild MGD Dermatochalasis - upper lid, mild MGD   Conjunctiva/Sclera White and quiet White and quiet   Cornea mild arcus, trace PEE, trace tear film debris, well healed cataract wound with mild edema mild arcus, trace PEE, trace tear film debris   Anterior Chamber Deep, 0.5+ cell / pigment deep and clear   Iris Round and dilated Round and dilated   Lens Toric PC IOL in good position with marks at 0545 and 1145 toric PC IOL in good position with marks at 0115 and 0715   Anterior Vitreous Mild syneresis Mild syneresis, Posterior vitreous  detachment, vitreous condensations         Fundus Exam       Right Left   Disc Pink and Sharp, temporal PPA, Compact Pink and Sharp, temporal PPA, Compact, dot heme at 0100 -- resolved  C/D Ratio 0.5 0.5   Macula Blunted foveal reflex, +CNV with pigmented RPE rip and surrounding atrophy, persistent shallow SRF -- slightly improved, no frank heme Flat, Blunted foveal reflex, fine drusen, RPE mottling and clumping, No heme or edema   Vessels attenuated, mild tortuosity mild attenuation, mild tortuosity   Periphery Attached, reticular degeneration, focal pigment clumping IT, No heme Attached, reticular degeneration, focal pigment clumping IT, No heme           Refraction     Wearing Rx       Sphere Cylinder Axis Add   Right -6.00 +1.75 117 +2.50   Left -5.25 +2.25 085 +2.50           IMAGING AND PROCEDURES  Imaging and Procedures for 07/23/2023  OCT, Retina - OU - Both Eyes       Right Eye Quality was good. Central Foveal Thickness: 392. Progression has improved. Findings include no IRF, no SRF, abnormal foveal contour, subretinal hyper-reflective material, pigment epithelial detachment, vitreomacular adhesion (stable improvement in IRF, persistent central PED with trace pocket of SRF temporal to PED -- slightly improved, partial PVD).   Left Eye Quality was good. Central Foveal Thickness: 270. Progression has been stable. Findings include normal foveal contour, no IRF, no SRF, retinal drusen , epiretinal membrane, macular pucker (Very mild drusen, mild ERM with early pucker SN mac).   Notes *Images captured and stored on drive  Diagnosis / Impression:  OD: exu ARMD - stable improvement in IRF, persistent central PED with trace pocket of SRF temporal to PED -- slightly improved, partial PVD OS: non-exu ARMD - Very mild drusen, mild ERM with early pucker SN mac  Clinical management:  See below  Abbreviations: NFP - Normal foveal profile. CME - cystoid macular edema.  PED - pigment epithelial detachment. IRF - intraretinal fluid. SRF - subretinal fluid. EZ - ellipsoid zone. ERM - epiretinal membrane. ORA - outer retinal atrophy. ORT - outer retinal tubulation. SRHM - subretinal hyper-reflective material. IRHM - intraretinal hyper-reflective material      Intravitreal Injection, Pharmacologic Agent - OD - Right Eye       Time Out 07/23/2023. 9:42 AM. Confirmed correct patient, procedure, site, and patient consented.   Anesthesia Topical anesthesia was used. Anesthetic medications included Lidocaine 2%, Proparacaine 0.5%.   Procedure Preparation included 5% betadine to ocular surface, eyelid speculum. A (32g) needle was used.   Injection: 8 mg aflibercept 8 MG/0.07ML (Patient supplied)   Route: Intravitreal, Site: Right Eye   NDC: 40981-191-47, Lot: [id number], Expiration date: 02/12/2024, Waste: 0 mL   Post-op Post injection exam found visual acuity of at least counting fingers. The patient tolerated the procedure well. There were no complications. The patient received written and verbal post procedure care education. Post injection medications were not given.   Notes **SAMPLE MEDICATION ADMINISTERED**           ASSESSMENT/PLAN:    ICD-10-CM   1. Exudative age-related macular degeneration of right eye with active choroidal neovascularization (HCC)  H35.3211 OCT, Retina - OU - Both Eyes    Intravitreal Injection, Pharmacologic Agent - OD - Right Eye    aflibercept (EYLEA HD) ophthalmic injection 8 mg    2. 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 Epiretinal membrane (ERM) of left eye  H35.372     4. Essential hypertension  I10     5. Hypertensive retinopathy of both eyes  H35.033  6. Pseudophakia of both eyes  Z96.1      1. 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, right eye - pt presented initially on 3.16.23 with 2 wk history of central vision loss and distortion  - s/p  IVA OD #1 (03.16.23), #2 (04.13.23), #3 (05.11.23), #4 (06.08.23) -- IVA resistance ========================================================== - s/p IVE OD #1 (07.06.23), #2 (08.03.23), #3 (08.31.23), #4 (10.05.23) -- IVE resistance ========================================================== - s/p IVV OD #1 (11.09.23), #2 (12.07.23), #3 (01.04.24), #4 (02.01.24), #5 (02.29.24), #6 (03.15.24), #7 (04.25.24), #8 (05.30.24), #9 (06.27.24), #10 (07.25.24), #11 (08.22.24), #12 (09.19.24), #13 (10.17.24), #14 (11.14.24), #15 (12.12.24), #16 (01.09.25), #17 (02.06.25 -- sample) -- IVV resistance =========================================================== - s/p IVE HD OD #1 (03.13.25) **history of increased IRF at 5 weeks, noted on 03.13.25 and 05.30.24 exams** - BCVA OD improved to 20/30 from 20/50 -- s/p CE IOL - OCT shows stable improvement in IRF, persistent central PED with trace pocket of SRF temporal to PED -- slightly improved, partial PVD at 4 weeks - recommend IVE HD OD #2 [sample] today, 04.10.25 w/ f/u in 4 wks -- [employer] funding unavailable - pt wishes to proceed with injection - RBA of procedure discussed, questions answered - IVV informed consent obtained and signed, 02.06.25 (OD) - IVE HD informed consent obtained and signed, 03.13.25 (OD) - see procedure note  - f/u 4 weeks, DFE, OCT, possible injection  2. Age related macular degeneration, non-exudative, left eye  - mild/intermediate stage with mild drusen - The incidence, anatomy, and pathology of dry AMD, risk of progression, and the AREDS and AREDS 2 study including smoking risks discussed with patient.   - recommend Amsler grid monitoring  3. Epiretinal membrane, left eye  - mild ERM OS w/ early pucker, SN macula - BCVA 20/30 stable - asymptomatic, no metamorphopsia - no indication for surgery at this time - monitor for now  4,5. Hypertensive retinopathy OU - discussed importance of tight BP control - monitor  6.  Pseudophakia OU  - s/p CE/IOL OU (Dr. Delaney Meigs - OS 01.14.25, OD 04.01.25)  - toric IOLs in good position  - monitor  Ophthalmic Meds Ordered this visit:  Meds ordered this encounter  Medications   aflibercept (EYLEA HD) ophthalmic injection 8 mg     Return in about 4 weeks (around 08/20/2023) for f/u exu ARMD OD, DFE, OCT, Possible Injxn.  There are no Patient Instructions on file for this visit.   Explained the diagnoses, plan, and follow up with the patient and they expressed understanding.  Patient expressed understanding of the importance of proper follow up care.   This document serves as a record of services personally performed by Karie Chimera, MD, PhD. It was created on their behalf by Annalee Genta, COMT. The creation of this record is the provider's dictation and/or activities during the visit.  Electronically signed by: Annalee Genta, COMT 07/23/23 10:36 AM  This document serves as a record of services personally performed by Karie Chimera, MD, PhD. It was created on their behalf by Glee Arvin. Manson Passey, OA an ophthalmic technician. The creation of this record is the provider's dictation and/or activities during the visit.    Electronically signed by: Glee Arvin. Manson Passey, OA 07/23/23 10:36 AM  Karie Chimera, M.D., Ph.D. Diseases & Surgery of the Retina and Vitreous [facility]  I have reviewed the above documentation for accuracy and completeness, and I agree with the above. Karie Chimera, M.D., Ph.D. 07/23/23 10:38 AM  Abbreviations: M myopia (nearsighted); A astigmatism; H hyperopia (farsighted); P presbyopia; Mrx spectacle prescription;  CTL contact lenses; OD right eye; OS left eye; OU both eyes  XT exotropia; ET esotropia; PEK punctate epithelial keratitis; PEE punctate epithelial erosions; DES dry eye syndrome; MGD meibomian gland dysfunction; ATs artificial tears; PFAT's preservative free artificial tears; NSC nuclear sclerotic cataract; PSC  posterior subcapsular cataract; ERM epi-retinal membrane; PVD posterior vitreous detachment; RD retinal detachment; DM diabetes mellitus; DR diabetic retinopathy; NPDR non-proliferative diabetic retinopathy; PDR proliferative diabetic retinopathy; CSME clinically significant macular edema; DME diabetic macular edema; dbh dot blot hemorrhages; CWS cotton wool spot; POAG primary open angle glaucoma; C/D cup-to-disc ratio; HVF humphrey visual field; GVF goldmann visual field; OCT optical coherence tomography; IOP intraocular pressure; BRVO Branch retinal vein occlusion; CRVO central retinal vein occlusion; CRAO central retinal artery occlusion; BRAO branch retinal artery occlusion; RT retinal tear; SB scleral buckle; PPV pars plana vitrectomy; VH Vitreous hemorrhage; PRP panretinal laser photocoagulation; IVK intravitreal kenalog; VMT vitreomacular traction; MH Macular hole;  NVD neovascularization of the disc; NVE neovascularization elsewhere; AREDS age related eye disease study; ARMD age related macular degeneration; POAG primary open angle glaucoma; EBMD epithelial/anterior basement membrane dystrophy; ACIOL anterior chamber intraocular lens; IOL intraocular lens; PCIOL posterior chamber intraocular lens; Phaco/IOL phacoemulsification with intraocular lens placement; PRK photorefractive keratectomy; LASIK laser assisted in situ keratomileusis; HTN hypertension; DM diabetes mellitus; COPD chronic obstructive pulmonary disease

## 2023-07-23 ENCOUNTER — Ambulatory Visit (INDEPENDENT_AMBULATORY_CARE_PROVIDER_SITE_OTHER): Admitting: Ophthalmology

## 2023-07-23 ENCOUNTER — Encounter (INDEPENDENT_AMBULATORY_CARE_PROVIDER_SITE_OTHER): Payer: Self-pay | Admitting: Ophthalmology

## 2023-07-23 DIAGNOSIS — I10 Essential (primary) hypertension: Secondary | ICD-10-CM

## 2023-07-23 DIAGNOSIS — H353122 Nonexudative age-related macular degeneration, left eye, intermediate dry stage: Secondary | ICD-10-CM

## 2023-07-23 DIAGNOSIS — H353211 Exudative age-related macular degeneration, right eye, with active choroidal neovascularization: Secondary | ICD-10-CM | POA: Diagnosis not present

## 2023-07-23 DIAGNOSIS — H35372 Puckering of macula, left eye: Secondary | ICD-10-CM

## 2023-07-23 DIAGNOSIS — H25811 Combined forms of age-related cataract, right eye: Secondary | ICD-10-CM

## 2023-07-23 DIAGNOSIS — Z961 Presence of intraocular lens: Secondary | ICD-10-CM

## 2023-07-23 DIAGNOSIS — H35033 Hypertensive retinopathy, bilateral: Secondary | ICD-10-CM

## 2023-07-23 MED ORDER — AFLIBERCEPT 8 MG/0.07ML IZ SOLN
8.0000 mg | INTRAVITREAL | Status: AC | PRN
  Administered 2023-07-23: 8 mg via INTRAVITREAL

## 2023-08-19 NOTE — Progress Notes (Signed)
 [facility] - Clinic Note  08/20/2023    CHIEF COMPLAINT Patient presents for Retina Follow Up  HISTORY OF PRESENT ILLNESS: Kimberly Noble is a 78 y.o. female who presents to the clinic today for:   HPI     Retina Follow Up   Patient presents with  Wet AMD.  In right eye.  This started 4 weeks ago.  I, the attending physician,  performed the HPI with the patient and updated documentation appropriately.        Comments   Patient here for 4 weeks retina follow up for exu ARMD OD. Patient states vision not good. Had cataract surgery OD April 1 st.no eye pain. Finished with post op drops. Still using other drops.      Last edited by Ronelle Coffee, MD on 08/20/2023 11:01 AM.    Pt went to see Dr Geralyn Knee and he told her that she had a film on the back of her lens that Dr. Kirkland Peppers could laser  Referring physician: No referring provider defined for this encounter.  HISTORICAL INFORMATION:   Selected notes from the MEDICAL RECORD NUMBER Referred by Dr. Carloyn Chi for PED OD LEE:  Ocular Hx- PMH-    CURRENT MEDICATIONS: No current outpatient medications on file. (Ophthalmic Drugs)   No current facility-administered medications for this visit. (Ophthalmic Drugs)   Current Outpatient Medications (Other)  Medication Sig   ALPRAZolam  (XANAX ) 1 MG tablet Take 1 mg by mouth 3 (three) times daily. 7am, 1pm, 7pm   Ascorbic Acid (VITAMIN C PO) Take 1 capsule by mouth daily. supplement from [facility]   Biotin w/ Vitamins C & E (HAIR/SKIN/NAILS PO) Take 1 capsule by mouth daily. supplement from [facility]   CALCIUM CARBONATE-VITAMIN D PO Take 1 capsule by mouth daily. supplement from [facility]   Cholecalciferol (VITAMIN D3 PO) Take 1 capsule by mouth daily. supplement from [facility]   Coenzyme Q10 (COQ10 PO) Take 1 capsule by mouth daily.   CRANBERRY PO Take 1 capsule by mouth daily. supplement from [facility]   Multiple Vitamin (MULTIVITAMIN WITH MINERALS) TABS tablet Take 1  tablet by mouth daily. supplement from [facility]   Omega-3 Fatty Acids (FISH OIL PO) Take 1 capsule by mouth daily. supplement from [facility]   OVER THE COUNTER MEDICATION Take 1 capsule by mouth daily. Joint support supplement from [facility]   OVER THE COUNTER MEDICATION Take 1 capsule by mouth daily. Cholesterol care supplement from [facility]   OVER THE COUNTER MEDICATION Take 1 capsule by mouth daily. Circulation and vein support supplement from [facility]   OVER THE COUNTER MEDICATION Take 1 capsule by mouth daily. Green vegetable supplement from [employer]   OVER THE COUNTER MEDICATION Take 1 capsule by mouth daily. Liver and brain supplement from [facility]   OVER THE COUNTER MEDICATION Take 1 capsule by mouth daily. Eye vitamin with lutein - supplement from [facility]   VITAMIN K PO Take 1 capsule by mouth daily. supplement from [facility]   No current facility-administered medications for this visit. (Other)   REVIEW OF SYSTEMS: ROS   Positive for: Neurological, Eyes Negative for: Constitutional, Gastrointestinal, Skin, Genitourinary, Musculoskeletal, HENT, Endocrine, Cardiovascular, Respiratory, Psychiatric, Allergic/Imm, Heme/Lymph Last edited by Sylvan Evener, COA on 08/20/2023  8:54 AM.        ALLERGIES Allergies  Allergen Reactions   Crab Extract Anaphylaxis    Eating crab causes severe allergy. Claims she is not  allergic to all shellfish.    Penicillins Rash    Has patient had a PCN reaction causing immediate rash, facial/tongue/throat swelling, SOB or lightheadedness with hypotension: Yes Has patient had a PCN reaction causing severe rash involving mucus membranes or skin necrosis: No Has patient had a PCN reaction that required hospitalization No Has patient had a PCN reaction occurring within the last 10 years: No If all of the above answers are "NO", then may proceed with Cephalosporin use.    PAST MEDICAL HISTORY Past Medical History:  Diagnosis Date   Agoraphobia with  panic attacks    Anxiety attack    Major depression    Superficial laceration of hand ~ 03/2015   left thumb   Past Surgical History:  Procedure Laterality Date   TONSILLECTOMY  1950's   FAMILY HISTORY Family History  Problem Relation Age of Onset   Macular degeneration Mother    SOCIAL HISTORY Social History   Tobacco Use   Smoking status: Every Day    Current packs/day: 0.33    Average packs/day: 0.3 packs/day for 40.0 years (13.2 ttl pk-yrs)    Types: Cigarettes   Smokeless tobacco: Never  Vaping Use   Vaping status: Never Used  Substance Use Topics   Alcohol use: No   Drug use: No       OPHTHALMIC EXAM:  Base Eye Exam     Visual Acuity (Snellen - Linear)       Right Left   Dist [place] 20/40 -1 20/30 -2   Dist ph [place] 20/30 -1 20/25         Tonometry (Tonopen, 8:51 AM)       Right Left   Pressure 19 17         Pupils       Dark Light Shape React APD   Right 4 3 Round Brisk None   Left 4 3 Round Brisk None         Visual Fields (Counting fingers)       Left Right    Full Full         Extraocular Movement       Right Left    Full, Ortho Full, Ortho         Neuro/Psych     Oriented x3: Yes   Mood/Affect: Normal         Dilation     Both eyes: 1.0% Mydriacyl, 2.5% Phenylephrine @ 8:51 AM           Slit Lamp and Fundus Exam     Slit Lamp Exam       Right Left   Lids/Lashes Dermatochalasis - upper lid, mild MGD Dermatochalasis - upper lid, mild MGD   Conjunctiva/Sclera White and quiet White and quiet   Cornea mild arcus, trace PEE, trace tear film debris, well healed cataract wound with mild edema mild arcus, trace PEE, trace tear film debris   Anterior Chamber Deep, 0.5+ cell / pigment deep and clear   Iris Round and dilated Round and dilated   Lens Toric PC IOL in good position with marks at 0545 and 1145 toric PC IOL in good position with marks at 0115 and 0715   Anterior Vitreous Mild syneresis Mild syneresis,  Posterior vitreous detachment, vitreous condensations         Fundus Exam       Right Left   Disc Pink and Sharp, temporal PPA, Compact Pink and Sharp, temporal PPA, Compact   C/D  Ratio 0.5 0.5   Macula Blunted foveal reflex, +CNV with pigmented RPE rip and surrounding atrophy, persistent shallow SRF -- slightly improved, no frank heme Flat, Blunted foveal reflex, fine drusen, RPE mottling and clumping, No heme or edema   Vessels attenuated, Tortuous mild attenuation, mild tortuosity   Periphery Attached, reticular degeneration, focal pigment clumping IT, No heme Attached, reticular degeneration, focal pigment clumping IT, No heme           IMAGING AND PROCEDURES  Imaging and Procedures for 08/20/2023  OCT, Retina - OU - Both Eyes        Right Eye Quality was good. Central Foveal Thickness: 402. Progression has improved. Findings include no IRF, no SRF, abnormal foveal contour, subretinal hyper-reflective material, pigment epithelial detachment, vitreomacular adhesion (stable improvement in IRF, persistent central PED with trace pocket of SRF temporal to PED -- slightly improved, partial PVD).   Left Eye Quality was good. Central Foveal Thickness: 269. Progression has been stable. Findings include normal foveal contour, no IRF, no SRF, retinal drusen , epiretinal membrane, macular pucker (Very mild drusen, mild ERM with early pucker SN mac).   Notes  *Images captured and stored on drive  Diagnosis / Impression:  OD: exu ARMD - stable improvement in IRF, persistent central PED with trace pocket of SRF temporal to PED -- slightly improved, partial PVD OS: non-exu ARMD - Very mild drusen, mild ERM with early pucker SN mac  Clinical management:  See below  Abbreviations: NFP - Normal foveal profile. CME - cystoid macular edema. PED - pigment epithelial detachment. IRF - intraretinal fluid. SRF - subretinal fluid. EZ - ellipsoid zone. ERM - epiretinal membrane. ORA - outer retinal  atrophy. ORT - outer retinal tubulation. SRHM - subretinal hyper-reflective material. IRHM - intraretinal hyper-reflective material      Intravitreal Injection, Pharmacologic Agent - OD - Right Eye        Time Out 08/20/2023. 9:29 AM. Confirmed correct patient, procedure, site, and patient consented.   Anesthesia Topical anesthesia was used. Anesthetic medications included Lidocaine 2%, Proparacaine 0.5%.   Procedure Preparation included 5% betadine to ocular surface, eyelid speculum. A (32g) needle was used.   Injection: 8 mg aflibercept  8 MG/0.07ML (Patient supplied)   Route: Intravitreal, Site: Right Eye   NDC: [id number], Lot: [id number], Expiration date: 02/12/2024, Waste: 0 mL   Post-op Post injection exam found visual acuity of at least counting fingers. The patient tolerated the procedure well. There were no complications. The patient received written and verbal post procedure care education. Post injection medications were not given.   Notes  **SAMPLE MEDICATION ADMINISTERED**           ASSESSMENT/PLAN:    ICD-10-CM   1. Exudative age-related macular degeneration of right eye with active choroidal neovascularization (HCC)  H35.3211 OCT, Retina - OU - Both Eyes    Intravitreal Injection, Pharmacologic Agent - OD - Right Eye    aflibercept  (EYLEA  HD) ophthalmic injection 8 mg    2. 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 Epiretinal membrane (ERM) of left eye  H35.372     4. Essential hypertension  I10     5. Hypertensive retinopathy of both eyes  H35.033     6. Pseudophakia of both eyes  Z96.1      1. 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, right eye - pt presented initially on 3.16.23 with 2 wk history of central vision loss and distortion  -  s/p IVA OD #1 (03.16.23), #2 (04.13.23), #3 (05.11.23), #4 (06.08.23) -- IVA resistance ========================================================== - s/p  IVE OD #1 (07.06.23), #2 (08.03.23), #3 (08.31.23), #4 (10.05.23) -- IVE resistance ========================================================== - s/p IVV OD #1 (11.09.23), #2 (12.07.23), #3 (01.04.24), #4 (02.01.24), #5 (02.29.24), #6 (03.15.24), #7 (04.25.24), #8 (05.30.24), #9 (06.27.24), #10 (07.25.24), #11 (08.22.24), #12 (09.19.24), #13 (10.17.24), #14 (11.14.24), #15 (12.12.24), #16 (01.09.25), #17 (02.06.25 -- sample) -- IVV resistance =========================================================== - s/p IVE HD OD #1 (03.13.25), #2 (04.10.25) **history of increased IRF at 5 weeks, noted on 03.13.25 and 05.30.24 exams** - BCVA OD stable at 20/30 -- s/p CE IOL - OCT shows stable improvement in IRF, persistent central PED with trace pocket of SRF temporal to PED -- slightly improved, partial PVD at 4 weeks - recommend IVE HD OD #3 [sample] today, 05.08.25 w/ f/u in 4 wks -- [employer] funding unavailable - pt wishes to proceed with injection - RBA of procedure discussed, questions answered - IVV informed consent obtained and signed, 02.06.25 (OD) - IVE HD informed consent obtained and signed, 03.13.25 (OD) - see procedure note  - f/u 4 weeks, DFE, OCT, possible injection  2. Age related macular degeneration, non-exudative, left eye  - mild/intermediate stage with mild drusen - The incidence, anatomy, and pathology of dry AMD, risk of progression, and the AREDS and AREDS 2 study including smoking risks discussed with patient.   - recommend Amsler grid monitoring  3. Epiretinal membrane, left eye  - mild ERM OS w/ early pucker, SN macula - BCVA 20/25 stable - asymptomatic, no metamorphopsia - no indication for surgery at this time - monitor for now  4,5. Hypertensive retinopathy OU - discussed importance of tight BP control - monitor  6. Pseudophakia OU  - s/p CE/IOL OU (Dr. Kirkland Peppers - OS 01.14.25, OD 04.01.25)  - toric IOLs in good position  - monitor  Ophthalmic Meds Ordered  this visit:  Meds ordered this encounter  Medications   aflibercept  (EYLEA  HD) ophthalmic injection 8 mg     Return in about 4 weeks (around 09/17/2023) for f/u exu ARMD OD, DFE, OCT, Possible Injxn.  There are no Patient Instructions on file for this visit.   Explained the diagnoses, plan, and follow up with the patient and they expressed understanding.  Patient expressed understanding of the importance of proper follow up care.   This document serves as a record of services personally performed by Jeanice Millard, MD, PhD. It was created on their behalf by Diona Franklin, COMT. The creation of this record is the provider's dictation and/or activities during the visit.  Electronically signed by: Diona Franklin, COMT 08/22/23 2:10 AM  This document serves as a record of services personally performed by Jeanice Millard, MD, PhD. It was created on their behalf by Morley Arabia. Bevin Bucks, OA an ophthalmic technician. The creation of this record is the provider's dictation and/or activities during the visit.    Electronically signed by: Morley Arabia. Bevin Bucks, OA 08/22/23 2:10 AM  Jeanice Millard, M.D., Ph.D. Diseases & Surgery of the Retina and Vitreous [facility]  I have reviewed the above documentation for accuracy and completeness, and I agree with the above. Jeanice Millard, M.D., Ph.D. 08/22/23 2:11 AM   Abbreviations: M myopia (nearsighted); A astigmatism; H hyperopia (farsighted); P presbyopia; Mrx spectacle prescription;  CTL contact lenses; OD right eye; OS left eye; OU both eyes  XT exotropia; ET esotropia; PEK punctate epithelial keratitis; PEE punctate  epithelial erosions; DES dry eye syndrome; MGD meibomian gland dysfunction; ATs artificial tears; PFAT's preservative free artificial tears; NSC nuclear sclerotic cataract; PSC posterior subcapsular cataract; ERM epi-retinal membrane; PVD posterior vitreous detachment; RD retinal detachment; DM diabetes mellitus; DR diabetic  retinopathy; NPDR non-proliferative diabetic retinopathy; PDR proliferative diabetic retinopathy; CSME clinically significant macular edema; DME diabetic macular edema; dbh dot blot hemorrhages; CWS cotton wool spot; POAG primary open angle glaucoma; C/D cup-to-disc ratio; HVF humphrey visual field; GVF goldmann visual field; OCT optical coherence tomography; IOP intraocular pressure; BRVO Branch retinal vein occlusion; CRVO central retinal vein occlusion; CRAO central retinal artery occlusion; BRAO branch retinal artery occlusion; RT retinal tear; SB scleral buckle; PPV pars plana vitrectomy; VH Vitreous hemorrhage; PRP panretinal laser photocoagulation; IVK intravitreal kenalog; VMT vitreomacular traction; MH Macular hole;  NVD neovascularization of the disc; NVE neovascularization elsewhere; AREDS age related eye disease study; ARMD age related macular degeneration; POAG primary open angle glaucoma; EBMD epithelial/anterior basement membrane dystrophy; ACIOL anterior chamber intraocular lens; IOL intraocular lens; PCIOL posterior chamber intraocular lens; Phaco/IOL phacoemulsification with intraocular lens placement; PRK photorefractive keratectomy; LASIK laser assisted in situ keratomileusis; HTN hypertension; DM diabetes mellitus; COPD chronic obstructive pulmonary disease

## 2023-08-20 ENCOUNTER — Ambulatory Visit (INDEPENDENT_AMBULATORY_CARE_PROVIDER_SITE_OTHER): Admitting: Ophthalmology

## 2023-08-20 ENCOUNTER — Encounter (INDEPENDENT_AMBULATORY_CARE_PROVIDER_SITE_OTHER): Payer: Self-pay | Admitting: Ophthalmology

## 2023-08-20 DIAGNOSIS — H353211 Exudative age-related macular degeneration, right eye, with active choroidal neovascularization: Secondary | ICD-10-CM

## 2023-08-20 DIAGNOSIS — H35372 Puckering of macula, left eye: Secondary | ICD-10-CM | POA: Diagnosis not present

## 2023-08-20 DIAGNOSIS — H353122 Nonexudative age-related macular degeneration, left eye, intermediate dry stage: Secondary | ICD-10-CM

## 2023-08-20 DIAGNOSIS — I10 Essential (primary) hypertension: Secondary | ICD-10-CM | POA: Diagnosis not present

## 2023-08-20 DIAGNOSIS — H35033 Hypertensive retinopathy, bilateral: Secondary | ICD-10-CM

## 2023-08-20 DIAGNOSIS — Z961 Presence of intraocular lens: Secondary | ICD-10-CM

## 2023-08-20 MED ORDER — AFLIBERCEPT 8 MG/0.07ML IZ SOLN
8.0000 mg | INTRAVITREAL | Status: AC | PRN
  Administered 2023-08-20: 8 mg via INTRAVITREAL

## 2023-09-10 NOTE — Progress Notes (Signed)
 [facility] - Clinic Note  09/17/2023    CHIEF COMPLAINT Patient presents for Retina Follow Up  HISTORY OF PRESENT ILLNESS: Kimberly Noble is a 78 y.o. female who presents to the clinic today for:   HPI     Retina Follow Up   Patient presents with  Wet AMD.  In right eye.  This started 4 weeks ago.  I, the attending physician,  performed the HPI with the patient and updated documentation appropriately.        Comments   Patient here for 4 weeks retina follow up for exu ARMD OD. Patient states vision is ok. OS need to be tweaked. No eye pain. Using drops.      Last edited by Ronelle Coffee, MD on 09/17/2023 12:13 PM.     Referring physician: No referring provider defined for this encounter.  HISTORICAL INFORMATION:   Selected notes from the MEDICAL RECORD NUMBER Referred by Dr. Carloyn Chi for PED OD LEE:  Ocular Hx- PMH-    CURRENT MEDICATIONS: No current outpatient medications on file. (Ophthalmic Drugs)   No current facility-administered medications for this visit. (Ophthalmic Drugs)   Current Outpatient Medications (Other)  Medication Sig   ALPRAZolam  (XANAX ) 1 MG tablet Take 1 mg by mouth 3 (three) times daily. 7am, 1pm, 7pm   Ascorbic Acid (VITAMIN C PO) Take 1 capsule by mouth daily. supplement from [facility]   Biotin w/ Vitamins C & E (HAIR/SKIN/NAILS PO) Take 1 capsule by mouth daily. supplement from [facility]   CALCIUM CARBONATE-VITAMIN D PO Take 1 capsule by mouth daily. supplement from [facility]   Cholecalciferol (VITAMIN D3 PO) Take 1 capsule by mouth daily. supplement from [facility]   Coenzyme Q10 (COQ10 PO) Take 1 capsule by mouth daily.   CRANBERRY PO Take 1 capsule by mouth daily. supplement from [facility]   Multiple Vitamin (MULTIVITAMIN WITH MINERALS) TABS tablet Take 1 tablet by mouth daily. supplement from [facility]   Omega-3 Fatty Acids (FISH OIL PO) Take 1 capsule by mouth daily. supplement from [facility]   OVER THE COUNTER MEDICATION  Take 1 capsule by mouth daily. Joint support supplement from [facility]   OVER THE COUNTER MEDICATION Take 1 capsule by mouth daily. Cholesterol care supplement from [facility]   OVER THE COUNTER MEDICATION Take 1 capsule by mouth daily. Circulation and vein support supplement from [facility]   OVER THE COUNTER MEDICATION Take 1 capsule by mouth daily. Green vegetable supplement from [employer]   OVER THE COUNTER MEDICATION Take 1 capsule by mouth daily. Liver and brain supplement from [facility]   OVER THE COUNTER MEDICATION Take 1 capsule by mouth daily. Eye vitamin with lutein - supplement from [facility]   VITAMIN K PO Take 1 capsule by mouth daily. supplement from [facility]   No current facility-administered medications for this visit. (Other)   REVIEW OF SYSTEMS: ROS   Positive for: Neurological, Eyes Negative for: Constitutional, Gastrointestinal, Skin, Genitourinary, Musculoskeletal, HENT, Endocrine, Cardiovascular, Respiratory, Psychiatric, Allergic/Imm, Heme/Lymph Last edited by Sylvan Evener, COA on 09/17/2023  8:53 AM.      ALLERGIES Allergies  Allergen Reactions   Crab Extract Anaphylaxis    Eating crab causes severe allergy. Claims she is not allergic to all shellfish.    Penicillins Rash    Has patient had a PCN reaction causing immediate rash, facial/tongue/throat swelling, SOB or lightheadedness with hypotension: Yes Has patient had a PCN reaction causing  severe rash involving mucus membranes or skin necrosis: No Has patient had a PCN reaction that required hospitalization No Has patient had a PCN reaction occurring within the last 10 years: No If all of the above answers are NO, then may proceed with Cephalosporin use.    PAST MEDICAL HISTORY Past Medical History:  Diagnosis Date   Agoraphobia with panic attacks    Anxiety attack    Major depression    Superficial laceration of hand ~ 03/2015   left thumb   Past Surgical History:  Procedure Laterality Date    TONSILLECTOMY  1950's   FAMILY HISTORY Family History  Problem Relation Age of Onset   Macular degeneration Mother    SOCIAL HISTORY Social History   Tobacco Use   Smoking status: Every Day    Current packs/day: 0.33    Average packs/day: 0.3 packs/day for 40.0 years (13.2 ttl pk-yrs)    Types: Cigarettes   Smokeless tobacco: Never  Vaping Use   Vaping status: Never Used  Substance Use Topics   Alcohol use: No   Drug use: No       OPHTHALMIC EXAM:  Base Eye Exam     Visual Acuity (Snellen - Linear)       Right Left   Dist [place] 20/40 20/30   Dist ph [place] 20/30 -2 20/25 -2         Tonometry (Tonopen, 8:51 AM)       Right Left   Pressure 18 17         Pupils       Dark Light Shape React APD   Right 4 3 Round Brisk None   Left 4 3 Round Brisk None         Visual Fields (Counting fingers)       Left Right    Full Full         Extraocular Movement       Right Left    Full, Ortho Full, Ortho         Neuro/Psych     Oriented x3: Yes   Mood/Affect: Normal         Dilation     Both eyes: 1.0% Mydriacyl, 2.5% Phenylephrine @ 8:51 AM           Slit Lamp and Fundus Exam     Slit Lamp Exam       Right Left   Lids/Lashes Dermatochalasis - upper lid, mild MGD Dermatochalasis - upper lid, mild MGD   Conjunctiva/Sclera White and quiet White and quiet   Cornea mild arcus, trace PEE, trace tear film debris, well healed cataract wound with mild edema mild arcus, trace PEE, trace tear film debris   Anterior Chamber Deep, 0.5+ cell / pigment deep and clear   Iris Round and dilated Round and dilated   Lens Toric PC IOL in good position with marks at 0545 and 1145 toric PC IOL in good position with marks at 0115 and 0715   Anterior Vitreous Mild syneresis Mild syneresis, Posterior vitreous detachment, vitreous condensations         Fundus Exam       Right Left   Disc Pink and Sharp, temporal PPA, Compact Pink and Sharp, temporal PPA,  Compact   C/D Ratio 0.5 0.5   Macula Blunted foveal reflex, +CNV with pigmented RPE rip and surrounding atrophy, persistent shallow SRF, no frank heme Flat, Blunted foveal reflex, fine drusen, RPE mottling and clumping, No heme or edema  Vessels attenuated, Tortuous mild attenuation, mild tortuosity   Periphery Attached, reticular degeneration, focal pigment clumping IT, No heme Attached, reticular degeneration, focal pigment clumping IT, No heme           IMAGING AND PROCEDURES  Imaging and Procedures for 09/17/2023  OCT, Retina - OU - Both Eyes       Right Eye Quality was good. Central Foveal Thickness: 409. Progression has been stable. Findings include no IRF, no SRF, abnormal foveal contour, subretinal hyper-reflective material, pigment epithelial detachment, vitreomacular adhesion (stable improvement in IRF, persistent central PED with trace pocket of SRF temporal to PED, partial PVD).   Left Eye Quality was good. Central Foveal Thickness: 269. Progression has been stable. Findings include normal foveal contour, no IRF, no SRF, retinal drusen , epiretinal membrane, macular pucker (Very mild drusen, mild ERM with early pucker SN mac).   Notes *Images captured and stored on drive  Diagnosis / Impression:  OD: exu ARMD - stable improvement in IRF, persistent central PED with trace pocket of SRF temporal to PED, partial PVD OS: non-exu ARMD - Very mild drusen, mild ERM with early pucker SN mac  Clinical management:  See below  Abbreviations: NFP - Normal foveal profile. CME - cystoid macular edema. PED - pigment epithelial detachment. IRF - intraretinal fluid. SRF - subretinal fluid. EZ - ellipsoid zone. ERM - epiretinal membrane. ORA - outer retinal atrophy. ORT - outer retinal tubulation. SRHM - subretinal hyper-reflective material. IRHM - intraretinal hyper-reflective material      Intravitreal Injection, Pharmacologic Agent - OD - Right Eye       Time Out 09/17/2023. 9:52  AM. Confirmed correct patient, procedure, site, and patient consented.   Anesthesia Topical anesthesia was used. Anesthetic medications included Lidocaine 2%, Proparacaine 0.5%.   Procedure Preparation included 5% betadine to ocular surface, eyelid speculum. A (32g) needle was used.   Injection: 6 mg faricimab -svoa 6 MG/0.05ML (Patient supplied)   Route: Intravitreal, Site: Right Eye   NDC: 81191-478-29, Lot: [id number], Expiration date: 01/11/2025, Waste: 0 mL   Post-op Post injection exam found visual acuity of at least counting fingers. The patient tolerated the procedure well. There were no complications. The patient received written and verbal post procedure care education. Post injection medications were not given.   Notes **SAMPLE MEDICATION ADMINISTERED**           ASSESSMENT/PLAN:    ICD-10-CM   1. Exudative age-related macular degeneration of right eye with active choroidal neovascularization (HCC)  H35.3211 OCT, Retina - OU - Both Eyes    Intravitreal Injection, Pharmacologic Agent - OD - Right Eye    faricimab -svoa (VABYSMO ) 6mg /0.27mL intravitreal injection    2. 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 Epiretinal membrane (ERM) of left eye  H35.372     4. Essential hypertension  I10     5. Hypertensive retinopathy of both eyes  H35.033     6. Pseudophakia of both eyes  Z96.1      1. 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, right eye - pt presented initially on 3.16.23 with 2 wk history of central vision loss and distortion  - s/p IVA OD #1 (03.16.23), #2 (04.13.23), #3 (05.11.23), #4 (06.08.23) -- IVA resistance ========================================================== - s/p IVE OD #1 (07.06.23), #2 (08.03.23), #3 (08.31.23), #4 (10.05.23) -- IVE resistance ========================================================== - s/p IVV OD #1 (11.09.23), #2 (12.07.23), #3 (01.04.24), #4 (02.01.24), #5 (02.29.24),  #6 (03.15.24), #7 (04.25.24), #8 (05.30.24), #9 (  06.27.24), #10 (07.25.24), #11 (08.22.24), #12 (09.19.24), #13 (10.17.24), #14 (11.14.24), #15 (12.12.24), #16 (01.09.25), #17 (02.06.25 -- sample)  =========================================================== - s/p IVE HD OD #1 (03.13.25), #2 (04.10.25), #3 (05.08.25 -- sample) **history of increased IRF at 5 weeks, noted on 03.13.25 and 05.30.24 exams** - BCVA OD stable at 20/30 -- s/p CE IOL - OCT shows stable improvement in IRF, persistent central PED with trace pocket of SRF temporal to PED, partial PVD at 4 weeks - recommend IVV OD #18 [sample] today, 06.05.25 w/ f/u in 4 wks -- [employer] funding unavailable - pt wishes to proceed with injection - RBA of procedure discussed, questions answered - IVV informed consent obtained and signed, 02.06.25 (OD) - IVE HD informed consent obtained and signed, 03.13.25 (OD) - see procedure note  - f/u 4 weeks, DFE, OCT, possible injection  2. Age related macular degeneration, non-exudative, left eye  - mild/intermediate stage with mild drusen - The incidence, anatomy, and pathology of dry AMD, risk of progression, and the AREDS and AREDS 2 study including smoking risks discussed with patient.   - recommend Amsler grid monitoring  3. Epiretinal membrane, left eye  - mild ERM OS w/ early pucker, SN macula - BCVA 20/25 stable - asymptomatic, no metamorphopsia - no indication for surgery at this time - monitor for now  4,5. Hypertensive retinopathy OU - discussed importance of tight BP control - monitor  6. Pseudophakia OU  - s/p CE/IOL OU (Dr. Kirkland Peppers - OS 01.14.25, OD 04.01.25)  - toric IOLs in good position  - monitor  Ophthalmic Meds Ordered this visit:  Meds ordered this encounter  Medications   faricimab -svoa (VABYSMO ) 6mg /0.65mL intravitreal injection     Return in about 4 weeks (around 10/15/2023) for exu ARMD OD, DFE, OCT, Possible Injxn.  There are no Patient Instructions on  file for this visit.   Explained the diagnoses, plan, and follow up with the patient and they expressed understanding.  Patient expressed understanding of the importance of proper follow up care.   This document serves as a record of services personally performed by Jeanice Millard, MD, PhD. It was created on their behalf by Morley Arabia. Bevin Bucks, OA an ophthalmic technician. The creation of this record is the provider's dictation and/or activities during the visit.    Electronically signed by: Morley Arabia. Bevin Bucks, OA 09/27/23 1:27 AM   Jeanice Millard, M.D., Ph.D. Diseases & Surgery of the Retina and Vitreous [facility]  I have reviewed the above documentation for accuracy and completeness, and I agree with the above. Jeanice Millard, M.D., Ph.D. 09/27/23 1:38 AM   Abbreviations: M myopia (nearsighted); A astigmatism; H hyperopia (farsighted); P presbyopia; Mrx spectacle prescription;  CTL contact lenses; OD right eye; OS left eye; OU both eyes  XT exotropia; ET esotropia; PEK punctate epithelial keratitis; PEE punctate epithelial erosions; DES dry eye syndrome; MGD meibomian gland dysfunction; ATs artificial tears; PFAT's preservative free artificial tears; NSC nuclear sclerotic cataract; PSC posterior subcapsular cataract; ERM epi-retinal membrane; PVD posterior vitreous detachment; RD retinal detachment; DM diabetes mellitus; DR diabetic retinopathy; NPDR non-proliferative diabetic retinopathy; PDR proliferative diabetic retinopathy; CSME clinically significant macular edema; DME diabetic macular edema; dbh dot blot hemorrhages; CWS cotton wool spot; POAG primary open angle glaucoma; C/D cup-to-disc ratio; HVF humphrey visual field; GVF goldmann visual field; OCT optical coherence tomography; IOP intraocular pressure; BRVO Branch retinal vein occlusion; CRVO central retinal vein occlusion; CRAO central retinal artery occlusion; BRAO branch retinal  artery occlusion; RT retinal tear;  SB scleral buckle; PPV pars plana vitrectomy; VH Vitreous hemorrhage; PRP panretinal laser photocoagulation; IVK intravitreal kenalog; VMT vitreomacular traction; MH Macular hole;  NVD neovascularization of the disc; NVE neovascularization elsewhere; AREDS age related eye disease study; ARMD age related macular degeneration; POAG primary open angle glaucoma; EBMD epithelial/anterior basement membrane dystrophy; ACIOL anterior chamber intraocular lens; IOL intraocular lens; PCIOL posterior chamber intraocular lens; Phaco/IOL phacoemulsification with intraocular lens placement; PRK photorefractive keratectomy; LASIK laser assisted in situ keratomileusis; HTN hypertension; DM diabetes mellitus; COPD chronic obstructive pulmonary disease

## 2023-09-17 ENCOUNTER — Ambulatory Visit (INDEPENDENT_AMBULATORY_CARE_PROVIDER_SITE_OTHER): Admitting: Ophthalmology

## 2023-09-17 ENCOUNTER — Encounter (INDEPENDENT_AMBULATORY_CARE_PROVIDER_SITE_OTHER): Payer: Self-pay | Admitting: Ophthalmology

## 2023-09-17 DIAGNOSIS — I10 Essential (primary) hypertension: Secondary | ICD-10-CM | POA: Diagnosis not present

## 2023-09-17 DIAGNOSIS — H35033 Hypertensive retinopathy, bilateral: Secondary | ICD-10-CM

## 2023-09-17 DIAGNOSIS — H353122 Nonexudative age-related macular degeneration, left eye, intermediate dry stage: Secondary | ICD-10-CM | POA: Diagnosis not present

## 2023-09-17 DIAGNOSIS — H353211 Exudative age-related macular degeneration, right eye, with active choroidal neovascularization: Secondary | ICD-10-CM

## 2023-09-17 DIAGNOSIS — Z961 Presence of intraocular lens: Secondary | ICD-10-CM

## 2023-09-17 DIAGNOSIS — H35372 Puckering of macula, left eye: Secondary | ICD-10-CM

## 2023-09-17 MED ORDER — FARICIMAB-SVOA 6 MG/0.05ML IZ SOLN
6.0000 mg | INTRAVITREAL | Status: AC | PRN
Start: 2023-09-17 — End: 2023-09-17
  Administered 2023-09-17: 6 mg via INTRAVITREAL

## 2023-10-01 NOTE — Progress Notes (Shared)
 [facility] - Clinic Note  10/15/2023    CHIEF COMPLAINT Patient presents for No chief complaint on file.  HISTORY OF PRESENT ILLNESS: Kimberly Noble is a 78 y.o. female who presents to the clinic today for:     Referring physician: No referring provider defined for this encounter.  HISTORICAL INFORMATION:   Selected notes from the MEDICAL RECORD NUMBER Referred by Dr. Carloyn Chi for PED OD LEE:  Ocular Hx- PMH-    CURRENT MEDICATIONS: No current outpatient medications on file. (Ophthalmic Drugs)   No current facility-administered medications for this visit. (Ophthalmic Drugs)   Current Outpatient Medications (Other)  Medication Sig   ALPRAZolam  (XANAX ) 1 MG tablet Take 1 mg by mouth 3 (three) times daily. 7am, 1pm, 7pm   Ascorbic Acid (VITAMIN C PO) Take 1 capsule by mouth daily. supplement from [facility]   Biotin w/ Vitamins C & E (HAIR/SKIN/NAILS PO) Take 1 capsule by mouth daily. supplement from [facility]   CALCIUM CARBONATE-VITAMIN D PO Take 1 capsule by mouth daily. supplement from [facility]   Cholecalciferol (VITAMIN D3 PO) Take 1 capsule by mouth daily. supplement from [facility]   Coenzyme Q10 (COQ10 PO) Take 1 capsule by mouth daily.   CRANBERRY PO Take 1 capsule by mouth daily. supplement from [facility]   Multiple Vitamin (MULTIVITAMIN WITH MINERALS) TABS tablet Take 1 tablet by mouth daily. supplement from [facility]   Omega-3 Fatty Acids (FISH OIL PO) Take 1 capsule by mouth daily. supplement from [facility]   OVER THE COUNTER MEDICATION Take 1 capsule by mouth daily. Joint support supplement from [facility]   OVER THE COUNTER MEDICATION Take 1 capsule by mouth daily. Cholesterol care supplement from [facility]   OVER THE COUNTER MEDICATION Take 1 capsule by mouth daily. Circulation and vein support supplement from [facility]   OVER THE COUNTER MEDICATION Take 1 capsule by mouth daily. Green vegetable supplement from [employer]   OVER THE COUNTER MEDICATION Take  1 capsule by mouth daily. Liver and brain supplement from [facility]   OVER THE COUNTER MEDICATION Take 1 capsule by mouth daily. Eye vitamin with lutein - supplement from [facility]   VITAMIN K PO Take 1 capsule by mouth daily. supplement from [facility]   No current facility-administered medications for this visit. (Other)   REVIEW OF SYSTEMS:    ALLERGIES Allergies  Allergen Reactions   Crab Extract Anaphylaxis    Eating crab causes severe allergy. Claims she is not allergic to all shellfish.    Penicillins Rash    Has patient had a PCN reaction causing immediate rash, facial/tongue/throat swelling, SOB or lightheadedness with hypotension: Yes Has patient had a PCN reaction causing severe rash involving mucus membranes or skin necrosis: No Has patient had a PCN reaction that required hospitalization No Has patient had a PCN reaction occurring within the last 10 years: No If all of the above answers are NO, then may proceed with Cephalosporin use.    PAST MEDICAL HISTORY Past Medical History:  Diagnosis Date   Agoraphobia with panic attacks    Anxiety attack    Major depression    Superficial laceration of hand ~ 03/2015   left thumb   Past Surgical History:  Procedure Laterality Date   TONSILLECTOMY  1950's   FAMILY HISTORY Family History  Problem Relation Age of Onset   Macular degeneration Mother    SOCIAL HISTORY Social History   Tobacco Use  Smoking status: Every Day    Current packs/day: 0.33    Average packs/day: 0.3 packs/day for 40.0 years (13.2 ttl pk-yrs)    Types: Cigarettes   Smokeless tobacco: Never  Vaping Use   Vaping status: Never Used  Substance Use Topics   Alcohol use: No   Drug use: No       OPHTHALMIC EXAM:  Not recorded    IMAGING AND PROCEDURES  Imaging and Procedures for 10/15/2023          ASSESSMENT/PLAN:    ICD-10-CM   1. Exudative age-related macular degeneration of right eye with active choroidal neovascularization  (HCC)  H35.3211     2. 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 Epiretinal membrane (ERM) of left eye  H35.372     4. Essential hypertension  I10     5. Hypertensive retinopathy of both eyes  H35.033     6. Pseudophakia of both eyes  Z96.1      1. 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, right eye - pt presented initially on 3.16.23 with 2 wk history of central vision loss and distortion  - s/p IVA OD #1 (03.16.23), #2 (04.13.23), #3 (05.11.23), #4 (06.08.23) -- IVA resistance ========================================================== - s/p IVE OD #1 (07.06.23), #2 (08.03.23), #3 (08.31.23), #4 (10.05.23) -- IVE resistance ========================================================== - s/p IVV OD #1 (11.09.23), #2 (12.07.23), #3 (01.04.24), #4 (02.01.24), #5 (02.29.24), #6 (03.15.24), #7 (04.25.24), #8 (05.30.24), #9 (06.27.24), #10 (07.25.24), #11 (08.22.24), #12 (09.19.24), #13 (10.17.24), #14 (11.14.24), #15 (12.12.24), #16 (01.09.25), #17 (02.06.25 -- sample), #18 (06.05.25 -- sample) =========================================================== - s/p IVE HD OD #1 (03.13.25), #2 (04.10.25), #3 (05.08.25 -- sample) **history of increased IRF at 5 weeks, noted on 03.13.25 and 05.30.24 exams** - BCVA OD stable at 20/30 -- s/p CE IOL - OCT shows stable improvement in IRF, persistent central PED with trace pocket of SRF temporal to PED, partial PVD at 4 weeks - recommend IVV OD #19 [sample] today, 07.03.25 w/ f/u in 4 wks -- [employer] funding unavailable - pt wishes to proceed with injection - RBA of procedure discussed, questions answered - IVV informed consent obtained and signed, 02.06.25 (OD) - IVE HD informed consent obtained and signed, 03.13.25 (OD) - see procedure note  - f/u 4 weeks, DFE, OCT, possible injection  2. Age related macular degeneration, non-exudative, left eye  - mild/intermediate stage with mild drusen - The  incidence, anatomy, and pathology of dry AMD, risk of progression, and the AREDS and AREDS 2 study including smoking risks discussed with patient.   - recommend Amsler grid monitoring  3. Epiretinal membrane, left eye  - mild ERM OS w/ early pucker, SN macula - BCVA 20/25 stable - asymptomatic, no metamorphopsia - no indication for surgery at this time - monitor for now  4,5. Hypertensive retinopathy OU - discussed importance of tight BP control - monitor  6. Pseudophakia OU  - s/p CE/IOL OU (Dr. Kirkland Peppers - OS 01.14.25, OD 04.01.25)  - toric IOLs in good position  - monitor  Ophthalmic Meds Ordered this visit:  No orders of the defined types were placed in this encounter.    No follow-ups on file.  There are no Patient Instructions on file for this visit.   Explained the diagnoses, plan, and follow up with the patient and they expressed understanding.  Patient expressed understanding of the importance of proper follow up care.   This document serves as a record of  services personally performed by Jeanice Millard, MD, PhD. It was created on their behalf by Morley Arabia. Bevin Bucks, OA an ophthalmic technician. The creation of this record is the provider's dictation and/or activities during the visit.    Electronically signed by: Morley Arabia. Bevin Bucks, OA 10/01/23 10:06 AM    Jeanice Millard, M.D., Ph.D. Diseases & Surgery of the Retina and Vitreous [facility]    Abbreviations: M myopia (nearsighted); A astigmatism; H hyperopia (farsighted); P presbyopia; Mrx spectacle prescription;  CTL contact lenses; OD right eye; OS left eye; OU both eyes  XT exotropia; ET esotropia; PEK punctate epithelial keratitis; PEE punctate epithelial erosions; DES dry eye syndrome; MGD meibomian gland dysfunction; ATs artificial tears; PFAT's preservative free artificial tears; NSC nuclear sclerotic cataract; PSC posterior subcapsular cataract; ERM epi-retinal membrane; PVD posterior  vitreous detachment; RD retinal detachment; DM diabetes mellitus; DR diabetic retinopathy; NPDR non-proliferative diabetic retinopathy; PDR proliferative diabetic retinopathy; CSME clinically significant macular edema; DME diabetic macular edema; dbh dot blot hemorrhages; CWS cotton wool spot; POAG primary open angle glaucoma; C/D cup-to-disc ratio; HVF humphrey visual field; GVF goldmann visual field; OCT optical coherence tomography; IOP intraocular pressure; BRVO Branch retinal vein occlusion; CRVO central retinal vein occlusion; CRAO central retinal artery occlusion; BRAO branch retinal artery occlusion; RT retinal tear; SB scleral buckle; PPV pars plana vitrectomy; VH Vitreous hemorrhage; PRP panretinal laser photocoagulation; IVK intravitreal kenalog; VMT vitreomacular traction; MH Macular hole;  NVD neovascularization of the disc; NVE neovascularization elsewhere; AREDS age related eye disease study; ARMD age related macular degeneration; POAG primary open angle glaucoma; EBMD epithelial/anterior basement membrane dystrophy; ACIOL anterior chamber intraocular lens; IOL intraocular lens; PCIOL posterior chamber intraocular lens; Phaco/IOL phacoemulsification with intraocular lens placement; PRK photorefractive keratectomy; LASIK laser assisted in situ keratomileusis; HTN hypertension; DM diabetes mellitus; COPD chronic obstructive pulmonary disease

## 2023-10-15 ENCOUNTER — Encounter (INDEPENDENT_AMBULATORY_CARE_PROVIDER_SITE_OTHER): Admitting: Ophthalmology

## 2023-10-15 DIAGNOSIS — H35372 Puckering of macula, left eye: Secondary | ICD-10-CM

## 2023-10-15 DIAGNOSIS — Z961 Presence of intraocular lens: Secondary | ICD-10-CM

## 2023-10-15 DIAGNOSIS — H353211 Exudative age-related macular degeneration, right eye, with active choroidal neovascularization: Secondary | ICD-10-CM

## 2023-10-15 DIAGNOSIS — H35033 Hypertensive retinopathy, bilateral: Secondary | ICD-10-CM

## 2023-10-15 DIAGNOSIS — I10 Essential (primary) hypertension: Secondary | ICD-10-CM

## 2023-10-15 DIAGNOSIS — H353122 Nonexudative age-related macular degeneration, left eye, intermediate dry stage: Secondary | ICD-10-CM

## 2023-10-15 NOTE — Progress Notes (Shared)
 [facility] - Clinic Note  10/20/2023    CHIEF COMPLAINT Patient presents for No chief complaint on file.  HISTORY OF PRESENT ILLNESS: Kimberly Noble is a 78 y.o. female who presents to the clinic today for:     Referring physician: No referring provider defined for this encounter.  HISTORICAL INFORMATION:   Selected notes from the MEDICAL RECORD NUMBER Referred by Dr. [name] for PED OD LEE:  Ocular Hx- PMH-    CURRENT MEDICATIONS: No current outpatient medications on file. (Ophthalmic Drugs)   No current facility-administered medications for this visit. (Ophthalmic Drugs)   Current Outpatient Medications (Other)  Medication Sig   ALPRAZolam  (XANAX ) 1 MG tablet Take 1 mg by mouth 3 (three) times daily. 7am, 1pm, 7pm   Ascorbic Acid (VITAMIN C PO) Take 1 capsule by mouth daily. supplement from [facility]   Biotin w/ Vitamins C & E (HAIR/SKIN/NAILS PO) Take 1 capsule by mouth daily. supplement from [facility]   CALCIUM CARBONATE-VITAMIN D PO Take 1 capsule by mouth daily. supplement from [facility]   Cholecalciferol (VITAMIN D3 PO) Take 1 capsule by mouth daily. supplement from [facility]   Coenzyme Q10 (COQ10 PO) Take 1 capsule by mouth daily.   CRANBERRY PO Take 1 capsule by mouth daily. supplement from [facility]   Multiple Vitamin (MULTIVITAMIN WITH MINERALS) TABS tablet Take 1 tablet by mouth daily. supplement from [facility]   Omega-3 Fatty Acids (FISH OIL PO) Take 1 capsule by mouth daily. supplement from [facility]   OVER THE COUNTER MEDICATION Take 1 capsule by mouth daily. Joint support supplement from [facility]   OVER THE COUNTER MEDICATION Take 1 capsule by mouth daily. Cholesterol care supplement from [facility]   OVER THE COUNTER MEDICATION Take 1 capsule by mouth daily. Circulation and vein support supplement from [facility]   OVER THE COUNTER MEDICATION Take 1 capsule by mouth daily. Green vegetable supplement from [employer]   OVER THE COUNTER MEDICATION Take  1 capsule by mouth daily. Liver and brain supplement from [facility]   OVER THE COUNTER MEDICATION Take 1 capsule by mouth daily. Eye vitamin with lutein - supplement from [facility]   VITAMIN K PO Take 1 capsule by mouth daily. supplement from [facility]   No current facility-administered medications for this visit. (Other)   REVIEW OF SYSTEMS:    ALLERGIES Allergies  Allergen Reactions   Crab Extract Anaphylaxis    Eating crab causes severe allergy. Claims she is not allergic to all shellfish.    Penicillins Rash    Has patient had a PCN reaction causing immediate rash, facial/tongue/throat swelling, SOB or lightheadedness with hypotension: Yes Has patient had a PCN reaction causing severe rash involving mucus membranes or skin necrosis: No Has patient had a PCN reaction that required hospitalization No Has patient had a PCN reaction occurring within the last 10 years: No If all of the above answers are NO, then may proceed with Cephalosporin use.    PAST MEDICAL HISTORY Past Medical History:  Diagnosis Date   Agoraphobia with panic attacks    Anxiety attack    Major depression    Superficial laceration of hand ~ 03/2015   left thumb   Past Surgical History:  Procedure Laterality Date   TONSILLECTOMY  1950's   FAMILY HISTORY Family History  Problem Relation Age of Onset   Macular degeneration Mother    SOCIAL HISTORY Social History   Tobacco Use  Smoking status: Every Day    Current packs/day: 0.33    Average packs/day: 0.3 packs/day for 40.0 years (13.2 ttl pk-yrs)    Types: Cigarettes   Smokeless tobacco: Never  Vaping Use   Vaping status: Never Used  Substance Use Topics   Alcohol use: No   Drug use: No       OPHTHALMIC EXAM:  Not recorded    IMAGING AND PROCEDURES  Imaging and Procedures for 10/20/2023          ASSESSMENT/PLAN:    ICD-10-CM   1. Exudative age-related macular degeneration of right eye with active choroidal neovascularization  (HCC)  H35.3211     2. 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 Epiretinal membrane (ERM) of left eye  H35.372     4. Essential hypertension  I10     5. Hypertensive retinopathy of both eyes  H35.033     6. Pseudophakia of both eyes  Z96.1     7. Combined forms of age-related cataract of right eye  H25.811     8. Pseudophakia  Z96.1       1. 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, right eye - pt presented initially on 3.16.23 with 2 wk history of central vision loss and distortion  - s/p IVA OD #1 (03.16.23), #2 (04.13.23), #3 (05.11.23), #4 (06.08.23) -- IVA resistance ========================================================== - s/p IVE OD #1 (07.06.23), #2 (08.03.23), #3 (08.31.23), #4 (10.05.23) -- IVE resistance ========================================================== - s/p IVV OD #1 (11.09.23), #2 (12.07.23), #3 (01.04.24), #4 (02.01.24), #5 (02.29.24), #6 (03.15.24), #7 (04.25.24), #8 (05.30.24), #9 (06.27.24), #10 (07.25.24), #11 (08.22.24), #12 (09.19.24), #13 (10.17.24), #14 (11.14.24), #15 (12.12.24), #16 (01.09.25), #17 (02.06.25 -- sample), #18 (06.05.25) [sample] =========================================================== - s/p IVE HD OD #1 (03.13.25), #2 (04.10.25), #3 (05.08.25 -- sample) **history of increased IRF at 5 weeks, noted on 03.13.25 and 05.30.24 exams** - BCVA OD stable at 20/30 -- s/p CE IOL - OCT shows stable improvement in IRF, persistent central PED with trace pocket of SRF temporal to PED, partial PVD at 4 weeks - recommend IVV OD #19 [sample] today, 07.08.25 w/ f/u in 4 wks -- [employer] funding unavailable - pt wishes to proceed with injection - RBA of procedure discussed, questions answered - IVV informed consent obtained and signed, 02.06.25 (OD) - IVE HD informed consent obtained and signed, 03.13.25 (OD) - see procedure note  - f/u 4 weeks, DFE, OCT, possible injection  2. Age related  macular degeneration, non-exudative, left eye  - mild/intermediate stage with mild drusen - The incidence, anatomy, and pathology of dry AMD, risk of progression, and the AREDS and AREDS 2 study including smoking risks discussed with patient.   - recommend Amsler grid monitoring  3. Epiretinal membrane, left eye  - mild ERM OS w/ early pucker, SN macula - BCVA 20/25 stable - asymptomatic, no metamorphopsia - no indication for surgery at this time - monitor for now  4,5. Hypertensive retinopathy OU - discussed importance of tight BP control - monitor  6. Pseudophakia OU  - s/p CE/IOL OU (Dr. Meridee - OS 01.14.25, OD 04.01.25)  - toric IOLs in good position  - monitor  Ophthalmic Meds Ordered this visit:  No orders of the defined types were placed in this encounter.    No follow-ups on file.  There are no Patient Instructions on file for this visit.   Explained the diagnoses, plan, and follow up with the patient and they  expressed understanding.  Patient expressed understanding of the importance of proper follow up care.   This document serves as a record of services personally performed by Redell JUDITHANN Hans, MD, PhD. It was created on their behalf by Auston Muzzy, COMT. The creation of this record is the provider's dictation and/or activities during the visit.  Electronically signed by: Auston Muzzy, COMT 10/15/23 2:40 PM     Redell JUDITHANN Hans, M.D., Ph.D. Diseases & Surgery of the Retina and Vitreous [facility]   Abbreviations: M myopia (nearsighted); A astigmatism; H hyperopia (farsighted); P presbyopia; Mrx spectacle prescription;  CTL contact lenses; OD right eye; OS left eye; OU both eyes  XT exotropia; ET esotropia; PEK punctate epithelial keratitis; PEE punctate epithelial erosions; DES dry eye syndrome; MGD meibomian gland dysfunction; ATs artificial tears; PFAT's preservative free artificial tears; NSC nuclear sclerotic cataract; PSC  posterior subcapsular cataract; ERM epi-retinal membrane; PVD posterior vitreous detachment; RD retinal detachment; DM diabetes mellitus; DR diabetic retinopathy; NPDR non-proliferative diabetic retinopathy; PDR proliferative diabetic retinopathy; CSME clinically significant macular edema; DME diabetic macular edema; dbh dot blot hemorrhages; CWS cotton wool spot; POAG primary open angle glaucoma; C/D cup-to-disc ratio; HVF humphrey visual field; GVF goldmann visual field; OCT optical coherence tomography; IOP intraocular pressure; BRVO Branch retinal vein occlusion; CRVO central retinal vein occlusion; CRAO central retinal artery occlusion; BRAO branch retinal artery occlusion; RT retinal tear; SB scleral buckle; PPV pars plana vitrectomy; VH Vitreous hemorrhage; PRP panretinal laser photocoagulation; IVK intravitreal kenalog; VMT vitreomacular traction; MH Macular hole;  NVD neovascularization of the disc; NVE neovascularization elsewhere; AREDS age related eye disease study; ARMD age related macular degeneration; POAG primary open angle glaucoma; EBMD epithelial/anterior basement membrane dystrophy; ACIOL anterior chamber intraocular lens; IOL intraocular lens; PCIOL posterior chamber intraocular lens; Phaco/IOL phacoemulsification with intraocular lens placement; PRK photorefractive keratectomy; LASIK laser assisted in situ keratomileusis; HTN hypertension; DM diabetes mellitus; COPD chronic obstructive pulmonary disease

## 2023-10-20 ENCOUNTER — Encounter (INDEPENDENT_AMBULATORY_CARE_PROVIDER_SITE_OTHER): Admitting: Ophthalmology

## 2023-10-20 DIAGNOSIS — Z961 Presence of intraocular lens: Secondary | ICD-10-CM

## 2023-10-20 DIAGNOSIS — I10 Essential (primary) hypertension: Secondary | ICD-10-CM

## 2023-10-20 DIAGNOSIS — H35372 Puckering of macula, left eye: Secondary | ICD-10-CM

## 2023-10-20 DIAGNOSIS — H25811 Combined forms of age-related cataract, right eye: Secondary | ICD-10-CM

## 2023-10-20 DIAGNOSIS — H35033 Hypertensive retinopathy, bilateral: Secondary | ICD-10-CM

## 2023-10-20 DIAGNOSIS — H353211 Exudative age-related macular degeneration, right eye, with active choroidal neovascularization: Secondary | ICD-10-CM

## 2023-10-20 DIAGNOSIS — H353122 Nonexudative age-related macular degeneration, left eye, intermediate dry stage: Secondary | ICD-10-CM

## 2023-10-22 NOTE — Progress Notes (Signed)
 [facility] - Clinic Note  10/23/2023    CHIEF COMPLAINT Patient presents for Retina Follow Up  HISTORY OF PRESENT ILLNESS: Kimberly Noble is a 78 y.o. female who presents to the clinic today for:   HPI     Retina Follow Up   Patient presents with  Wet AMD.  In right eye.  This started 4 weeks ago.  I, the attending physician,  performed the HPI with the patient and updated documentation appropriately.        Comments   Patient here for 4 weeks retina follow up for exu ARMD OD. Patient states vision about the same. No eye pain.       Last edited by Rosalita Carey, MD on 10/23/2023 11:41 AM.    Pt is delayed to follow up from 4 weeks to 5 weeks due to having vertigo last week, she states she still needs to call Dr. Meridee so she can have Yag  Referring physician: No referring provider defined for this encounter.  HISTORICAL INFORMATION:   Selected notes from the MEDICAL RECORD NUMBER Referred by Dr. [name] for PED OD LEE:  Ocular Hx- PMH-    CURRENT MEDICATIONS: No current outpatient medications on file. (Ophthalmic Drugs)   No current facility-administered medications for this visit. (Ophthalmic Drugs)   Current Outpatient Medications (Other)  Medication Sig   ALPRAZolam  (XANAX ) 1 MG tablet Take 1 mg by mouth 3 (three) times daily. 7am, 1pm, 7pm   Ascorbic Acid (VITAMIN C PO) Take 1 capsule by mouth daily. supplement from [facility]   Biotin w/ Vitamins C & E (HAIR/SKIN/NAILS PO) Take 1 capsule by mouth daily. supplement from [facility]   CALCIUM CARBONATE-VITAMIN D PO Take 1 capsule by mouth daily. supplement from [facility]   Cholecalciferol (VITAMIN D3 PO) Take 1 capsule by mouth daily. supplement from [facility]   Coenzyme Q10 (COQ10 PO) Take 1 capsule by mouth daily.   CRANBERRY PO Take 1 capsule by mouth daily. supplement from [facility]   Multiple Vitamin (MULTIVITAMIN WITH MINERALS) TABS tablet Take 1 tablet by mouth daily. supplement from  [facility]   Omega-3 Fatty Acids (FISH OIL PO) Take 1 capsule by mouth daily. supplement from [facility]   OVER THE COUNTER MEDICATION Take 1 capsule by mouth daily. Joint support supplement from [facility]   OVER THE COUNTER MEDICATION Take 1 capsule by mouth daily. Cholesterol care supplement from [facility]   OVER THE COUNTER MEDICATION Take 1 capsule by mouth daily. Circulation and vein support supplement from [facility]   OVER THE COUNTER MEDICATION Take 1 capsule by mouth daily. Green vegetable supplement from [employer]   OVER THE COUNTER MEDICATION Take 1 capsule by mouth daily. Liver and brain supplement from [facility]   OVER THE COUNTER MEDICATION Take 1 capsule by mouth daily. Eye vitamin with lutein - supplement from [facility]   VITAMIN K PO Take 1 capsule by mouth daily. supplement from [facility]   No current facility-administered medications for this visit. (Other)   REVIEW OF SYSTEMS: ROS   Positive for: Neurological, Eyes Negative for: Constitutional, Gastrointestinal, Skin, Genitourinary, Musculoskeletal, HENT, Endocrine, Cardiovascular, Respiratory, Psychiatric, Allergic/Imm, Heme/Lymph Last edited by Orval Asberry GORMAN, COA on 10/23/2023  9:03 AM.       ALLERGIES Allergies  Allergen Reactions   Crab Extract Anaphylaxis    Eating crab causes severe allergy. Claims she is not allergic to all shellfish.    Penicillins  Rash    Has patient had a PCN reaction causing immediate rash, facial/tongue/throat swelling, SOB or lightheadedness with hypotension: Yes Has patient had a PCN reaction causing severe rash involving mucus membranes or skin necrosis: No Has patient had a PCN reaction that required hospitalization No Has patient had a PCN reaction occurring within the last 10 years: No If all of the above answers are NO, then may proceed with Cephalosporin use.    PAST MEDICAL HISTORY Past Medical History:  Diagnosis Date   Agoraphobia with panic attacks    Anxiety attack    Major  depression    Superficial laceration of hand ~ 03/2015   left thumb   Past Surgical History:  Procedure Laterality Date   TONSILLECTOMY  1950's   FAMILY HISTORY Family History  Problem Relation Age of Onset   Macular degeneration Mother    SOCIAL HISTORY Social History   Tobacco Use   Smoking status: Every Day    Current packs/day: 0.33    Average packs/day: 0.3 packs/day for 40.0 years (13.2 ttl pk-yrs)    Types: Cigarettes   Smokeless tobacco: Never  Vaping Use   Vaping status: Never Used  Substance Use Topics   Alcohol use: No   Drug use: No       OPHTHALMIC EXAM:  Base Eye Exam     Visual Acuity (Snellen - Linear)       Right Left   Dist [place] 20/40 20/30 -1         Tonometry (Tonopen, 9:00 AM)       Right Left   Pressure 16 15         Pupils       Dark Light Shape React APD   Right 4 3 Round Brisk None   Left 4 3 Round Brisk None         Visual Fields (Counting fingers)       Left Right    Full Full         Extraocular Movement       Right Left    Full, Ortho Full, Ortho         Neuro/Psych     Oriented x3: Yes   Mood/Affect: Normal         Dilation     Both eyes: 1.0% Mydriacyl, 2.5% Phenylephrine @ 9:00 AM           Slit Lamp and Fundus Exam     Slit Lamp Exam       Right Left   Lids/Lashes Dermatochalasis - upper lid, mild MGD Dermatochalasis - upper lid, mild MGD   Conjunctiva/Sclera White and quiet White and quiet   Cornea mild arcus, trace PEE, trace tear film debris, well healed cataract wound with mild edema mild arcus, trace PEE, trace tear film debris   Anterior Chamber Deep, 0.5+ cell / pigment deep and clear   Iris Round and dilated Round and dilated   Lens Toric PC IOL in good position with marks at 0545 and 1145 toric PC IOL in good position with marks at 0115 and 0715   Anterior Vitreous Mild syneresis Mild syneresis, Posterior vitreous detachment, vitreous condensations         Fundus Exam        Right Left   Disc Pink and Sharp, temporal PPA, Compact Pink and Sharp, temporal PPA, Compact   C/D Ratio 0.5 0.5   Macula Blunted foveal reflex, +CNV with pigmented RPE rip and surrounding atrophy,  persistent shallow SRF -- slightly increased, no frank heme Flat, Blunted foveal reflex, fine drusen, RPE mottling and clumping, No heme or edema   Vessels attenuated, Tortuous mild attenuation, mild tortuosity   Periphery Attached, reticular degeneration, focal pigment clumping IT, No heme Attached, reticular degeneration, focal pigment clumping IT, No heme           IMAGING AND PROCEDURES  Imaging and Procedures for 10/23/2023  OCT, Retina - OU - Both Eyes       Right Eye Quality was good. Central Foveal Thickness: 413. Progression has worsened. Findings include no IRF, no SRF, abnormal foveal contour, subretinal hyper-reflective material, pigment epithelial detachment, vitreomacular adhesion (Stable improvement in IRF, persistent central PED with mild interval increase in surrounding SRF, partial PVD).   Left Eye Quality was good. Central Foveal Thickness: 283. Progression has been stable. Findings include normal foveal contour, no IRF, no SRF, retinal drusen , epiretinal membrane, macular pucker (Very mild drusen, mild ERM with early pucker SN mac).   Notes *Images captured and stored on drive  Diagnosis / Impression:  OD: exu ARMD - Stable improvement in IRF, persistent central PED with mild interval increase in surrounding SRF, partial PVD OS: non-exu ARMD - Very mild drusen, mild ERM with early pucker SN mac  Clinical management:  See below  Abbreviations: NFP - Normal foveal profile. CME - cystoid macular edema. PED - pigment epithelial detachment. IRF - intraretinal fluid. SRF - subretinal fluid. EZ - ellipsoid zone. ERM - epiretinal membrane. ORA - outer retinal atrophy. ORT - outer retinal tubulation. SRHM - subretinal hyper-reflective material. IRHM - intraretinal  hyper-reflective material      Intravitreal Injection, Pharmacologic Agent - OD - Right Eye       Time Out 10/23/2023. 9:53 AM. Confirmed correct patient, procedure, site, and patient consented.   Anesthesia Topical anesthesia was used. Anesthetic medications included Lidocaine 2%, Proparacaine 0.5%.   Procedure Preparation included 5% betadine to ocular surface, eyelid speculum. A (32g) needle was used.   Injection: 8 mg aflibercept  8 MG/0.07ML (Patient supplied)   Route: Intravitreal, Site: Right Eye   NDC: [id number], Lot: [id number], Expiration date: 02/12/2024, Waste: 0 mL   Post-op Post injection exam found visual acuity of at least counting fingers. The patient tolerated the procedure well. There were no complications. The patient received written and verbal post procedure care education. Post injection medications were not given.   Notes **SAMPLE MEDICATION ADMINISTERED**           ASSESSMENT/PLAN:    ICD-10-CM   1. Exudative age-related macular degeneration of right eye with active choroidal neovascularization (HCC)  H35.3211 OCT, Retina - OU - Both Eyes    Intravitreal Injection, Pharmacologic Agent - OD - Right Eye    aflibercept  (EYLEA  HD) ophthalmic injection 8 mg    2. 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 Epiretinal membrane (ERM) of left eye  H35.372     4. Essential hypertension  I10     5. Hypertensive retinopathy of both eyes  H35.033     6. Pseudophakia of both eyes  Z96.1      1. 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, right eye - pt presented initially on 3.16.23 with 2 wk history of central vision loss and distortion  - s/p IVA OD #1 (03.16.23), #2 (04.13.23), #3 (05.11.23), #4 (06.08.23) -- IVA resistance ============================== - s/p IVE OD #1 (07.06.23), #2 (08.03.23), #3 (08.31.23), #4 (10.05.23) -- IVE  resistance =============================== - s/p IVV OD #1  (11.09.23), #2 (12.07.23), #3 (01.04.24), #4 (02.01.24), #5 (02.29.24), #6 (03.15.24), #7 (04.25.24), #8 (05.30.24), #9 (06.27.24), #10 (07.25.24), #11 (08.22.24), #12 (09.19.24), #13 (10.17.24), #14 (11.14.24), #15 (12.12.24), #16 (01.09.25), #17 (02.06.25 -- sample), #18 (06.05.25) [sample] ================================ - s/p IVE HD OD #1 (03.13.25), #2 (04.10.25), #3 (05.08.25 -- sample) **history of increased IRF at 5 weeks, noted on 07.11.25, 03.13.25 and 05.30.24 exams** - BCVA OD decreased to 20/40 from 20/30 - OCT shows stable improvement in IRF, persistent central PED with mild interval increase in surrounding SRF, partial PVD at 5 weeks - recommend IVE HD OD #4 [sample] today, 07.10.25 w/ f/u back to 4 wks -- [employer] funding unavailable **currently alternating IVE HD and IVV OD** - pt wishes to proceed with injection - RBA of procedure discussed, questions answered - IVV informed consent obtained and signed, 02.06.25 (OD) - IVE HD informed consent obtained and signed, 03.13.25 (OD) - see procedure note  - f/u 4 weeks, DFE, OCT, possible injection  2. Age related macular degeneration, non-exudative, left eye  - mild/intermediate stage with mild drusen - The incidence, anatomy, and pathology of dry AMD, risk of progression, and the AREDS and AREDS 2 study including smoking risks discussed with patient.   - recommend Amsler grid monitoring  3. Epiretinal membrane, left eye  - mild ERM OS w/ early pucker, SN macula - BCVA 20/30 -- decreased - asymptomatic, no metamorphopsia - no indication for surgery at this time - monitor for now  4,5. Hypertensive retinopathy OU - discussed importance of tight BP control - monitor  6. Pseudophakia OU  - s/p CE/IOL OU (Dr. Meridee - OS 01.14.25, OD 04.01.25)  - toric IOLs in good position  - monitor  Ophthalmic Meds Ordered this visit:  Meds ordered this encounter  Medications   aflibercept  (EYLEA  HD) ophthalmic injection 8 mg      Return in about 4 weeks (around 11/20/2023) for f/u exu ARMD OD, DFE, OCT, Possible Injxn.  There are no Patient Instructions on file for this visit.   This document serves as a record of services personally performed by Redell JUDITHANN Hans, MD, PhD. It was created on their behalf by Delon Newness COT, an ophthalmic technician. The creation of this record is the provider's dictation and/or activities during the visit.    Electronically signed by: Delon Newness COT 07.10.2025  12:01 PM  This document serves as a record of services personally performed by Redell JUDITHANN Hans, MD, PhD. It was created on their behalf by Alan PARAS. Delores, OA an ophthalmic technician. The creation of this record is the provider's dictation and/or activities during the visit.    Electronically signed by: Alan PARAS. Delores, OA 10/23/23 12:01 PM  Redell JUDITHANN Hans, M.D., Ph.D. Diseases & Surgery of the Retina and Vitreous [facility]  I have reviewed the above documentation for accuracy and completeness, and I agree with the above. Redell JUDITHANN Hans, M.D., Ph.D. 10/23/23 12:02 PM   Abbreviations: M myopia (nearsighted); A astigmatism; H hyperopia (farsighted); P presbyopia; Mrx spectacle prescription;  CTL contact lenses; OD right eye; OS left eye; OU both eyes  XT exotropia; ET esotropia; PEK punctate epithelial keratitis; PEE punctate epithelial erosions; DES dry eye syndrome; MGD meibomian gland dysfunction; ATs artificial tears; PFAT's preservative free artificial tears; NSC nuclear sclerotic cataract; PSC posterior subcapsular cataract; ERM epi-retinal membrane; PVD posterior vitreous detachment; RD retinal detachment; DM diabetes mellitus; DR diabetic retinopathy; NPDR  non-proliferative diabetic retinopathy; PDR proliferative diabetic retinopathy; CSME clinically significant macular edema; DME diabetic macular edema; dbh dot blot hemorrhages; CWS cotton wool spot; POAG primary open angle  glaucoma; C/D cup-to-disc ratio; HVF humphrey visual field; GVF goldmann visual field; OCT optical coherence tomography; IOP intraocular pressure; BRVO Branch retinal vein occlusion; CRVO central retinal vein occlusion; CRAO central retinal artery occlusion; BRAO branch retinal artery occlusion; RT retinal tear; SB scleral buckle; PPV pars plana vitrectomy; VH Vitreous hemorrhage; PRP panretinal laser photocoagulation; IVK intravitreal kenalog; VMT vitreomacular traction; MH Macular hole;  NVD neovascularization of the disc; NVE neovascularization elsewhere; AREDS age related eye disease study; ARMD age related macular degeneration; POAG primary open angle glaucoma; EBMD epithelial/anterior basement membrane dystrophy; ACIOL anterior chamber intraocular lens; IOL intraocular lens; PCIOL posterior chamber intraocular lens; Phaco/IOL phacoemulsification with intraocular lens placement; PRK photorefractive keratectomy; LASIK laser assisted in situ keratomileusis; HTN hypertension; DM diabetes mellitus; COPD chronic obstructive pulmonary disease

## 2023-10-23 ENCOUNTER — Ambulatory Visit (INDEPENDENT_AMBULATORY_CARE_PROVIDER_SITE_OTHER): Admitting: Ophthalmology

## 2023-10-23 ENCOUNTER — Encounter (INDEPENDENT_AMBULATORY_CARE_PROVIDER_SITE_OTHER): Payer: Self-pay | Admitting: Ophthalmology

## 2023-10-23 DIAGNOSIS — H35033 Hypertensive retinopathy, bilateral: Secondary | ICD-10-CM

## 2023-10-23 DIAGNOSIS — H353211 Exudative age-related macular degeneration, right eye, with active choroidal neovascularization: Secondary | ICD-10-CM

## 2023-10-23 DIAGNOSIS — Z961 Presence of intraocular lens: Secondary | ICD-10-CM

## 2023-10-23 DIAGNOSIS — I10 Essential (primary) hypertension: Secondary | ICD-10-CM

## 2023-10-23 DIAGNOSIS — H353122 Nonexudative age-related macular degeneration, left eye, intermediate dry stage: Secondary | ICD-10-CM | POA: Diagnosis not present

## 2023-10-23 DIAGNOSIS — H35372 Puckering of macula, left eye: Secondary | ICD-10-CM

## 2023-10-23 MED ORDER — AFLIBERCEPT 8 MG/0.07ML IZ SOLN
8.0000 mg | INTRAVITREAL | Status: AC | PRN
  Administered 2023-10-23: 8 mg via INTRAVITREAL

## 2023-11-12 NOTE — Progress Notes (Signed)
 [facility] - Clinic Note  11/26/2023    CHIEF COMPLAINT Patient presents for Retina Follow Up  HISTORY OF PRESENT ILLNESS: Kimberly Noble is a 78 y.o. female who presents to the clinic today for:   HPI     Retina Follow Up   Patient presents with  Wet AMD.  In right eye.  This started 2.5 years ago.  Severity is moderate.  Duration of 5 weeks.  Since onset it is stable.  I, the attending physician,  performed the HPI with the patient and updated documentation appropriately.        Comments   Pt states left eye seems to be worse in DVA. Pt had vertigo and had to delay last vision. Right eye has a pulling sensation but not painful, this has been off and on for the past 5 days. Pt denies FOL/floaters/pain.      Last edited by Kimberly Rogue, MD on 11/26/2023 11:26 AM.     Pt states she's doing ok, struggling w/ chronic anxiety and depression.   Referring physician: No referring provider defined for this encounter.  HISTORICAL INFORMATION:   Selected notes from the MEDICAL RECORD NUMBER Referred by Dr. [name] for PED OD LEE:  Ocular Hx- PMH-    CURRENT MEDICATIONS: No current outpatient medications on file. (Ophthalmic Drugs)   No current facility-administered medications for this visit. (Ophthalmic Drugs)   Current Outpatient Medications (Other)  Medication Sig   ALPRAZolam  (XANAX ) 1 MG tablet Take 1 mg by mouth 3 (three) times daily. 7am, 1pm, 7pm   Ascorbic Acid (VITAMIN C PO) Take 1 capsule by mouth daily. supplement from [facility]   Biotin w/ Vitamins C & E (HAIR/SKIN/NAILS PO) Take 1 capsule by mouth daily. supplement from [facility]   CALCIUM CARBONATE-VITAMIN D PO Take 1 capsule by mouth daily. supplement from [facility]   Cholecalciferol (VITAMIN D3 PO) Take 1 capsule by mouth daily. supplement from [facility]   Coenzyme Q10 (COQ10 PO) Take 1 capsule by mouth daily.   CRANBERRY PO Take 1 capsule by mouth daily. supplement from [facility]    Multiple Vitamin (MULTIVITAMIN WITH MINERALS) TABS tablet Take 1 tablet by mouth daily. supplement from [facility]   Omega-3 Fatty Acids (FISH OIL PO) Take 1 capsule by mouth daily. supplement from [facility]   OVER THE COUNTER MEDICATION Take 1 capsule by mouth daily. Joint support supplement from [facility]   OVER THE COUNTER MEDICATION Take 1 capsule by mouth daily. Cholesterol care supplement from [facility]   OVER THE COUNTER MEDICATION Take 1 capsule by mouth daily. Circulation and vein support supplement from [facility]   OVER THE COUNTER MEDICATION Take 1 capsule by mouth daily. Green vegetable supplement from [employer]   OVER THE COUNTER MEDICATION Take 1 capsule by mouth daily. Liver and brain supplement from [facility]   OVER THE COUNTER MEDICATION Take 1 capsule by mouth daily. Eye vitamin with lutein - supplement from [facility]   VITAMIN K PO Take 1 capsule by mouth daily. supplement from [facility]   No current facility-administered medications for this visit. (Other)   REVIEW OF SYSTEMS: ROS   Positive for: Neurological, Eyes Negative for: Constitutional, Gastrointestinal, Skin, Genitourinary, Musculoskeletal, HENT, Endocrine, Cardiovascular, Respiratory, Psychiatric, Allergic/Imm, Heme/Lymph Last edited by Kimberly Noble, COT on 11/26/2023 10:01 AM.        ALLERGIES Allergies  Allergen Reactions   Crab Extract Anaphylaxis    Eating  crab causes severe allergy. Claims she is not allergic to all shellfish.    Penicillins Rash    Has patient had a PCN reaction causing immediate rash, facial/tongue/throat swelling, SOB or lightheadedness with hypotension: Yes Has patient had a PCN reaction causing severe rash involving mucus membranes or skin necrosis: No Has patient had a PCN reaction that required hospitalization No Has patient had a PCN reaction occurring within the last 10 years: No If all of the above answers are NO, then may proceed with Cephalosporin use.    PAST MEDICAL  HISTORY Past Medical History:  Diagnosis Date   Agoraphobia with panic attacks    Anxiety attack    Major depression    Superficial laceration of hand ~ 03/2015   left thumb   Past Surgical History:  Procedure Laterality Date   TONSILLECTOMY  1950's   FAMILY HISTORY Family History  Problem Relation Age of Onset   Macular degeneration Mother    SOCIAL HISTORY Social History   Tobacco Use   Smoking status: Every Day    Current packs/day: 0.33    Average packs/day: 0.3 packs/day for 40.0 years (13.2 ttl pk-yrs)    Types: Cigarettes   Smokeless tobacco: Never  Vaping Use   Vaping status: Never Used  Substance Use Topics   Alcohol use: No   Drug use: No       OPHTHALMIC EXAM:  Base Eye Exam     Visual Acuity (Snellen - Linear)       Right Left   Dist [place] 20/40 20/30 +1   Dist ph [place] 30-3 20/25 -2         Tonometry (Tonopen, 10:08 AM)       Right Left   Pressure 19 16         Pupils       Pupils Dark Light Shape React APD   Right PERRL 3 2 Round Brisk None   Left PERRL 3 2 Round Brisk None         Visual Fields       Left Right    Full Full         Extraocular Movement       Right Left    Full, Ortho Full, Ortho         Neuro/Psych     Oriented x3: Yes   Mood/Affect: Normal         Dilation     Both eyes: 1.0% Mydriacyl, 2.5% Phenylephrine @ 10:08 AM           Slit Lamp and Fundus Exam     Slit Lamp Exam       Right Left   Lids/Lashes Dermatochalasis - upper lid, mild MGD Dermatochalasis - upper lid, mild MGD   Conjunctiva/Sclera White and quiet White and quiet   Cornea mild arcus, trace PEE, trace tear film debris, well healed cataract wound with mild edema mild arcus, trace PEE, trace tear film debris   Anterior Chamber Deep, 0.5+ cell / pigment deep and clear   Iris Round and dilated Round and dilated   Lens Toric PC IOL in good position with marks at 0545 and 1145 toric PC IOL in good position with marks at 0115  and 0715   Anterior Vitreous Mild syneresis Mild syneresis, Posterior vitreous detachment, vitreous condensations         Fundus Exam       Right Left   Disc Pink and Sharp, temporal PPA, Compact Pink  and Sharp, temporal PPA, Compact   C/D Ratio 0.5 0.5   Macula Blunted foveal reflex, +CNV with pigmented RPE rip and surrounding atrophy, persistent shallow SRF -- slightly increased, no frank heme Flat, Blunted foveal reflex, fine drusen, RPE mottling and clumping, No heme or edema   Vessels attenuated, Tortuous mild attenuation, mild tortuosity   Periphery Attached, reticular degeneration, focal pigment clumping IT, No heme Attached, reticular degeneration, focal pigment clumping IT, No heme           IMAGING AND PROCEDURES  Imaging and Procedures for 11/26/2023  OCT, Retina - OU - Both Eyes       Right Eye Quality was good. Central Foveal Thickness: 423. Progression has worsened. Findings include no IRF, no SRF, abnormal foveal contour, subretinal hyper-reflective material, pigment epithelial detachment, vitreomacular adhesion (Stable improvement in IRF, persistent central PED and SRF--SRF slightly increased, partial PVD).   Left Eye Quality was good. Central Foveal Thickness: 269. Progression has been stable. Findings include normal foveal contour, no IRF, no SRF, retinal drusen , epiretinal membrane, macular pucker (Very mild drusen, mild ERM with early pucker SN mac).   Notes *Images captured and stored on drive  Diagnosis / Impression:  OD: exu ARMD - Stable improvement in IRF, persistent central PED and SRF--SRF slightly increased, partial PVD OS: non-exu ARMD - Very mild drusen, mild ERM with early pucker SN mac  Clinical management:  See below  Abbreviations: NFP - Normal foveal profile. CME - cystoid macular edema. PED - pigment epithelial detachment. IRF - intraretinal fluid. SRF - subretinal fluid. EZ - ellipsoid zone. ERM - epiretinal membrane. ORA - outer retinal  atrophy. ORT - outer retinal tubulation. SRHM - subretinal hyper-reflective material. IRHM - intraretinal hyper-reflective material      Intravitreal Injection, Pharmacologic Agent - OD - Right Eye       Time Out 11/26/2023. 10:29 AM. Confirmed correct patient, procedure, site, and patient consented.   Anesthesia Topical anesthesia was used. Anesthetic medications included Lidocaine 2%, Proparacaine 0.5%.   Procedure Preparation included 5% betadine to ocular surface, eyelid speculum. A (32g) needle was used.   Injection: 6 mg faricimab -svoa 6 MG/0.05ML (Patient supplied)   Route: Intravitreal, Site: Right Eye   NDC: 49757-903-98, Lot: [id number], Expiration date: 01/11/2026, Waste: 0 mL   Post-op Post injection exam found visual acuity of at least counting fingers. The patient tolerated the procedure well. There were no complications. The patient received written and verbal post procedure care education. Post injection medications were not given.   Notes **SAMPLE MEDICATION ADMINISTERED**            ASSESSMENT/PLAN:    ICD-10-CM   1. Exudative age-related macular degeneration of right eye with active choroidal neovascularization (HCC)  H35.3211 OCT, Retina - OU - Both Eyes    Intravitreal Injection, Pharmacologic Agent - OD - Right Eye    faricimab -svoa (VABYSMO ) 6mg /0.57mL intravitreal injection    2. 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 Epiretinal membrane (ERM) of left eye  H35.372     4. Essential hypertension  I10     5. Hypertensive retinopathy of both eyes  H35.033     6. Pseudophakia of both eyes  Z96.1       1. 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, right eye - pt presented initially on 3.16.23 with 2 wk history of central vision loss and distortion  - s/p IVA OD #1 (03.16.23), #2 (04.13.23), #3 (05.11.23), #4 (  06.08.23) -- IVA resistance ============================== - s/p IVE OD #1  (07.06.23), #2 (08.03.23), #3 (08.31.23), #4 (10.05.23) -- IVE resistance =============================== - s/p IVV OD #1 (11.09.23), #2 (12.07.23), #3 (01.04.24), #4 (02.01.24), #5 (02.29.24), #6 (03.15.24), #7 (04.25.24), #8 (05.30.24), #9 (06.27.24), #10 (07.25.24), #11 (08.22.24), #12 (09.19.24), #13 (10.17.24), #14 (11.14.24), #15 (12.12.24), #16 (01.09.25), #17 (02.06.25 -- sample), #18 (06.05.25 -- sample] ================================ - s/p IVE HD OD #1 (03.13.25), #2 (04.10.25), #3 (sample -- 05.08.25), #4 (sample -- 07.10.25) **history of increased IRF at 5 weeks, noted on 07.11.25, 03.13.25 and 05.30.24 exams** - BCVA OD stable at 20/40  - OCT shows Stable improvement in IRF, persistent central PED and SRF--SRF slightly increased, partial PVD at 4.9 weeks - recommend IVV OD #19 [sample] today, 08.14.25 w/ f/u back to 4 wks -- [employer] funding unavailable **currently alternating IVE HD and IVV OD** - pt wishes to proceed with injection - RBA of procedure discussed, questions answered - IVV informed consent obtained and signed, 02.06.25 (OD) - IVE HD informed consent obtained and signed, 03.13.25 (OD) - see procedure note  - f/u 4 weeks, DFE, OCT, possible injection  2. Age related macular degeneration, non-exudative, left eye  - mild/intermediate stage with mild drusen - The incidence, anatomy, and pathology of dry AMD, risk of progression, and the AREDS and AREDS 2 study including smoking risks discussed with patient.   - recommend Amsler grid monitoring  3. Epiretinal membrane, left eye  - mild ERM OS w/ early pucker, SN macula - BCVA 20/30 -- decreased - asymptomatic, no metamorphopsia - no indication for surgery at this time - monitor  4,5. Hypertensive retinopathy OU - discussed importance of tight BP control - monitor  6. Pseudophakia OU  - s/p CE/IOL OU (Dr. Meridee - OS 01.14.25, OD 04.01.25)  - toric IOLs in good position  - monitor  Ophthalmic Meds  Ordered this visit:  Meds ordered this encounter  Medications   faricimab -svoa (VABYSMO ) 6mg /0.52mL intravitreal injection     Return in about 4 weeks (around 12/24/2023) for ex ARMD OD, Dilated Exam, OCT, Possible Injxn.  There are no Patient Instructions on file for this visit.   This document serves as a record of services personally performed by Redell JUDITHANN Hans, MD, PhD. It was created on their behalf by Avelina Pereyra, COA an ophthalmic technician. The creation of this record is the provider's dictation and/or activities during the visit.   Electronically signed by: Avelina Noble Pereyra, COT  11/26/23  11:26 AM   This document serves as a record of services personally performed by Redell JUDITHANN Hans, MD, PhD. It was created on their behalf by Almetta Pesa, an ophthalmic technician. The creation of this record is the provider's dictation and/or activities during the visit.    Electronically signed by: Almetta Pesa, OA, 11/26/23  11:26 AM   Redell JUDITHANN Hans, M.D., Ph.D. Diseases & Surgery of the Retina and Vitreous [facility]  I have reviewed the above documentation for accuracy and completeness, and I agree with the above. Redell JUDITHANN Hans, M.D., Ph.D. 11/26/23 11:27 AM   Abbreviations: M myopia (nearsighted); A astigmatism; H hyperopia (farsighted); P presbyopia; Mrx spectacle prescription;  CTL contact lenses; OD right eye; OS left eye; OU both eyes  XT exotropia; ET esotropia; PEK punctate epithelial keratitis; PEE punctate epithelial erosions; DES dry eye syndrome; MGD meibomian gland dysfunction; ATs artificial tears; PFAT's preservative free artificial tears; NSC nuclear sclerotic cataract; PSC posterior subcapsular cataract; ERM  epi-retinal membrane; PVD posterior vitreous detachment; RD retinal detachment; DM diabetes mellitus; DR diabetic retinopathy; NPDR non-proliferative diabetic retinopathy; PDR proliferative diabetic retinopathy; CSME clinically  significant macular edema; DME diabetic macular edema; dbh dot blot hemorrhages; CWS cotton wool spot; POAG primary open angle glaucoma; C/D cup-to-disc ratio; HVF humphrey visual field; GVF goldmann visual field; OCT optical coherence tomography; IOP intraocular pressure; BRVO Branch retinal vein occlusion; CRVO central retinal vein occlusion; CRAO central retinal artery occlusion; BRAO branch retinal artery occlusion; RT retinal tear; SB scleral buckle; PPV pars plana vitrectomy; VH Vitreous hemorrhage; PRP panretinal laser photocoagulation; IVK intravitreal kenalog; VMT vitreomacular traction; MH Macular hole;  NVD neovascularization of the disc; NVE neovascularization elsewhere; AREDS age related eye disease study; ARMD age related macular degeneration; POAG primary open angle glaucoma; EBMD epithelial/anterior basement membrane dystrophy; ACIOL anterior chamber intraocular lens; IOL intraocular lens; PCIOL posterior chamber intraocular lens; Phaco/IOL phacoemulsification with intraocular lens placement; PRK photorefractive keratectomy; LASIK laser assisted in situ keratomileusis; HTN hypertension; DM diabetes mellitus; COPD chronic obstructive pulmonary disease

## 2023-11-26 ENCOUNTER — Encounter (INDEPENDENT_AMBULATORY_CARE_PROVIDER_SITE_OTHER): Payer: Self-pay | Admitting: Ophthalmology

## 2023-11-26 ENCOUNTER — Ambulatory Visit (INDEPENDENT_AMBULATORY_CARE_PROVIDER_SITE_OTHER): Admitting: Ophthalmology

## 2023-11-26 DIAGNOSIS — H353122 Nonexudative age-related macular degeneration, left eye, intermediate dry stage: Secondary | ICD-10-CM | POA: Diagnosis not present

## 2023-11-26 DIAGNOSIS — H35033 Hypertensive retinopathy, bilateral: Secondary | ICD-10-CM

## 2023-11-26 DIAGNOSIS — H353211 Exudative age-related macular degeneration, right eye, with active choroidal neovascularization: Secondary | ICD-10-CM | POA: Diagnosis not present

## 2023-11-26 DIAGNOSIS — H35372 Puckering of macula, left eye: Secondary | ICD-10-CM | POA: Diagnosis not present

## 2023-11-26 DIAGNOSIS — Z961 Presence of intraocular lens: Secondary | ICD-10-CM

## 2023-11-26 DIAGNOSIS — I10 Essential (primary) hypertension: Secondary | ICD-10-CM

## 2023-11-26 MED ORDER — FARICIMAB-SVOA 6 MG/0.05ML IZ SOLN
6.0000 mg | INTRAVITREAL | Status: AC | PRN
  Administered 2023-11-26: 6 mg via INTRAVITREAL

## 2023-12-23 NOTE — Progress Notes (Signed)
 [facility] - Clinic Note  12/24/2023    CHIEF COMPLAINT Patient presents for Retina Follow Up  HISTORY OF PRESENT ILLNESS: Kimberly Noble is a 78 y.o. female who presents to the clinic today for:   HPI     Retina Follow Up   Patient presents with  Wet AMD.  In both eyes.  This started 4 weeks ago.  Duration of 4 weeks.  Since onset it is stable.  I, the attending physician,  performed the HPI with the patient and updated documentation appropriately.        Comments   4 week retina follow up ARMD pt is reporting no vision changes noticed she denies any flashes or floaters pt saw Dr [name] on Monday she is having YAG OS next month       Last edited by [name], MD on 12/24/2023  2:28 PM.    Pt states she saw Dr. [name]. She's confused about her astigmatism-they said she had 20/20 VA OU.   Referring physician: No referring provider defined for this encounter.  HISTORICAL INFORMATION:   Selected notes from the MEDICAL RECORD NUMBER Referred by Dr. [name] for PED OD LEE:  Ocular Hx- PMH-    CURRENT MEDICATIONS: No current outpatient medications on file. (Ophthalmic Drugs)   No current facility-administered medications for this visit. (Ophthalmic Drugs)   Current Outpatient Medications (Other)  Medication Sig   ALPRAZolam  (XANAX ) 1 MG tablet Take 1 mg by mouth 3 (three) times daily. 7am, 1pm, 7pm   Ascorbic Acid (VITAMIN C PO) Take 1 capsule by mouth daily. supplement from [facility]   Biotin w/ Vitamins C & E (HAIR/SKIN/NAILS PO) Take 1 capsule by mouth daily. supplement from [facility]   CALCIUM CARBONATE-VITAMIN D PO Take 1 capsule by mouth daily. supplement from [facility]   Cholecalciferol (VITAMIN D3 PO) Take 1 capsule by mouth daily. supplement from [facility]   Coenzyme Q10 (COQ10 PO) Take 1 capsule by mouth daily.   CRANBERRY PO Take 1 capsule by mouth daily. supplement from [facility]   Multiple Vitamin (MULTIVITAMIN WITH MINERALS) TABS tablet Take 1  tablet by mouth daily. supplement from [facility]   Omega-3 Fatty Acids (FISH OIL PO) Take 1 capsule by mouth daily. supplement from [facility]   OVER THE COUNTER MEDICATION Take 1 capsule by mouth daily. Joint support supplement from [facility]   OVER THE COUNTER MEDICATION Take 1 capsule by mouth daily. Cholesterol care supplement from [facility]   OVER THE COUNTER MEDICATION Take 1 capsule by mouth daily. Circulation and vein support supplement from [facility]   OVER THE COUNTER MEDICATION Take 1 capsule by mouth daily. Green vegetable supplement from [employer]   OVER THE COUNTER MEDICATION Take 1 capsule by mouth daily. Liver and brain supplement from [facility]   OVER THE COUNTER MEDICATION Take 1 capsule by mouth daily. Eye vitamin with lutein - supplement from [facility]   VITAMIN K PO Take 1 capsule by mouth daily. supplement from [facility]   No current facility-administered medications for this visit. (Other)   REVIEW OF SYSTEMS: ROS   Positive for: Neurological, Eyes Negative for: Constitutional, Gastrointestinal, Skin, Genitourinary, Musculoskeletal, HENT, Endocrine, Cardiovascular, Respiratory, Psychiatric, Allergic/Imm, Heme/Lymph Last edited by Resa Delon ORN, COT on 12/24/2023  9:00 AM.     ALLERGIES Allergies  Allergen Reactions   Crab Extract Anaphylaxis    Eating crab causes severe allergy. Claims she is not allergic  to all shellfish.    Penicillins Rash    Has patient had a PCN reaction causing immediate rash, facial/tongue/throat swelling, SOB or lightheadedness with hypotension: Yes Has patient had a PCN reaction causing severe rash involving mucus membranes or skin necrosis: No Has patient had a PCN reaction that required hospitalization No Has patient had a PCN reaction occurring within the last 10 years: No If all of the above answers are NO, then may proceed with Cephalosporin use.    PAST MEDICAL HISTORY Past Medical History:  Diagnosis Date   Agoraphobia with  panic attacks    Anxiety attack    Major depression    Superficial laceration of hand ~ 03/2015   left thumb   Past Surgical History:  Procedure Laterality Date   TONSILLECTOMY  1950's   FAMILY HISTORY Family History  Problem Relation Age of Onset   Macular degeneration Mother    SOCIAL HISTORY Social History   Tobacco Use   Smoking status: Every Day    Current packs/day: 0.33    Average packs/day: 0.3 packs/day for 40.0 years (13.2 ttl pk-yrs)    Types: Cigarettes   Smokeless tobacco: Never  Vaping Use   Vaping status: Never Used  Substance Use Topics   Alcohol use: No   Drug use: No       OPHTHALMIC EXAM:  Base Eye Exam     Visual Acuity (Snellen - Linear)       Right Left   Dist [place] 20/40 20/40 -2   Dist ph  20/30 20/30 -2         Tonometry (Tonopen, 9:07 AM)       Right Left   Pressure 16 18         Pupils       Pupils Dark Light Shape React APD   Right PERRL 3 2 Round Brisk None   Left PERRL 3 2 Round Brisk None         Visual Fields       Left Right    Full Full         Extraocular Movement       Right Left    Full, Ortho Full, Ortho         Neuro/Psych     Oriented x3: Yes   Mood/Affect: Normal         Dilation     Both eyes: 2.5% Phenylephrine @ 9:07 AM           Slit Lamp and Fundus Exam     Slit Lamp Exam       Right Left   Lids/Lashes Dermatochalasis - upper lid, mild MGD Dermatochalasis - upper lid, mild MGD   Conjunctiva/Sclera White and quiet White and quiet   Cornea mild arcus, trace PEE, trace tear film debris, well healed cataract wound with mild edema mild arcus, trace PEE, trace tear film debris   Anterior Chamber Deep, 0.5+ cell / pigment deep and clear   Iris Round and dilated Round and dilated   Lens Toric PC IOL in good position with marks at 0545 and 1145, 1+PCO toric PC IOL in good position with marks at 0115 and 0715, 1-2+ PCO   Anterior Vitreous Mild syneresis Mild syneresis,  Posterior vitreous detachment, vitreous condensations         Fundus Exam       Right Left   Disc Pink and Sharp, temporal PPA, Compact Pink and Sharp, temporal PPA, Compact   C/D  Ratio 0.5 0.5   Macula Blunted foveal reflex, +CNV with pigmented RPE rip and surrounding atrophy, persistent shallow SRF -- slightly improved, no frank heme Flat, Blunted foveal reflex, fine drusen, RPE mottling and clumping, No heme or edema   Vessels attenuated, Tortuous mild attenuation, mild tortuosity   Periphery Attached, reticular degeneration, focal pigment clumping IT, No heme Attached, reticular degeneration, focal pigment clumping IT, No heme           IMAGING AND PROCEDURES  Imaging and Procedures for 12/24/2023  OCT, Retina - OU - Both Eyes       Right Eye Quality was good. Central Foveal Thickness: 405. Progression has improved. Findings include no IRF, no SRF, abnormal foveal contour, subretinal hyper-reflective material, pigment epithelial detachment, vitreomacular adhesion (Stable improvement in IRF, persistent central PED and SRF--SRF slightly improved, partial PVD).   Left Eye Quality was good. Central Foveal Thickness: 273. Progression has been stable. Findings include normal foveal contour, no IRF, no SRF, retinal drusen , epiretinal membrane, macular pucker (Very mild drusen, mild ERM with early pucker SN mac).   Notes *Images captured and stored on drive  Diagnosis / Impression:  OD: exu ARMD - Stable improvement in IRF, persistent central PED and SRF--SRF slightly improved, partial PVD OS: non-exu ARMD - Very mild drusen, mild ERM with early pucker SN mac  Clinical management:  See below  Abbreviations: NFP - Normal foveal profile. CME - cystoid macular edema. PED - pigment epithelial detachment. IRF - intraretinal fluid. SRF - subretinal fluid. EZ - ellipsoid zone. ERM - epiretinal membrane. ORA - outer retinal atrophy. ORT - outer retinal tubulation. SRHM - subretinal  hyper-reflective material. IRHM - intraretinal hyper-reflective material      Intravitreal Injection, Pharmacologic Agent - OD - Right Eye       Time Out 12/24/2023. 9:41 AM. Confirmed correct patient, procedure, site, and patient consented.   Anesthesia Topical anesthesia was used. Anesthetic medications included Lidocaine 2%, Proparacaine 0.5%.   Procedure Preparation included 5% betadine to ocular surface, eyelid speculum. A (32g) needle was used.   Injection: 6 mg faricimab -svoa 6 MG/0.05ML (Patient supplied)   Route: Intravitreal, Site: Right Eye   NDC: 49757-903-98, Lot: [id number], Expiration date: 01/11/2026, Waste: 0 mL   Post-op Post injection exam found visual acuity of at least counting fingers. The patient tolerated the procedure well. There were no complications. The patient received written and verbal post procedure care education. Post injection medications were not given.   Notes **SAMPLE MEDICATION ADMINISTERED**           ASSESSMENT/PLAN:    ICD-10-CM   1. Exudative age-related macular degeneration of right eye with active choroidal neovascularization (HCC)  H35.3211 OCT, Retina - OU - Both Eyes    Intravitreal Injection, Pharmacologic Agent - OD - Right Eye    faricimab -svoa (VABYSMO ) 6mg /0.16mL intravitreal injection    2. 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 Epiretinal membrane (ERM) of left eye  H35.372     4. Essential hypertension  I10     5. Hypertensive retinopathy of both eyes  H35.033     6. Pseudophakia of both eyes  Z96.1      1. 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, right eye - pt presented initially on 3.16.23 with 2 wk history of central vision loss and distortion  - s/p IVA OD #1 (03.16.23), #2 (04.13.23), #3 (05.11.23), #4 (06.08.23) -- IVA resistance ============================== - s/p IVE OD #1 (  07.06.23), #2 (08.03.23), #3 (08.31.23), #4 (10.05.23) -- IVE  resistance =============================== - s/p IVV OD #1 (11.09.23), #2 (12.07.23), #3 (01.04.24), #4 (02.01.24), #5 (02.29.24), #6 (03.15.24), #7 (04.25.24), #8 (05.30.24), #9 (06.27.24), #10 (07.25.24), #11 (08.22.24), #12 (09.19.24), #13 (10.17.24), #14 (11.14.24), #15 (12.12.24), #16 (01.09.25), #17 (02.06.25 -- sample), #18 (06.05.25 -- sample], #19 (08.14.25--sample) ================================ - s/p IVE HD OD #1 (03.13.25), #2 (04.10.25), #3 (sample -- 05.08.25), #4 (sample -- 07.10.25) **history of increased IRF at 5 weeks, noted on 07.11.25, 03.13.25 and 05.30.24 exams** - BCVA OD stable at 20/30  - OCT shows Stable improvement in IRF, persistent central PED and SRF--SRF slightly improved, partial PVD at 4 weeks - recommend IVV OD #19 [sample] today, 09.11.25 w/ f/u at 4 wks -- [employer] funding unavailable **previously alternating IVE HD and IVV OD, but IVV seems to be working slightly better** - pt wishes to proceed with injection - RBA of procedure discussed, questions answered - IVV informed consent obtained and signed, 02.06.25 (OD) - IVE HD informed consent obtained and signed, 03.13.25 (OD) - see procedure note  - f/u 4 weeks, DFE, OCT, possible injection  2. Age related macular degeneration, non-exudative, left eye  - mild/intermediate stage with mild drusen - The incidence, anatomy, and pathology of dry AMD, risk of progression, and the AREDS and AREDS 2 study including smoking risks discussed with patient.   - recommend Amsler grid monitoring  3. Epiretinal membrane, left eye  - mild ERM OS w/ early pucker, SN macula - BCVA 20/30 -- decreased - asymptomatic, no metamorphopsia - no indication for surgery at this time - monitor  4,5. Hypertensive retinopathy OU - discussed importance of tight BP control - monitor  6. Pseudophakia OU  - s/p CE/IOL OU (Dr. Meridee - OS 01.14.25, OD 04.01.25)  - toric IOLs in good position  - monitor  Ophthalmic Meds  Ordered this visit:  Meds ordered this encounter  Medications   faricimab -svoa (VABYSMO ) 6mg /0.36mL intravitreal injection     Return in about 4 weeks (around 01/21/2024) for ex ARMD OD, Dilated Exam, OCT, Possible Injxn.  There are no Patient Instructions on file for this visit.   This document serves as a record of services personally performed by Redell JUDITHANN Hans, MD, PhD. It was created on their behalf by Auston Muzzy, COMT. The creation of this record is the provider's dictation and/or activities during the visit.  Electronically signed by: Auston Muzzy, COMT 12/24/23 2:30 PM  This document serves as a record of services personally performed by Redell JUDITHANN Hans, MD, PhD. It was created on their behalf by Almetta Pesa, an ophthalmic technician. The creation of this record is the provider's dictation and/or activities during the visit.    Electronically signed by: Almetta Pesa, OA, 12/24/23  2:30 PM   Redell JUDITHANN Hans, M.D., Ph.D. Diseases & Surgery of the Retina and Vitreous [facility]  I have reviewed the above documentation for accuracy and completeness, and I agree with the above. Redell JUDITHANN Hans, M.D., Ph.D. 12/24/23 2:32 PM   Abbreviations: M myopia (nearsighted); A astigmatism; H hyperopia (farsighted); P presbyopia; Mrx spectacle prescription;  CTL contact lenses; OD right eye; OS left eye; OU both eyes  XT exotropia; ET esotropia; PEK punctate epithelial keratitis; PEE punctate epithelial erosions; DES dry eye syndrome; MGD meibomian gland dysfunction; ATs artificial tears; PFAT's preservative free artificial tears; NSC nuclear sclerotic cataract; PSC posterior subcapsular cataract; ERM epi-retinal membrane; PVD posterior vitreous detachment; RD retinal detachment;  DM diabetes mellitus; DR diabetic retinopathy; NPDR non-proliferative diabetic retinopathy; PDR proliferative diabetic retinopathy; CSME clinically significant macular edema; DME diabetic  macular edema; dbh dot blot hemorrhages; CWS cotton wool spot; POAG primary open angle glaucoma; C/D cup-to-disc ratio; HVF humphrey visual field; GVF goldmann visual field; OCT optical coherence tomography; IOP intraocular pressure; BRVO Branch retinal vein occlusion; CRVO central retinal vein occlusion; CRAO central retinal artery occlusion; BRAO branch retinal artery occlusion; RT retinal tear; SB scleral buckle; PPV pars plana vitrectomy; VH Vitreous hemorrhage; PRP panretinal laser photocoagulation; IVK intravitreal kenalog; VMT vitreomacular traction; MH Macular hole;  NVD neovascularization of the disc; NVE neovascularization elsewhere; AREDS age related eye disease study; ARMD age related macular degeneration; POAG primary open angle glaucoma; EBMD epithelial/anterior basement membrane dystrophy; ACIOL anterior chamber intraocular lens; IOL intraocular lens; PCIOL posterior chamber intraocular lens; Phaco/IOL phacoemulsification with intraocular lens placement; PRK photorefractive keratectomy; LASIK laser assisted in situ keratomileusis; HTN hypertension; DM diabetes mellitus; COPD chronic obstructive pulmonary disease

## 2023-12-24 ENCOUNTER — Encounter (INDEPENDENT_AMBULATORY_CARE_PROVIDER_SITE_OTHER): Payer: Self-pay | Admitting: Ophthalmology

## 2023-12-24 ENCOUNTER — Ambulatory Visit (INDEPENDENT_AMBULATORY_CARE_PROVIDER_SITE_OTHER): Admitting: Ophthalmology

## 2023-12-24 DIAGNOSIS — H353122 Nonexudative age-related macular degeneration, left eye, intermediate dry stage: Secondary | ICD-10-CM | POA: Diagnosis not present

## 2023-12-24 DIAGNOSIS — I10 Essential (primary) hypertension: Secondary | ICD-10-CM | POA: Diagnosis not present

## 2023-12-24 DIAGNOSIS — H353211 Exudative age-related macular degeneration, right eye, with active choroidal neovascularization: Secondary | ICD-10-CM

## 2023-12-24 DIAGNOSIS — H35372 Puckering of macula, left eye: Secondary | ICD-10-CM

## 2023-12-24 DIAGNOSIS — Z961 Presence of intraocular lens: Secondary | ICD-10-CM

## 2023-12-24 DIAGNOSIS — H35033 Hypertensive retinopathy, bilateral: Secondary | ICD-10-CM

## 2023-12-24 MED ORDER — FARICIMAB-SVOA 6 MG/0.05ML IZ SOLN
6.0000 mg | INTRAVITREAL | Status: AC | PRN
  Administered 2023-12-24: 6 mg via INTRAVITREAL

## 2024-01-19 NOTE — Progress Notes (Signed)
 [facility] - Clinic Note  01/21/2024    CHIEF COMPLAINT Patient presents for Retina Follow Up  HISTORY OF PRESENT ILLNESS: Kimberly Noble is a 78 y.o. female who presents to the clinic today for:   HPI     Retina Follow Up   Patient presents with  Wet AMD.  In both eyes.  This started 4 weeks ago.  Duration of 4 weeks.  Since onset it is stable.  I, the attending physician,  performed the HPI with the patient and updated documentation appropriately.        Comments   4 week retina follow up  ARMD OD IVV OD pt is reporting no vision changes noticed she had yag OS [name] and can see better       Last edited by [name], MD on 01/21/2024  4:08 PM.     Referring physician: No referring provider defined for this encounter.  HISTORICAL INFORMATION:   Selected notes from the MEDICAL RECORD NUMBER Referred by Dr. [name] for PED OD LEE:  Ocular Hx- PMH-    CURRENT MEDICATIONS: No current outpatient medications on file. (Ophthalmic Drugs)   No current facility-administered medications for this visit. (Ophthalmic Drugs)   Current Outpatient Medications (Other)  Medication Sig   ALPRAZolam  (XANAX ) 1 MG tablet Take 1 mg by mouth 3 (three) times daily. 7am, 1pm, 7pm   Ascorbic Acid (VITAMIN C PO) Take 1 capsule by mouth daily. supplement from [facility]   Biotin w/ Vitamins C & E (HAIR/SKIN/NAILS PO) Take 1 capsule by mouth daily. supplement from [facility]   CALCIUM CARBONATE-VITAMIN D PO Take 1 capsule by mouth daily. supplement from [facility]   Cholecalciferol (VITAMIN D3 PO) Take 1 capsule by mouth daily. supplement from [facility]   Coenzyme Q10 (COQ10 PO) Take 1 capsule by mouth daily.   CRANBERRY PO Take 1 capsule by mouth daily. supplement from [facility]   Multiple Vitamin (MULTIVITAMIN WITH MINERALS) TABS tablet Take 1 tablet by mouth daily. supplement from [facility]   Omega-3 Fatty Acids (FISH OIL PO) Take 1 capsule by mouth daily. supplement  from [facility]   OVER THE COUNTER MEDICATION Take 1 capsule by mouth daily. Joint support supplement from [facility]   OVER THE COUNTER MEDICATION Take 1 capsule by mouth daily. Cholesterol care supplement from [facility]   OVER THE COUNTER MEDICATION Take 1 capsule by mouth daily. Circulation and vein support supplement from [facility]   OVER THE COUNTER MEDICATION Take 1 capsule by mouth daily. Green vegetable supplement from [employer]   OVER THE COUNTER MEDICATION Take 1 capsule by mouth daily. Liver and brain supplement from [facility]   OVER THE COUNTER MEDICATION Take 1 capsule by mouth daily. Eye vitamin with lutein - supplement from [facility]   VITAMIN K PO Take 1 capsule by mouth daily. supplement from [facility]   No current facility-administered medications for this visit. (Other)   REVIEW OF SYSTEMS: ROS   Positive for: Neurological, Eyes Negative for: Constitutional, Gastrointestinal, Skin, Genitourinary, Musculoskeletal, HENT, Endocrine, Cardiovascular, Respiratory, Psychiatric, Allergic/Imm, Heme/Lymph Last edited by Resa Delon ORN, COT on 01/21/2024  9:02 AM.      ALLERGIES Allergies  Allergen Reactions   Crab Extract Anaphylaxis    Eating crab causes severe allergy. Claims she is not allergic to all shellfish.    Penicillins Rash    Has patient had a PCN reaction causing immediate rash, facial/tongue/throat swelling,  SOB or lightheadedness with hypotension: Yes Has patient had a PCN reaction causing severe rash involving mucus membranes or skin necrosis: No Has patient had a PCN reaction that required hospitalization No Has patient had a PCN reaction occurring within the last 10 years: No If all of the above answers are NO, then may proceed with Cephalosporin use.    PAST MEDICAL HISTORY Past Medical History:  Diagnosis Date   Agoraphobia with panic attacks    Anxiety attack    Major depression    Superficial laceration of hand ~ 03/2015   left thumb   Past  Surgical History:  Procedure Laterality Date   TONSILLECTOMY  1950's   FAMILY HISTORY Family History  Problem Relation Age of Onset   Macular degeneration Mother    SOCIAL HISTORY Social History   Tobacco Use   Smoking status: Every Day    Current packs/day: 0.33    Average packs/day: 0.3 packs/day for 40.0 years (13.2 ttl pk-yrs)    Types: Cigarettes   Smokeless tobacco: Never  Vaping Use   Vaping status: Never Used  Substance Use Topics   Alcohol use: No   Drug use: No       OPHTHALMIC EXAM:  Base Eye Exam     Visual Acuity (Snellen - Linear)       Right Left   Dist [place] 20/40 20/30 -1   Dist ph [place] 20/25 -1 20/25         Tonometry (Tonopen, 9:08 AM)       Right Left   Pressure 15 17         Pupils       Pupils Dark Light Shape React APD   Right PERRL 3 2 Round Brisk None   Left PERRL 3 2 Round Brisk None         Visual Fields       Left Right    Full Full         Extraocular Movement       Right Left    Full, Ortho Full, Ortho         Neuro/Psych     Oriented x3: Yes   Mood/Affect: Normal         Dilation     Both eyes: 2.5% Phenylephrine @ 9:09 AM           Slit Lamp and Fundus Exam     Slit Lamp Exam       Right Left   Lids/Lashes Dermatochalasis - upper lid, mild MGD Dermatochalasis - upper lid, mild MGD   Conjunctiva/Sclera White and quiet White and quiet   Cornea mild arcus, trace tear film debris, well healed cataract wound with mild edema mild arcus, trace PEE, trace tear film debris, Well healed temporal cataract wound   Anterior Chamber Deep, 0.5+ cell / pigment deep and clear   Iris Round and dilated Round and dilated   Lens Toric PC IOL in good position with marks at 0545 and 1145, 1+PCO toric PC IOL in good position with marks at 0115 and 0715, open PC   Anterior Vitreous Mild syneresis Mild syneresis, Posterior vitreous detachment, vitreous condensations         Fundus Exam       Right Left   Disc  Pink and Sharp, temporal PPA, Compact Pink and Sharp, temporal PPA, Compact   C/D Ratio 0.5 0.5   Macula Blunted foveal reflex, +CNV with pigmented RPE rip and surrounding atrophy, persistent shallow  SRF -- slightly improved, no frank heme Flat, Blunted foveal reflex, fine drusen, RPE mottling and clumping, No heme or edema   Vessels attenuated, Tortuous mild attenuation, mild tortuosity   Periphery Attached, reticular degeneration, focal pigment clumping IT, No heme Attached, reticular degeneration, focal pigment clumping IT, No heme           IMAGING AND PROCEDURES  Imaging and Procedures for 01/21/2024  OCT, Retina - OU - Both Eyes       Right Eye Quality was good. Central Foveal Thickness: 399. Progression has improved. Findings include no IRF, no SRF, abnormal foveal contour, subretinal hyper-reflective material, pigment epithelial detachment, vitreomacular adhesion (Stable improvement in IRF, persistent central PED and SRF--SRF slightly improved, partial PVD).   Left Eye Quality was good. Central Foveal Thickness: 266. Progression has been stable. Findings include normal foveal contour, no IRF, no SRF, retinal drusen , epiretinal membrane, macular pucker (Very mild drusen, mild ERM with early pucker SN mac).   Notes *Images captured and stored on drive  Diagnosis / Impression:  OD: exu ARMD - Stable improvement in IRF, persistent central PED and SRF--SRF slightly improved, partial PVD OS: non-exu ARMD - Very mild drusen, mild ERM with early pucker SN mac  Clinical management:  See below  Abbreviations: NFP - Normal foveal profile. CME - cystoid macular edema. PED - pigment epithelial detachment. IRF - intraretinal fluid. SRF - subretinal fluid. EZ - ellipsoid zone. ERM - epiretinal membrane. ORA - outer retinal atrophy. ORT - outer retinal tubulation. SRHM - subretinal hyper-reflective material. IRHM - intraretinal hyper-reflective material      Intravitreal Injection,  Pharmacologic Agent - OD - Right Eye       Time Out 01/21/2024. 9:56 AM. Confirmed correct patient, procedure, site, and patient consented.   Anesthesia Topical anesthesia was used. Anesthetic medications included Lidocaine 2%, Proparacaine 0.5%.   Procedure Preparation included 5% betadine to ocular surface, eyelid speculum. A (32g) needle was used.   Injection: 6 mg faricimab -svoa 6 MG/0.05ML (Patient supplied)   Route: Intravitreal, Site: Right Eye   NDC: 49757-903-98, Lot: [id number], Expiration date: 01/11/2026, Waste: 0 mL   Post-op Post injection exam found visual acuity of at least counting fingers. The patient tolerated the procedure well. There were no complications. The patient received written and verbal post procedure care education. Post injection medications were not given.   Notes **SAMPLE MEDICATION ADMINISTERED**            ASSESSMENT/PLAN:    ICD-10-CM   1. Exudative age-related macular degeneration of right eye with active choroidal neovascularization (HCC)  H35.3211 OCT, Retina - OU - Both Eyes    Intravitreal Injection, Pharmacologic Agent - OD - Right Eye    faricimab -svoa (VABYSMO ) 6mg /0.68mL intravitreal injection    2. 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 Epiretinal membrane (ERM) of left eye  H35.372     4. Essential hypertension  I10     5. Hypertensive retinopathy of both eyes  H35.033     6. Pseudophakia of both eyes  Z96.1       1. 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, right eye - pt presented initially on 3.16.23 with 2 wk history of central vision loss and distortion  - s/p IVA OD #1 (03.16.23), #2 (04.13.23), #3 (05.11.23), #4 (06.08.23) -- IVA resistance ============================== - s/p IVE OD #1 (07.06.23), #2 (08.03.23), #3 (08.31.23), #4 (10.05.23) -- IVE resistance =============================== - s/p IVV OD #1 (11.09.23), #  2 (12.07.23), #3 (01.04.24), #4  (02.01.24), #5 (02.29.24), #6 (03.15.24), #7 (04.25.24), #8 (05.30.24), #9 (06.27.24), #10 (07.25.24), #11 (08.22.24), #12 (09.19.24), #13 (10.17.24), #14 (11.14.24), #15 (12.12.24), #16 (01.09.25), #17 (02.06.25 -- sample), #18 (06.05.25 -- sample], #19 (08.14.25--sample), #20 (09.11.25---sample) ================================ - s/p IVE HD OD #1 (03.13.25), #2 (04.10.25), #3 (sample -- 05.08.25), #4 (sample -- 07.10.25) **history of increased IRF at 5 weeks, noted on 07.11.25, 03.13.25 and 05.30.24 exams** - BCVA OD stable at 20/30  - OCT shows Stable improvement in IRF, persistent central PED and SRF--SRF slightly improved, partial PVD at 4 weeks - recommend IVV OD #21 [sample] today, 10.09.25 w/ f/u at 4 wks -- [employer] funding unavailable **previously alternating IVE HD and IVV OD, but will stick w/ IVV in future** - pt wishes to proceed with injection - RBA of procedure discussed, questions answered - IVV informed consent obtained and signed, 02.06.25 (OD) - IVE HD informed consent obtained and signed, 03.13.25 (OD) - see procedure note  - f/u 4 weeks, DFE, OCT, possible injection  2. Age related macular degeneration, non-exudative, left eye  - mild/intermediate stage with mild drusen - The incidence, anatomy, and pathology of dry AMD, risk of progression, and the AREDS and AREDS 2 study including smoking risks discussed with patient.   - recommend Amsler grid monitoring  3. Epiretinal membrane, left eye  - mild ERM OS w/ early pucker, SN macula - BCVA 20/25 -- improved - asymptomatic, no metamorphopsia - no indication for surgery at this time - monitor  4,5. Hypertensive retinopathy OU - discussed importance of tight BP control - monitor  6. Pseudophakia OU  - s/p CE/IOL OU (Dr. Meridee - OS 01.14.25, OD 04.01.25)  - toric IOLs in good position  - s/p YAG OS w/ Dr. Meridee on 10.06.25  - monitor  Ophthalmic Meds Ordered this visit:  Meds ordered this encounter   Medications   faricimab -svoa (VABYSMO ) 6mg /0.11mL intravitreal injection     Return in about 4 weeks (around 02/18/2024) for exu ARMD OD, DFE, OCT, Possible Injxn.  There are no Patient Instructions on file for this visit.   This document serves as a record of services personally performed by Redell JUDITHANN Hans, MD, PhD. It was created on their behalf by Auston Muzzy, COMT. The creation of this record is the provider's dictation and/or activities during the visit.  Electronically signed by: Auston Muzzy, COMT 01/21/24 4:10 PM  This document serves as a record of services personally performed by Redell JUDITHANN Hans, MD, PhD. It was created on their behalf by Almetta Pesa, an ophthalmic technician. The creation of this record is the provider's dictation and/or activities during the visit.    Electronically signed by: Almetta Pesa, OA, 01/21/24  4:10 PM  Redell JUDITHANN Hans, M.D., Ph.D. Diseases & Surgery of the Retina and Vitreous [facility]  I have reviewed the above documentation for accuracy and completeness, and I agree with the above. Redell JUDITHANN Hans, M.D., Ph.D. 01/21/24 4:11 PM   Abbreviations: M myopia (nearsighted); A astigmatism; H hyperopia (farsighted); P presbyopia; Mrx spectacle prescription;  CTL contact lenses; OD right eye; OS left eye; OU both eyes  XT exotropia; ET esotropia; PEK punctate epithelial keratitis; PEE punctate epithelial erosions; DES dry eye syndrome; MGD meibomian gland dysfunction; ATs artificial tears; PFAT's preservative free artificial tears; NSC nuclear sclerotic cataract; PSC posterior subcapsular cataract; ERM epi-retinal membrane; PVD posterior vitreous detachment; RD retinal detachment; DM diabetes mellitus; DR diabetic retinopathy; NPDR non-proliferative  diabetic retinopathy; PDR proliferative diabetic retinopathy; CSME clinically significant macular edema; DME diabetic macular edema; dbh dot blot hemorrhages; CWS cotton wool  spot; POAG primary open angle glaucoma; C/D cup-to-disc ratio; HVF humphrey visual field; GVF goldmann visual field; OCT optical coherence tomography; IOP intraocular pressure; BRVO Branch retinal vein occlusion; CRVO central retinal vein occlusion; CRAO central retinal artery occlusion; BRAO branch retinal artery occlusion; RT retinal tear; SB scleral buckle; PPV pars plana vitrectomy; VH Vitreous hemorrhage; PRP panretinal laser photocoagulation; IVK intravitreal kenalog; VMT vitreomacular traction; MH Macular hole;  NVD neovascularization of the disc; NVE neovascularization elsewhere; AREDS age related eye disease study; ARMD age related macular degeneration; POAG primary open angle glaucoma; EBMD epithelial/anterior basement membrane dystrophy; ACIOL anterior chamber intraocular lens; IOL intraocular lens; PCIOL posterior chamber intraocular lens; Phaco/IOL phacoemulsification with intraocular lens placement; PRK photorefractive keratectomy; LASIK laser assisted in situ keratomileusis; HTN hypertension; DM diabetes mellitus; COPD chronic obstructive pulmonary disease

## 2024-01-21 ENCOUNTER — Ambulatory Visit (INDEPENDENT_AMBULATORY_CARE_PROVIDER_SITE_OTHER): Admitting: Ophthalmology

## 2024-01-21 ENCOUNTER — Encounter (INDEPENDENT_AMBULATORY_CARE_PROVIDER_SITE_OTHER): Payer: Self-pay | Admitting: Ophthalmology

## 2024-01-21 DIAGNOSIS — H35372 Puckering of macula, left eye: Secondary | ICD-10-CM

## 2024-01-21 DIAGNOSIS — H353211 Exudative age-related macular degeneration, right eye, with active choroidal neovascularization: Secondary | ICD-10-CM | POA: Diagnosis not present

## 2024-01-21 DIAGNOSIS — Z961 Presence of intraocular lens: Secondary | ICD-10-CM

## 2024-01-21 DIAGNOSIS — H35033 Hypertensive retinopathy, bilateral: Secondary | ICD-10-CM

## 2024-01-21 DIAGNOSIS — I10 Essential (primary) hypertension: Secondary | ICD-10-CM | POA: Diagnosis not present

## 2024-01-21 DIAGNOSIS — H353122 Nonexudative age-related macular degeneration, left eye, intermediate dry stage: Secondary | ICD-10-CM | POA: Diagnosis not present

## 2024-01-21 MED ORDER — FARICIMAB-SVOA 6 MG/0.05ML IZ SOLN
6.0000 mg | INTRAVITREAL | Status: AC | PRN
  Administered 2024-01-21: 6 mg via INTRAVITREAL

## 2024-02-17 NOTE — Progress Notes (Signed)
 [facility] - Clinic Note  02/18/2024    CHIEF COMPLAINT Patient presents for Retina Follow Up  HISTORY OF PRESENT ILLNESS: Kimberly Noble is a 78 y.o. female who presents to the clinic today for:   HPI     Retina Follow Up   Patient presents with  Wet AMD.  In right eye.  This started 2.5 years ago.  Severity is moderate.  Duration of 4 weeks.  Since onset it is stable.  I, the attending physician,  performed the HPI with the patient and updated documentation appropriately.        Comments   Pt denies any changes or concerns with vision.       Last edited by [name], MD on 02/18/2024 12:38 PM.    Patient feels the vision is the same.  She states that the eyes have like a film over them. She is using AT's.  Referring physician: No referring provider defined for this encounter.  HISTORICAL INFORMATION:   Selected notes from the MEDICAL RECORD NUMBER Referred by Dr. [name] for PED OD LEE:  Ocular Hx- PMH-    CURRENT MEDICATIONS: No current outpatient medications on file. (Ophthalmic Drugs)   No current facility-administered medications for this visit. (Ophthalmic Drugs)   Current Outpatient Medications (Other)  Medication Sig   ALPRAZolam  (XANAX ) 1 MG tablet Take 1 mg by mouth 3 (three) times daily. 7am, 1pm, 7pm   Ascorbic Acid (VITAMIN C PO) Take 1 capsule by mouth daily. supplement from [facility]   Biotin w/ Vitamins C & E (HAIR/SKIN/NAILS PO) Take 1 capsule by mouth daily. supplement from [facility]   CALCIUM CARBONATE-VITAMIN D PO Take 1 capsule by mouth daily. supplement from [facility]   Cholecalciferol (VITAMIN D3 PO) Take 1 capsule by mouth daily. supplement from [facility]   Coenzyme Q10 (COQ10 PO) Take 1 capsule by mouth daily.   CRANBERRY PO Take 1 capsule by mouth daily. supplement from [facility]   Multiple Vitamin (MULTIVITAMIN WITH MINERALS) TABS tablet Take 1 tablet by mouth daily. supplement from [facility]   Omega-3 Fatty Acids  (FISH OIL PO) Take 1 capsule by mouth daily. supplement from [facility]   OVER THE COUNTER MEDICATION Take 1 capsule by mouth daily. Joint support supplement from [facility]   OVER THE COUNTER MEDICATION Take 1 capsule by mouth daily. Cholesterol care supplement from [facility]   OVER THE COUNTER MEDICATION Take 1 capsule by mouth daily. Circulation and vein support supplement from [facility]   OVER THE COUNTER MEDICATION Take 1 capsule by mouth daily. Green vegetable supplement from [employer]   OVER THE COUNTER MEDICATION Take 1 capsule by mouth daily. Liver and brain supplement from [facility]   OVER THE COUNTER MEDICATION Take 1 capsule by mouth daily. Eye vitamin with lutein - supplement from [facility]   VITAMIN K PO Take 1 capsule by mouth daily. supplement from [facility]   No current facility-administered medications for this visit. (Other)   REVIEW OF SYSTEMS: ROS   Positive for: Neurological, Eyes Negative for: Constitutional, Gastrointestinal, Skin, Genitourinary, Musculoskeletal, HENT, Endocrine, Cardiovascular, Respiratory, Psychiatric, Allergic/Imm, Heme/Lymph Last edited by [name], COT on 02/18/2024  9:15 AM.     ALLERGIES Allergies  Allergen Reactions   Crab Extract Anaphylaxis    Eating crab causes severe allergy. Claims she is not allergic to all shellfish.    Penicillins Rash    Has patient had a PCN  reaction causing immediate rash, facial/tongue/throat swelling, SOB or lightheadedness with hypotension: Yes Has patient had a PCN reaction causing severe rash involving mucus membranes or skin necrosis: No Has patient had a PCN reaction that required hospitalization No Has patient had a PCN reaction occurring within the last 10 years: No If all of the above answers are NO, then may proceed with Cephalosporin use.    PAST MEDICAL HISTORY Past Medical History:  Diagnosis Date   Agoraphobia with panic attacks    Anxiety attack    Major depression    Superficial  laceration of hand ~ 03/2015   left thumb   Past Surgical History:  Procedure Laterality Date   TONSILLECTOMY  1950's   FAMILY HISTORY Family History  Problem Relation Age of Onset   Macular degeneration Mother    SOCIAL HISTORY Social History   Tobacco Use   Smoking status: Every Day    Current packs/day: 0.33    Average packs/day: 0.3 packs/day for 40.0 years (13.2 ttl pk-yrs)    Types: Cigarettes   Smokeless tobacco: Never  Vaping Use   Vaping status: Never Used  Substance Use Topics   Alcohol use: No   Drug use: No       OPHTHALMIC EXAM:  Base Eye Exam     Visual Acuity (Snellen - Linear)       Right Left   Dist [place] 20/40 20/30 -1   Dist ph [place] 20/30 +2 20/25 -2         Tonometry (Tonopen, 9:13 AM)       Right Left   Pressure 15 14         Pupils       Pupils Dark Light Shape React APD   Right PERRL 5 3 Round Brisk None   Left PERRL 5 3 Round Brisk None         Visual Fields       Left Right    Full Full         Extraocular Movement       Right Left    Full, Ortho Full, Ortho         Neuro/Psych     Oriented x3: Yes   Mood/Affect: Normal         Dilation     Both eyes: 1.0% Mydriacyl, 2.5% Phenylephrine @ 9:15 AM           Slit Lamp and Fundus Exam     Slit Lamp Exam       Right Left   Lids/Lashes Dermatochalasis - upper lid, mild MGD Dermatochalasis - upper lid, mild MGD   Conjunctiva/Sclera White and quiet White and quiet   Cornea mild arcus, trace tear film debris, well healed cataract wound with mild edema mild arcus, trace PEE, trace tear film debris, Well healed temporal cataract wound   Anterior Chamber Deep, 0.5+ cell / pigment deep and clear   Iris Round and dilated Round and dilated   Lens Toric PC IOL in good position with marks at 0545 and 1145, 1+PCO toric PC IOL in good position with marks at 0115 and 0715, open PC   Anterior Vitreous Mild syneresis Mild syneresis, Posterior vitreous detachment,  vitreous condensations         Fundus Exam       Right Left   Disc Pink and Sharp, temporal PPA, Compact Pink and Sharp, temporal PPA, Compact   C/D Ratio 0.5 0.5   Macula Blunted foveal reflex, +CNV  with pigmented RPE rip and surrounding atrophy, trace shallow SRF -- slightly improved, no frank heme Flat, Blunted foveal reflex, fine drusen, RPE mottling and clumping, No heme or edema   Vessels attenuated, Tortuous mild attenuation, mild tortuosity   Periphery Attached, reticular degeneration, focal pigment clumping IT, No heme Attached, reticular degeneration, focal pigment clumping IT, No heme           IMAGING AND PROCEDURES  Imaging and Procedures for 02/18/2024  OCT, Retina - OU - Both Eyes       Right Eye Quality was good. Central Foveal Thickness: 386. Progression has improved. Findings include no IRF, no SRF, abnormal foveal contour, subretinal hyper-reflective material, pigment epithelial detachment, vitreomacular adhesion (Stable improvement in IRF, persistent central PED and SRF--SRF slightly improved, partial PVD).   Left Eye Quality was good. Central Foveal Thickness: 266. Progression has been stable. Findings include normal foveal contour, no IRF, no SRF, retinal drusen , epiretinal membrane, macular pucker (Very mild drusen, mild ERM with early pucker SN mac).   Notes *Images captured and stored on drive  Diagnosis / Impression:  OD: exu ARMD - Stable improvement in IRF, persistent central PED; SRF slightly improved, partial PVD OS: non-exu ARMD - Very mild drusen, mild ERM with early pucker SN mac  Clinical management:  See below  Abbreviations: NFP - Normal foveal profile. CME - cystoid macular edema. PED - pigment epithelial detachment. IRF - intraretinal fluid. SRF - subretinal fluid. EZ - ellipsoid zone. ERM - epiretinal membrane. ORA - outer retinal atrophy. ORT - outer retinal tubulation. SRHM - subretinal hyper-reflective material. IRHM - intraretinal  hyper-reflective material      Intravitreal Injection, Pharmacologic Agent - OD - Right Eye       Time Out 02/18/2024. 9:20 AM. Confirmed correct patient, procedure, site, and patient consented.   Anesthesia Topical anesthesia was used. Anesthetic medications included Lidocaine 2%, Proparacaine 0.5%.   Procedure Preparation included 5% betadine to ocular surface, eyelid speculum. A supplied (32g) needle was used.   Injection: 6 mg faricimab -svoa 6 MG/0.05ML (Patient supplied)   Route: Intravitreal, Site: Right Eye   NDC: 49757-903-93, Lot: [id number], Expiration date: 01/11/2025, Waste: 0 mL   Post-op Post injection exam found visual acuity of at least counting fingers. The patient tolerated the procedure well. There were no complications. The patient received written and verbal post procedure care education. Post injection medications were not given.            ASSESSMENT/PLAN:    ICD-10-CM   1. Exudative age-related macular degeneration of right eye with active choroidal neovascularization (HCC)  H35.3211 OCT, Retina - OU - Both Eyes    Intravitreal Injection, Pharmacologic Agent - OD - Right Eye    faricimab -svoa (VABYSMO ) 6mg /0.24mL intravitreal injection    2. 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 Epiretinal membrane (ERM) of left eye  H35.372     4. Essential hypertension  I10     5. Hypertensive retinopathy of both eyes  H35.033     6. Pseudophakia of both eyes  Z96.1      1. 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, right eye - pt presented initially on 3.16.23 with 2 wk history of central vision loss and distortion  - s/p IVA OD #1 (03.16.23), #2 (04.13.23), #3 (05.11.23), #4 (06.08.23) -- IVA resistance ============================== - s/p IVE OD #1 (07.06.23), #2 (08.03.23), #3 (08.31.23), #4 (10.05.23) -- IVE resistance =============================== - s/p IVV OD #  1 (11.09.23), #2 (12.07.23), #3  (01.04.24), #4 (02.01.24), #5 (02.29.24), #6 (03.15.24), #7 (04.25.24), #8 (05.30.24), #9 (06.27.24), #10 (07.25.24), #11 (08.22.24), #12 (09.19.24), #13 (10.17.24), #14 (11.14.24), #15 (12.12.24), #16 (01.09.25), #17 (02.06.25 sample), #18 (06.05.25 sample], #19 (08.14.25 sample), #20 (09.11.25 sample), #21 (10.09.25 sample) ================================ - s/p IVE HD OD #1 (03.13.25), #2 (04.10.25), #3 (sample 05.08.25), #4 (sample 07.10.25) **history of increased IRF at 5 weeks, noted on 07.11.25, 03.13.25 and 05.30.24 exams** - BCVA OD 20/30 from 20/25 - OCT shows Stable improvement in IRF, persistent central PED; SRF slightly improved, partial PVD at 4 weeks - recommend IVV OD #22 (sample) today, 11.06.25 w/ f/u at 4 wks -- [employer] funding unavailable **previously alternating IVE HD and IVV OD, but now sticking w/ IVV** - pt wishes to proceed with injection - RBA of procedure discussed, questions answered - IVV informed consent obtained and signed, 02.06.25 (OD) - IVE HD informed consent obtained and signed, 03.13.25 (OD) - see procedure note  - f/u 4 weeks, DFE, OCT, possible injection  2. Age related macular degeneration, non-exudative, left eye  - mild/intermediate stage with mild drusen - The incidence, anatomy, and pathology of dry AMD, risk of progression, and the AREDS and AREDS 2 study including smoking risks discussed with patient.   - recommend Amsler grid monitoring  3. Epiretinal membrane, left eye  - mild ERM OS w/ early pucker, SN macula - BCVA 20/25 -- improved - asymptomatic, no metamorphopsia - no indication for surgery at this time - monitor  4,5. Hypertensive retinopathy OU - discussed importance of tight BP control - monitor  6. Pseudophakia OU  - s/p CE/IOL OU (Dr. Meridee - OS 01.14.25, OD 04.01.25)  - toric IOLs in good position  - s/p YAG OS w/ Dr. Meridee on 10.06.25  - monitor  Ophthalmic Meds Ordered this visit:  Meds ordered this  encounter  Medications   faricimab -svoa (VABYSMO ) 6mg /0.95mL intravitreal injection     Return in about 4 weeks (around 03/17/2024) for f/u, Ex. AMD, DFE, OCT, Possible, IVV, OD.  There are no Patient Instructions on file for this visit.   This document serves as a record of services personally performed by Redell JUDITHANN Hans, MD, PhD. It was created on their behalf by Auston Muzzy, COMT. The creation of this record is the provider's dictation and/or activities during the visit.  Electronically signed by: Auston Muzzy, COMT 02/18/24 12:57 PM  This document serves as a record of services personally performed by Redell JUDITHANN Hans, MD, PhD. It was created on their behalf by Wanda GEANNIE Keens, COT an ophthalmic technician. The creation of this record is the provider's dictation and/or activities during the visit.    Electronically signed by:  Wanda GEANNIE Keens, COT  02/18/24 12:57 PM  Redell JUDITHANN Hans, M.D., Ph.D. Diseases & Surgery of the Retina and Vitreous [facility]  I have reviewed the above documentation for accuracy and completeness, and I agree with the above. Redell JUDITHANN Hans, M.D., Ph.D. 02/18/24 1:02 PM   Abbreviations: M myopia (nearsighted); A astigmatism; H hyperopia (farsighted); P presbyopia; Mrx spectacle prescription;  CTL contact lenses; OD right eye; OS left eye; OU both eyes  XT exotropia; ET esotropia; PEK punctate epithelial keratitis; PEE punctate epithelial erosions; DES dry eye syndrome; MGD meibomian gland dysfunction; ATs artificial tears; PFAT's preservative free artificial tears; NSC nuclear sclerotic cataract; PSC posterior subcapsular cataract; ERM epi-retinal membrane; PVD posterior vitreous detachment; RD retinal detachment; DM diabetes mellitus; DR  diabetic retinopathy; NPDR non-proliferative diabetic retinopathy; PDR proliferative diabetic retinopathy; CSME clinically significant macular edema; DME diabetic macular edema; dbh dot blot  hemorrhages; CWS cotton wool spot; POAG primary open angle glaucoma; C/D cup-to-disc ratio; HVF humphrey visual field; GVF goldmann visual field; OCT optical coherence tomography; IOP intraocular pressure; BRVO Branch retinal vein occlusion; CRVO central retinal vein occlusion; CRAO central retinal artery occlusion; BRAO branch retinal artery occlusion; RT retinal tear; SB scleral buckle; PPV pars plana vitrectomy; VH Vitreous hemorrhage; PRP panretinal laser photocoagulation; IVK intravitreal kenalog; VMT vitreomacular traction; MH Macular hole;  NVD neovascularization of the disc; NVE neovascularization elsewhere; AREDS age related eye disease study; ARMD age related macular degeneration; POAG primary open angle glaucoma; EBMD epithelial/anterior basement membrane dystrophy; ACIOL anterior chamber intraocular lens; IOL intraocular lens; PCIOL posterior chamber intraocular lens; Phaco/IOL phacoemulsification with intraocular lens placement; PRK photorefractive keratectomy; LASIK laser assisted in situ keratomileusis; HTN hypertension; DM diabetes mellitus; COPD chronic obstructive pulmonary disease

## 2024-02-18 ENCOUNTER — Ambulatory Visit (INDEPENDENT_AMBULATORY_CARE_PROVIDER_SITE_OTHER): Admitting: Ophthalmology

## 2024-02-18 ENCOUNTER — Encounter (INDEPENDENT_AMBULATORY_CARE_PROVIDER_SITE_OTHER): Payer: Self-pay | Admitting: Ophthalmology

## 2024-02-18 DIAGNOSIS — H353211 Exudative age-related macular degeneration, right eye, with active choroidal neovascularization: Secondary | ICD-10-CM

## 2024-02-18 DIAGNOSIS — I10 Essential (primary) hypertension: Secondary | ICD-10-CM

## 2024-02-18 DIAGNOSIS — Z961 Presence of intraocular lens: Secondary | ICD-10-CM

## 2024-02-18 DIAGNOSIS — H35372 Puckering of macula, left eye: Secondary | ICD-10-CM

## 2024-02-18 DIAGNOSIS — H35033 Hypertensive retinopathy, bilateral: Secondary | ICD-10-CM

## 2024-02-18 DIAGNOSIS — H353122 Nonexudative age-related macular degeneration, left eye, intermediate dry stage: Secondary | ICD-10-CM

## 2024-02-18 MED ORDER — FARICIMAB-SVOA 6 MG/0.05ML IZ SOSY
6.0000 mg | PREFILLED_SYRINGE | INTRAVITREAL | Status: AC | PRN
  Administered 2024-02-18: 6 mg via INTRAVITREAL

## 2024-03-14 ENCOUNTER — Emergency Department (HOSPITAL_BASED_OUTPATIENT_CLINIC_OR_DEPARTMENT_OTHER)

## 2024-03-14 ENCOUNTER — Encounter (HOSPITAL_BASED_OUTPATIENT_CLINIC_OR_DEPARTMENT_OTHER): Payer: Self-pay | Admitting: Emergency Medicine

## 2024-03-14 ENCOUNTER — Emergency Department (HOSPITAL_BASED_OUTPATIENT_CLINIC_OR_DEPARTMENT_OTHER)
Admission: EM | Admit: 2024-03-14 | Discharge: 2024-03-14 | Disposition: A | Discharge status: Home / Self Care | Attending: Emergency Medicine | Admitting: Emergency Medicine

## 2024-03-14 ENCOUNTER — Other Ambulatory Visit: Payer: Self-pay

## 2024-03-14 DIAGNOSIS — R1030 Lower abdominal pain, unspecified: Secondary | ICD-10-CM | POA: Diagnosis present

## 2024-03-14 DIAGNOSIS — N200 Calculus of kidney: Secondary | ICD-10-CM

## 2024-03-14 DIAGNOSIS — R7989 Other specified abnormal findings of blood chemistry: Secondary | ICD-10-CM | POA: Insufficient documentation

## 2024-03-14 DIAGNOSIS — N132 Hydronephrosis with renal and ureteral calculous obstruction: Secondary | ICD-10-CM | POA: Insufficient documentation

## 2024-03-14 DIAGNOSIS — R197 Diarrhea, unspecified: Secondary | ICD-10-CM | POA: Diagnosis not present

## 2024-03-14 LAB — URINALYSIS, W/ REFLEX TO CULTURE (INFECTION SUSPECTED)
Bilirubin Urine: NEGATIVE
Glucose, UA: NEGATIVE mg/dL
Hgb urine dipstick: NEGATIVE
Ketones, ur: NEGATIVE mg/dL
Nitrite: NEGATIVE
Protein, ur: NEGATIVE mg/dL
Specific Gravity, Urine: 1.01 (ref 1.005–1.030)
pH: 6.5 (ref 5.0–8.0)

## 2024-03-14 LAB — CBC WITH DIFFERENTIAL/PLATELET
Abs Immature Granulocytes: 0.04 K/uL (ref 0.00–0.07)
Basophils Absolute: 0.1 K/uL (ref 0.0–0.1)
Basophils Relative: 1 %
Eosinophils Absolute: 0.5 K/uL (ref 0.0–0.5)
Eosinophils Relative: 5 %
HCT: 42.3 % (ref 36.0–46.0)
Hemoglobin: 14.3 g/dL (ref 12.0–15.0)
Immature Granulocytes: 0 %
Lymphocytes Relative: 47 %
Lymphs Abs: 4.9 K/uL — ABNORMAL HIGH (ref 0.7–4.0)
MCH: 30.3 pg (ref 26.0–34.0)
MCHC: 33.8 g/dL (ref 30.0–36.0)
MCV: 89.6 fL (ref 80.0–100.0)
Monocytes Absolute: 0.8 K/uL (ref 0.1–1.0)
Monocytes Relative: 8 %
Neutro Abs: 4 K/uL (ref 1.7–7.7)
Neutrophils Relative %: 39 %
Platelets: 244 K/uL (ref 150–400)
RBC: 4.72 MIL/uL (ref 3.87–5.11)
RDW: 12.9 % (ref 11.5–15.5)
WBC: 10.3 K/uL (ref 4.0–10.5)
nRBC: 0 % (ref 0.0–0.2)

## 2024-03-14 LAB — COMPREHENSIVE METABOLIC PANEL WITH GFR
ALT: 35 U/L (ref 0–44)
AST: 42 U/L — ABNORMAL HIGH (ref 15–41)
Albumin: 4.2 g/dL (ref 3.5–5.0)
Alkaline Phosphatase: 51 U/L (ref 38–126)
Anion gap: 15 (ref 5–15)
BUN: 8 mg/dL (ref 8–23)
CO2: 20 mmol/L — ABNORMAL LOW (ref 22–32)
Calcium: 9.3 mg/dL (ref 8.9–10.3)
Chloride: 107 mmol/L (ref 98–111)
Creatinine, Ser: 1.04 mg/dL — ABNORMAL HIGH (ref 0.44–1.00)
GFR, Estimated: 55 mL/min — ABNORMAL LOW (ref >60)
Glucose, Bld: 106 mg/dL — ABNORMAL HIGH (ref 70–99)
Potassium: 3.5 mmol/L (ref 3.5–5.1)
Sodium: 141 mmol/L (ref 135–145)
Total Bilirubin: 0.4 mg/dL (ref 0.0–1.2)
Total Protein: 6.8 g/dL (ref 6.5–8.1)

## 2024-03-14 MED ORDER — TAMSULOSIN HCL 0.4 MG PO CAPS: 0.4000 mg | ORAL_CAPSULE | Freq: Every day | ORAL | 0 refills | Status: AC

## 2024-03-14 MED ORDER — IOHEXOL 300 MG/ML  SOLN
100.0000 mL | Freq: Once | INTRAMUSCULAR | Status: AC | PRN
  Administered 2024-03-14: 100 mL via INTRAVENOUS

## 2024-03-14 NOTE — ED Notes (Signed)
 Pt states she is unable to provide urine sample at this time. Pt told to notify staff if/when she is able to.

## 2024-03-14 NOTE — ED Notes (Signed)
 Report received from Marolyn, [place]. Assuming pt care at this time.

## 2024-03-14 NOTE — ED Triage Notes (Signed)
 Dysuria and lower abd pain x 5 days .

## 2024-03-14 NOTE — ED Notes (Signed)
Patient is unable to give a urine specimen at this time  

## 2024-03-14 NOTE — ED Provider Notes (Signed)
 [facility] Provider Note   CSN: [id number] Arrival date & time: 03/14/24  9063     Patient presents with: Dysuria   Kimberly Noble is a 78 y.o. female with PMHx HLD who presents to ED concerned for intermittent and short term suprapubic pains x5 days. Denies dysuria or hematuria. Endorses intermittent loose stools related to taking stool softeners. Denies hematochezia. Denies vaginal bleeding or discharge. Denies fever, nausea, vomiting. Denies hx of UTI's.    Dysuria Associated symptoms: abdominal pain        Prior to Admission medications   Medication Sig Start Date End Date Taking? Authorizing Provider  FLUoxetine (PROZAC) 20 MG capsule Take 60 mg by mouth every morning. 03/07/24  Yes [provider]  mirtazapine (REMERON SOL-TAB) 30 MG disintegrating tablet Take 30 mg by mouth at bedtime. 03/06/24  Yes [provider]  tamsulosin (FLOMAX) 0.4 MG CAPS capsule Take 1 capsule (0.4 mg total) by mouth daily for 5 days. 03/14/24 03/19/24 Yes Abrish Erny, Nidia F, PA-C  ALPRAZolam  (XANAX ) 1 MG tablet Take 1 mg by mouth 3 (three) times daily. 7am, 1pm, 7pm    [provider]  Ascorbic Acid (VITAMIN C PO) Take 1 capsule by mouth daily. supplement from [employer], Historical, MD  Biotin w/ Vitamins C & E (HAIR/SKIN/NAILS PO) Take 1 capsule by mouth daily. supplement from [employer], Historical, MD  CALCIUM CARBONATE-VITAMIN D PO Take 1 capsule by mouth daily. supplement from [employer], Historical, MD  Cholecalciferol (VITAMIN D3 PO) Take 1 capsule by mouth daily. supplement from [employer], Historical, MD  Coenzyme Q10 (COQ10 PO) Take 1 capsule by mouth daily.    [provider]  CRANBERRY PO Take 1 capsule by mouth daily. supplement from [employer], Historical, MD  Multiple Vitamin (MULTIVITAMIN WITH MINERALS) TABS tablet Take 1 tablet by mouth daily.  supplement from [employer], Historical, MD  Omega-3 Fatty Acids (FISH OIL PO) Take 1 capsule by mouth daily. supplement from [employer], Historical, MD  OVER THE COUNTER MEDICATION Take 1 capsule by mouth daily. Joint support supplement from [employer], Historical, MD  OVER THE COUNTER MEDICATION Take 1 capsule by mouth daily. Cholesterol care supplement from [facility]    [provider]  OVER THE COUNTER MEDICATION Take 1 capsule by mouth daily. Circulation and vein support supplement from [employer], Historical, MD  OVER THE COUNTER MEDICATION Take 1 capsule by mouth daily. Green vegetable supplement from [employer], Historical, MD  OVER THE COUNTER MEDICATION Take 1 capsule by mouth daily. Liver and brain supplement from [employer], Historical, MD  OVER THE COUNTER MEDICATION Take 1 capsule by mouth daily. Eye vitamin with lutein - supplement from [employer], Historical, MD  VITAMIN K PO Take 1 capsule by mouth daily. supplement from [employer], Historical, MD    Allergies: Crab extract and Penicillins    Review of Systems  Gastrointestinal:  Positive for abdominal pain.    Updated Vital Signs BP 127/79 (BP Location: Right Arm)   Pulse 69   Temp 98.1 F (36.7 C) (Oral)   Resp 16   Ht 4' 11 (1.499 m)   Wt  55.8 kg   SpO2 98%   BMI 24.85 kg/m   Physical Exam Vitals and nursing note reviewed.  Constitutional:      General: She is not in acute distress.    Appearance: She is not ill-appearing or toxic-appearing.  HENT:     Head: Normocephalic and atraumatic.     Mouth/Throat:     Mouth: Mucous membranes are moist.  Eyes:     General: No scleral icterus.       Right eye: No discharge.        Left eye: No discharge.     Conjunctiva/sclera: Conjunctivae normal.  Cardiovascular:     Rate and Rhythm: Normal rate and regular rhythm.     Pulses: Normal pulses.     Heart sounds: Normal heart  sounds. No murmur heard. Pulmonary:     Effort: Pulmonary effort is normal. No respiratory distress.     Breath sounds: Normal breath sounds. No wheezing, rhonchi or rales.  Abdominal:     General: Abdomen is flat. Bowel sounds are normal. There is no distension.     Palpations: Abdomen is soft. There is no mass.     Tenderness: There is no abdominal tenderness.  Musculoskeletal:     Right lower leg: No edema.     Left lower leg: No edema.  Skin:    General: Skin is warm and dry.     Findings: No rash.  Neurological:     General: No focal deficit present.     Mental Status: She is alert and oriented to person, place, and time. Mental status is at baseline.  Psychiatric:        Mood and Affect: Mood normal.        Behavior: Behavior normal.     (all labs ordered are listed, but only abnormal results are displayed) Labs Reviewed  URINALYSIS, W/ REFLEX TO CULTURE (INFECTION SUSPECTED) - Abnormal; Notable for the following components:      Result Value   Leukocytes,Ua SMALL (*)    Bacteria, UA RARE (*)    All other components within normal limits  COMPREHENSIVE METABOLIC PANEL WITH GFR - Abnormal; Notable for the following components:   CO2 20 (*)    Glucose, Bld 106 (*)    Creatinine, Ser 1.04 (*)    AST 42 (*)    GFR, Estimated 55 (*)    All other components within normal limits  CBC WITH DIFFERENTIAL/PLATELET - Abnormal; Notable for the following components:   Lymphs Abs 4.9 (*)    All other components within normal limits    EKG: None  Radiology: CT ABDOMEN PELVIS W CONTRAST Result Date: 03/14/2024 CLINICAL DATA:  Lower abdominal pain and dysuria. EXAM: CT ABDOMEN AND PELVIS WITH CONTRAST TECHNIQUE: Multidetector CT imaging of the abdomen and pelvis was performed using the standard protocol following bolus administration of intravenous contrast. RADIATION DOSE REDUCTION: This exam was performed according to the departmental dose-optimization program which includes  automated exposure control, adjustment of the mA and/or kV according to patient size and/or use of iterative reconstruction technique. CONTRAST:  100mL OMNIPAQUE IOHEXOL 300 MG/ML  SOLN COMPARISON:  None Available. FINDINGS: Lower chest: No acute findings. Heart size normal. No pericardial or pleural effusion. Distal esophagus is grossly unremarkable. Hepatobiliary: Tiny cyst in the left hepatic lobe. No specific follow-up necessary. Liver and gallbladder are otherwise unremarkable. No biliary ductal dilatation. Pancreas: Negative. Spleen: Negative. Adrenals/Urinary Tract: Adrenal glands and right kidney are unremarkable. Right ureter is decompressed. Small low-attenuation  lesions in the left kidney, too small to characterize. Stone in the lower pole left kidney measures approximately 7 x 12 mm. Mild left hydronephrosis with slight hyperattenuation of the pelvic and ureteral walls. Multiple stones in the distal left ureter (2/62-65) measure up to approximately 5 mm (2/63). Bladder is unremarkable. Stomach/Bowel: Stomach, small bowel, appendix and colon are unremarkable. Vascular/Lymphatic: Atherosclerotic calcification of the aorta. No pathologically enlarged lymph nodes. Reproductive: Uterus is visualized. No adnexal mass. 1.6 cm simple appearing right adnexal cyst. No follow-up imaging recommended. Note: This recommendation does not apply to premenarchal patients and to those with increased risk (genetic, family history, elevated tumor markers or other high-risk factors) of ovarian cancer. Reference: JACR 2020 Feb; 17(2):248-254. Other: No free fluid.  Mesenteries and peritoneum are unremarkable. Musculoskeletal: Mild levoconvex curvature of the lumbar spine. Minimal degenerative change in the spine. IMPRESSION: 1. Mild left hydronephrosis secondary to multiple stones in the distal left ureter which measure up to 5 mm. 2. Left renal stone. 3.  Aortic atherosclerosis (ICD10-I70.0). Electronically Signed   By:  Newell Eke M.D.   On: 03/14/2024 11:38     Procedures   Medications Ordered in the ED  iohexol (OMNIPAQUE) 300 MG/ML solution 100 mL (100 mLs Intravenous Contrast Given 03/14/24 1105)                                    Medical Decision Making Amount and/or Complexity of Data Reviewed Labs: ordered. Radiology: ordered.  Risk Prescription drug management.    This patient presents to the ED for concern of abdominal pain, this involves an extensive number of treatment options, and is a complaint that carries with it a high risk of complications and morbidity.  The differential diagnosis includes gastroenteritis, colitis, small bowel obstruction, appendicitis, cholecystitis, pancreatitis, nephrolithiasis, UTI, pyelonephritis   Co morbidities that complicate the patient evaluation  HLD   Additional history obtained:  No PCP listed in chart   Problem List / ED Course / Critical interventions / Medication management  Patient presented for abdominal pain.  Denies dysuria or hematuria.  Endorses intermittent and short-term and suprapubic twinges of pain.  Physical exam reassuring.  Patient afebrile with stable vitals.   I Ordered, and personally interpreted labs.  UA not concerning for infection.  CBC without leukocytosis or anemia.  CMP with mild elevation in creatinine at 1.04. I ordered imaging studies including CT Abd/Pelvis with contrast: evaluate for structural/surgical etiology of patients' severe abdominal pain. I independently visualized and interpreted imaging and I agree with the radiologist interpretation of mild left hydronephrosis with multiple stones in the distal left ureter. Consulted with urology provider on-call Ole Bras who recommends some fluid resuscitation, Flomax, pain control and urine straining.  Patient can follow-up outpatient. Offered patient IV fluids, patient stating that she feels like she is okay to hydrate orally.  Patient has been  without abdominal pain since arriving to ED and is tolerating her kidney stones well.  Will prescribe Flomax.  Educated patient on Advil and Tylenol  for pain management.  Patient agrees to follow-up with outpatient urology. Staffed with Dr. Darra who agrees with plan. I have reviewed the patients home medicines and have made adjustments as needed The patient has been appropriately medically screened and/or stabilized in the ED. I have low suspicion for any other emergent medical condition which would require further screening, evaluation or treatment in the ED or require inpatient management.  At time of discharge the patient is hemodynamically stable and in no acute distress. I have discussed work-up results and diagnosis with patient and answered all questions. Patient is agreeable with discharge plan. We discussed strict return precautions for returning to the emergency department and they verbalized understanding.     Social Determinants of Health:  geriatric       Final diagnoses:  Kidney stone    ED Discharge Orders          Ordered    tamsulosin (FLOMAX) 0.4 MG CAPS capsule  Daily        03/14/24 1350               Hoy Nidia FALCON, [place] 03/14/24 1354    Long, Joshua G, MD 03/15/24 [phone]

## 2024-03-14 NOTE — Discharge Instructions (Signed)
 Please follow-up with urology.  Please also follow-up with primary care.  Seek emergency care experiencing any new or worsening symptoms.  Alternating between 650 mg Tylenol  and 400 mg Advil: The best way to alternate taking Acetaminophen  (example Tylenol ) and Ibuprofen (example Advil/Motrin) is to take them 3 hours apart. For example, if you take ibuprofen at 6 am you can then take Tylenol  at 9 am. You can continue this regimen throughout the day, making sure you do not exceed the recommended maximum dose for each drug.

## 2024-03-16 NOTE — Progress Notes (Shared)
 [facility] - Clinic Note  03/18/2024    CHIEF COMPLAINT Patient presents for No chief complaint on file.  HISTORY OF PRESENT ILLNESS: Kimberly Noble is a 78 y.o. female who presents to the clinic today for:    Patient feels the vision is the same.  She states that the eyes have like a film over them. She is using AT's.  Referring physician: No referring provider defined for this encounter.  HISTORICAL INFORMATION:   Selected notes from the MEDICAL RECORD NUMBER Referred by Dr. [name] for PED OD LEE:  Ocular Hx- PMH-    CURRENT MEDICATIONS: No current outpatient medications on file. (Ophthalmic Drugs)   No current facility-administered medications for this visit. (Ophthalmic Drugs)   Current Outpatient Medications (Other)  Medication Sig   ALPRAZolam  (XANAX ) 1 MG tablet Take 1 mg by mouth 3 (three) times daily. 7am, 1pm, 7pm   Ascorbic Acid (VITAMIN C PO) Take 1 capsule by mouth daily. supplement from [employer]   Biotin w/ Vitamins C & E (HAIR/SKIN/NAILS PO) Take 1 capsule by mouth daily. supplement from [facility]   CALCIUM CARBONATE-VITAMIN D PO Take 1 capsule by mouth daily. supplement from [facility]   Cholecalciferol (VITAMIN D3 PO) Take 1 capsule by mouth daily. supplement from [facility]   Coenzyme Q10 (COQ10 PO) Take 1 capsule by mouth daily.   CRANBERRY PO Take 1 capsule by mouth daily. supplement from [facility]   FLUoxetine (PROZAC) 20 MG capsule Take 60 mg by mouth every morning.   mirtazapine (REMERON SOL-TAB) 30 MG disintegrating tablet Take 30 mg by mouth at bedtime.   Multiple Vitamin (MULTIVITAMIN WITH MINERALS) TABS tablet Take 1 tablet by mouth daily. supplement from [facility]   Omega-3 Fatty Acids (FISH OIL PO) Take 1 capsule by mouth daily. supplement from [facility]   OVER THE COUNTER MEDICATION Take 1 capsule by mouth daily. Joint support supplement from [employer] [facility]   OVER THE COUNTER MEDICATION Take 1 capsule by mouth daily. Cholesterol care  supplement from [facility]   OVER THE COUNTER MEDICATION Take 1 capsule by mouth daily. Circulation and vein support supplement from [facility]   OVER THE COUNTER MEDICATION Take 1 capsule by mouth daily. Green vegetable supplement from [employer]   OVER THE COUNTER MEDICATION Take 1 capsule by mouth daily. Liver and brain supplement from [facility]   OVER THE COUNTER MEDICATION Take 1 capsule by mouth daily. Eye vitamin with lutein - supplement from [facility]   tamsulosin (FLOMAX) 0.4 MG CAPS capsule Take 1 capsule (0.4 mg total) by mouth daily for 5 days.   VITAMIN K PO Take 1 capsule by mouth daily. supplement from [facility]   No current facility-administered medications for this visit. (Other)   REVIEW OF SYSTEMS:   ALLERGIES Allergies  Allergen Reactions   Crab Extract Anaphylaxis    Eating crab causes severe allergy. Claims she is not allergic to all shellfish.    Penicillins Rash    Has patient had a PCN reaction causing immediate rash, facial/tongue/throat swelling, SOB or lightheadedness with hypotension: Yes Has patient had a PCN reaction causing severe rash involving mucus membranes or skin necrosis: No Has patient had a PCN reaction that required hospitalization No Has patient had a PCN reaction occurring within the last 10 years: No If all of the above answers are NO, then may proceed with Cephalosporin use.    PAST MEDICAL HISTORY Past Medical History:  Diagnosis  Date   Agoraphobia with panic attacks    Anxiety attack    Major depression    Superficial laceration of hand ~ 03/2015   left thumb   Past Surgical History:  Procedure Laterality Date   TONSILLECTOMY  1950's   FAMILY HISTORY Family History  Problem Relation Age of Onset   Macular degeneration Mother    SOCIAL HISTORY Social History   Tobacco Use   Smoking status: Every Day    Current packs/day: 0.33    Average packs/day: 0.3 packs/day for 40.0 years (13.2 ttl pk-yrs)    Types: Cigarettes   Smokeless  tobacco: Never  Vaping Use   Vaping status: Never Used  Substance Use Topics   Alcohol use: No   Drug use: No       OPHTHALMIC EXAM:  Not recorded    IMAGING AND PROCEDURES  Imaging and Procedures for 03/18/2024          ASSESSMENT/PLAN:  No diagnosis found.  1. 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09.25), #17 (02.06.25 sample), #18 (06.05.25 sample], #19 (08.14.25 sample), #20 (09.11.25 sample), #21 (10.09.25 sample) #22(11.06.25) sample  ================================ - s/p IVE HD OD #1 (03.13.25), #2 (04.10.25), #3 (sample 05.08.25), #4 (sample 07.10.25) **history of increased IRF at 5 weeks, noted on 07.11.25, 03.13.25 and 05.30.24 exams** - BCVA OD 20/30 from 20/25 - OCT shows Stable improvement in IRF, persistent central PED; SRF slightly improved, partial PVD at 4 weeks - recommend IVV OD #23 (sample) today, 12.05.25 w/ f/u at 4 wks -- [employer] funding unavailable **previously alternating IVE HD and IVV OD, but now sticking w/ IVV** - pt wishes to proceed with injection - RBA of procedure discussed, questions answered - IVV informed consent obtained and signed, 02.06.25 (OD) - IVE HD informed consent obtained and signed, 03.13.25 (OD) - see procedure note  - f/u 4 weeks, DFE, OCT, possible injection  2. Age related macular degeneration, non-exudative, left eye  -  mild/intermediate stage with mild drusen - The incidence, anatomy, and pathology of dry AMD, risk of progression, and the AREDS and AREDS 2 study including smoking risks discussed with patient.   - recommend Amsler grid monitoring  3. Epiretinal membrane, left eye  - mild ERM OS w/ early pucker, SN macula - BCVA 20/25 -- improved - asymptomatic, no metamorphopsia - no indication for surgery at this time - monitor  4,5. Hypertensive retinopathy OU - discussed importance of tight BP control - monitor  6. Pseudophakia OU  - s/p CE/IOL OU (Dr. Meridee - OS 01.14.25, OD 04.01.25)  - toric IOLs in good position  - s/p YAG OS w/ Dr. Meridee on 10.06.25  - monitor  Ophthalmic Meds Ordered this visit:  No orders of the defined types were placed in this encounter.    No follow-ups on file.  There are no Patient Instructions on file for this visit.   This document serves as a record of services personally performed by Redell JUDITHANN Hans, MD, PhD. It was  created on their behalf by Delon Newness COT, an ophthalmic technician. The creation of this record is the provider's dictation and/or activities during the visit.    Electronically signed by: Delon Newness COT 12.03.25  8:03 AM   Abbreviations: M myopia (nearsighted); A astigmatism; H hyperopia (farsighted); P presbyopia; Mrx spectacle prescription;  CTL contact lenses; OD right eye; OS left eye; OU both eyes  XT exotropia; ET esotropia; PEK punctate epithelial keratitis; PEE punctate epithelial erosions; DES dry eye syndrome; MGD meibomian gland dysfunction; ATs artificial tears; PFAT's preservative free artificial tears; NSC nuclear sclerotic cataract; PSC posterior subcapsular cataract; ERM epi-retinal membrane; PVD posterior vitreous detachment; RD retinal detachment; DM diabetes mellitus; DR diabetic retinopathy; NPDR non-proliferative diabetic retinopathy; PDR proliferative diabetic retinopathy; CSME clinically  significant macular edema; DME diabetic macular edema; dbh dot blot hemorrhages; CWS cotton wool spot; POAG primary open angle glaucoma; C/D cup-to-disc ratio; HVF humphrey visual field; GVF goldmann visual field; OCT optical coherence tomography; IOP intraocular pressure; BRVO Branch retinal vein occlusion; CRVO central retinal vein occlusion; CRAO central retinal artery occlusion; BRAO branch retinal artery occlusion; RT retinal tear; SB scleral buckle; PPV pars plana vitrectomy; VH Vitreous hemorrhage; PRP panretinal laser photocoagulation; IVK intravitreal kenalog; VMT vitreomacular traction; MH Macular hole;  NVD neovascularization of the disc; NVE neovascularization elsewhere; AREDS age related eye disease study; ARMD age related macular degeneration; POAG primary open angle glaucoma; EBMD epithelial/anterior basement membrane dystrophy; ACIOL anterior chamber intraocular lens; IOL intraocular lens; PCIOL posterior chamber intraocular lens; Phaco/IOL phacoemulsification with intraocular lens placement; PRK photorefractive keratectomy; LASIK laser assisted in situ keratomileusis; HTN hypertension; DM diabetes mellitus; COPD chronic obstructive pulmonary disease

## 2024-03-18 ENCOUNTER — Encounter (INDEPENDENT_AMBULATORY_CARE_PROVIDER_SITE_OTHER): Admitting: Ophthalmology

## 2024-03-18 DIAGNOSIS — H35372 Puckering of macula, left eye: Secondary | ICD-10-CM

## 2024-03-18 DIAGNOSIS — H353211 Exudative age-related macular degeneration, right eye, with active choroidal neovascularization: Secondary | ICD-10-CM

## 2024-03-18 DIAGNOSIS — Z961 Presence of intraocular lens: Secondary | ICD-10-CM

## 2024-03-18 DIAGNOSIS — H353122 Nonexudative age-related macular degeneration, left eye, intermediate dry stage: Secondary | ICD-10-CM

## 2024-03-18 DIAGNOSIS — I10 Essential (primary) hypertension: Secondary | ICD-10-CM

## 2024-03-18 DIAGNOSIS — H35033 Hypertensive retinopathy, bilateral: Secondary | ICD-10-CM

## 2024-03-21 NOTE — Progress Notes (Signed)
 [facility] - Clinic Note  03/23/2024    CHIEF COMPLAINT Patient presents for No chief complaint on file.  HISTORY OF PRESENT ILLNESS: Kimberly Noble is a 78 y.o. female who presents to the clinic today for:    Patient feels the vision is the same.  She states that the eyes have like a film over them. She is using AT's.  Referring physician: No referring provider defined for this encounter.  HISTORICAL INFORMATION:   Selected notes from the MEDICAL RECORD NUMBER Referred by Dr. [name] for PED OD LEE:  Ocular Hx- PMH-    CURRENT MEDICATIONS: No current outpatient medications on file. (Ophthalmic Drugs)   No current facility-administered medications for this visit. (Ophthalmic Drugs)   Current Outpatient Medications (Other)  Medication Sig   ALPRAZolam  (XANAX ) 1 MG tablet Take 1 mg by mouth 3 (three) times daily. 7am, 1pm, 7pm   Ascorbic Acid (VITAMIN C PO) Take 1 capsule by mouth daily. supplement from [facility]   Biotin w/ Vitamins C & E (HAIR/SKIN/NAILS PO) Take 1 capsule by mouth daily. supplement from [facility]   CALCIUM CARBONATE-VITAMIN D PO Take 1 capsule by mouth daily. supplement from [facility]   Cholecalciferol (VITAMIN D3 PO) Take 1 capsule by mouth daily. supplement from [facility]   Coenzyme Q10 (COQ10 PO) Take 1 capsule by mouth daily.   CRANBERRY PO Take 1 capsule by mouth daily. supplement from [facility]   FLUoxetine (PROZAC) 20 MG capsule Take 60 mg by mouth every morning.   mirtazapine (REMERON SOL-TAB) 30 MG disintegrating tablet Take 30 mg by mouth at bedtime.   Multiple Vitamin (MULTIVITAMIN WITH MINERALS) TABS tablet Take 1 tablet by mouth daily. supplement from [facility]   Omega-3 Fatty Acids (FISH OIL PO) Take 1 capsule by mouth daily. supplement from [facility]   OVER THE COUNTER MEDICATION Take 1 capsule by mouth daily. Joint support supplement from [facility]   OVER THE COUNTER MEDICATION Take 1 capsule by mouth daily. Cholesterol care  supplement from [facility]   OVER THE COUNTER MEDICATION Take 1 capsule by mouth daily. Circulation and vein support supplement from [facility]   OVER THE COUNTER MEDICATION Take 1 capsule by mouth daily. Green vegetable supplement from [employer]   OVER THE COUNTER MEDICATION Take 1 capsule by mouth daily. Liver and brain supplement from [facility]   OVER THE COUNTER MEDICATION Take 1 capsule by mouth daily. Eye vitamin with lutein - supplement from [facility]   VITAMIN K PO Take 1 capsule by mouth daily. supplement from [facility]   No current facility-administered medications for this visit. (Other)   REVIEW OF SYSTEMS:   ALLERGIES Allergies  Allergen Reactions   Crab Extract Anaphylaxis    Eating crab causes severe allergy. Claims she is not allergic to all shellfish.    Penicillins Rash    Has patient had a PCN reaction causing immediate rash, facial/tongue/throat swelling, SOB or lightheadedness with hypotension: Yes Has patient had a PCN reaction causing severe rash involving mucus membranes or skin necrosis: No Has patient had a PCN reaction that required hospitalization No Has patient had a PCN reaction occurring within the last 10 years: No If all of the above answers are NO, then may proceed with Cephalosporin use.    PAST MEDICAL HISTORY Past Medical History:  Diagnosis Date   Agoraphobia with panic attacks    Anxiety attack    Major depression  Superficial laceration of hand ~ 03/2015   left thumb   Past Surgical History:  Procedure Laterality Date   TONSILLECTOMY  1950's   FAMILY HISTORY Family History  Problem Relation Age of Onset   Macular degeneration Mother    SOCIAL HISTORY Social History   Tobacco Use   Smoking status: Every Day    Current packs/day: 0.33    Average packs/day: 0.3 packs/day for 40.0 years (13.2 ttl pk-yrs)    Types: Cigarettes   Smokeless tobacco: Never  Vaping Use   Vaping status: Never Used  Substance Use Topics   Alcohol use: No    Drug use: No       OPHTHALMIC EXAM:  Not recorded    IMAGING AND PROCEDURES  Imaging and Procedures for 03/23/2024          ASSESSMENT/PLAN:  No diagnosis found.   1. 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, right eye - pt presented initially on 3.16.23 with 2 wk history of central vision loss and distortion  - s/p IVA OD #1 (03.16.23), #2 (04.13.23), #3 (05.11.23), #4 (06.08.23)  -- IVA resistance ============================== - s/p IVE OD #1 (07.06.23), #2 (08.03.23), #3 (08.31.23), #4 (10.05.23)  -- IVE resistance =============================== - s/p IVV OD #1 (11.09.23), #2 (12.07.23), #3 (01.04.24), #4 (02.01.24), #5 (02.29.24), #6 (03.15.24), #7 (04.25.24), #8 (05.30.24), #9 (06.27.24), #10 (07.25.24), #11 (08.22.24), #12 (09.19.24), #13 (10.17.24), #14 (11.14.24), #15 (12.12.24), #16 (01.09.25), #17 (02.06.25 sample), #18 (06.05.25 sample], #19 (08.14.25 sample), #20 (09.11.25 sample), #21 (10.09.25 sample) #22(11.06.25) sample  ================================ - s/p IVE HD OD #1 (03.13.25), #2 (04.10.25), #3 (sample 05.08.25), #4 (sample 07.10.25) **history of increased IRF at 5 weeks, noted on 07.11.25, 03.13.25 and 05.30.24 exams** - BCVA OD 20/30 from 20/25 - OCT shows Stable improvement in IRF, persistent central PED; SRF slightly improved, partial PVD at 4 weeks - recommend IVV OD #23 (sample) today, 12.10.25 w/ f/u at 4 wks -- [employer] funding unavailable **previously alternating IVE HD and IVV OD, but now sticking w/ IVV** - pt wishes to proceed with injection - RBA of procedure discussed, questions answered - IVV informed consent obtained and signed, 02.06.25 (OD) - IVE HD informed consent obtained and signed, 03.13.25 (OD) - see procedure note  - f/u 4 weeks, DFE, OCT, possible injection  2. Age related macular degeneration, non-exudative, left eye  - mild/intermediate stage with mild drusen - The incidence, anatomy, and pathology of dry AMD,  risk of progression, and the AREDS and AREDS 2 study including smoking risks discussed with patient.   - recommend Amsler grid monitoring  3. Epiretinal membrane, left eye  - mild ERM OS w/ early pucker, SN macula - BCVA 20/25 -- improved - asymptomatic, no metamorphopsia - no indication for surgery at this time - monitor  4,5. Hypertensive retinopathy OU - discussed importance of tight BP control - monitor  6. Pseudophakia OU  - s/p CE/IOL OU (Dr. Meridee - OS 01.14.25, OD 04.01.25)  - toric IOLs in good position  - s/p YAG OS w/ Dr. Meridee on 10.06.25  - monitor  Ophthalmic Meds Ordered this visit:  No orders of the defined types were placed in this encounter.    No follow-ups on file.  There are no Patient Instructions on file for this visit.   This document serves as a record of services personally performed by Redell JUDITHANN Hans, MD, PhD. It was created on their behalf by Almetta Pesa, an ophthalmic technician. The creation of this record is the  provider's dictation and/or activities during the visit.    Electronically signed by: Almetta Pesa, OA, 03/21/24  12:04 PM   Abbreviations: M myopia (nearsighted); A astigmatism; H hyperopia (farsighted); P presbyopia; Mrx spectacle prescription;  CTL contact lenses; OD right eye; OS left eye; OU both eyes  XT exotropia; ET esotropia; PEK punctate epithelial keratitis; PEE punctate epithelial erosions; DES dry eye syndrome; MGD meibomian gland dysfunction; ATs artificial tears; PFAT's preservative free artificial tears; NSC nuclear sclerotic cataract; PSC posterior subcapsular cataract; ERM epi-retinal membrane; PVD posterior vitreous detachment; RD retinal detachment; DM diabetes mellitus; DR diabetic retinopathy; NPDR non-proliferative diabetic retinopathy; PDR proliferative diabetic retinopathy; CSME clinically significant macular edema; DME diabetic macular edema; dbh dot blot hemorrhages; CWS cotton wool spot; POAG  primary open angle glaucoma; C/D cup-to-disc ratio; HVF humphrey visual field; GVF goldmann visual field; OCT optical coherence tomography; IOP intraocular pressure; BRVO Branch retinal vein occlusion; CRVO central retinal vein occlusion; CRAO central retinal artery occlusion; BRAO branch retinal artery occlusion; RT retinal tear; SB scleral buckle; PPV pars plana vitrectomy; VH Vitreous hemorrhage; PRP panretinal laser photocoagulation; IVK intravitreal kenalog; VMT vitreomacular traction; MH Macular hole;  NVD neovascularization of the disc; NVE neovascularization elsewhere; AREDS age related eye disease study; ARMD age related macular degeneration; POAG primary open angle glaucoma; EBMD epithelial/anterior basement membrane dystrophy; ACIOL anterior chamber intraocular lens; IOL intraocular lens; PCIOL posterior chamber intraocular lens; Phaco/IOL phacoemulsification with intraocular lens placement; PRK photorefractive keratectomy; LASIK laser assisted in situ keratomileusis; HTN hypertension; DM diabetes mellitus; COPD chronic obstructive pulmonary disease

## 2024-03-23 ENCOUNTER — Ambulatory Visit (INDEPENDENT_AMBULATORY_CARE_PROVIDER_SITE_OTHER): Admitting: Ophthalmology

## 2024-03-23 ENCOUNTER — Encounter (INDEPENDENT_AMBULATORY_CARE_PROVIDER_SITE_OTHER): Payer: Self-pay | Admitting: Ophthalmology

## 2024-03-23 DIAGNOSIS — H35033 Hypertensive retinopathy, bilateral: Secondary | ICD-10-CM | POA: Diagnosis not present

## 2024-03-23 DIAGNOSIS — H353122 Nonexudative age-related macular degeneration, left eye, intermediate dry stage: Secondary | ICD-10-CM | POA: Diagnosis not present

## 2024-03-23 DIAGNOSIS — I10 Essential (primary) hypertension: Secondary | ICD-10-CM | POA: Diagnosis not present

## 2024-03-23 DIAGNOSIS — Z961 Presence of intraocular lens: Secondary | ICD-10-CM | POA: Diagnosis not present

## 2024-03-23 DIAGNOSIS — H35372 Puckering of macula, left eye: Secondary | ICD-10-CM | POA: Diagnosis not present

## 2024-03-23 DIAGNOSIS — H353211 Exudative age-related macular degeneration, right eye, with active choroidal neovascularization: Secondary | ICD-10-CM | POA: Diagnosis not present

## 2024-03-23 MED ORDER — FARICIMAB-SVOA 6 MG/0.05ML IZ SOLN
6.0000 mg | INTRAVITREAL | Status: AC | PRN
  Administered 2024-03-23: 6 mg via INTRAVITREAL

## 2024-04-15 NOTE — Progress Notes (Signed)
 " [facility] - Clinic Note  04/28/2024    CHIEF COMPLAINT Patient presents for Retina Follow Up  HISTORY OF PRESENT ILLNESS: Kimberly Noble is a 79 y.o. female who presents to the clinic today for:   HPI     Retina Follow Up   Patient presents with  Wet AMD.  In right eye.  This started 2.5 years ago.  Severity is moderate.  Duration of 4 weeks.  Since onset it is stable.  I, the attending physician,  performed the HPI with the patient and updated documentation appropriately.        Comments   Pt is here for a 4 week f/u for ARMD OD. Pt states she is getting glasses from Dr.Van. Pt denies flashes and floaters. Pt uses Systane OU once a day.       Last edited by [name], MD on 04/28/2024  8:41 PM.    Patient states she thought she had a UTI a few weeks ago and was actually diagnosed w/ kidney stones. Seen at [facility]. Could have an ablation or a stent placed, uncertain. No changes notice vision wise  Referring physician: No referring provider defined for this encounter.  HISTORICAL INFORMATION:   Selected notes from the MEDICAL RECORD NUMBER Referred by Dr. [name] for PED OD LEE:  Ocular Hx- PMH-    CURRENT MEDICATIONS: No current outpatient medications on file. (Ophthalmic Drugs)   No current facility-administered medications for this visit. (Ophthalmic Drugs)   Current Outpatient Medications (Other)  Medication Sig   ALPRAZolam  (XANAX ) 1 MG tablet Take 1 mg by mouth 3 (three) times daily. 7am, 1pm, 7pm   Ascorbic Acid (VITAMIN C PO) Take 1 capsule by mouth daily. supplement from [facility]   Biotin w/ Vitamins C & E (HAIR/SKIN/NAILS PO) Take 1 capsule by mouth daily. supplement from [facility]   CALCIUM CARBONATE-VITAMIN D PO Take 1 capsule by mouth daily. supplement from [facility]   Cholecalciferol (VITAMIN D3 PO) Take 1 capsule by mouth daily. supplement from [facility]   Coenzyme Q10 (COQ10 PO) Take 1 capsule by mouth daily.    CRANBERRY PO Take 1 capsule by mouth daily. supplement from [facility]   FLUoxetine (PROZAC) 20 MG capsule Take 60 mg by mouth every morning.   mirtazapine (REMERON SOL-TAB) 30 MG disintegrating tablet Take 30 mg by mouth at bedtime.   Multiple Vitamin (MULTIVITAMIN WITH MINERALS) TABS tablet Take 1 tablet by mouth daily. supplement from [facility]   Omega-3 Fatty Acids (FISH OIL PO) Take 1 capsule by mouth daily. supplement from [facility]   OVER THE COUNTER MEDICATION Take 1 capsule by mouth daily. Joint support supplement from [facility]   OVER THE COUNTER MEDICATION Take 1 capsule by mouth daily. Cholesterol care supplement from [facility]   OVER THE COUNTER MEDICATION Take 1 capsule by mouth daily. Circulation and vein support supplement from [facility]   OVER THE COUNTER MEDICATION Take 1 capsule by mouth daily. Green vegetable supplement from [employer]   OVER THE COUNTER MEDICATION Take 1 capsule by mouth daily. Liver and brain supplement from [facility]   OVER THE COUNTER MEDICATION Take 1 capsule by mouth daily. Eye vitamin with lutein - supplement from [facility]   VITAMIN K PO Take 1 capsule by mouth daily. supplement from [facility]   No current facility-administered medications for this visit. (Other)   REVIEW OF SYSTEMS:   ALLERGIES Allergies  Allergen Reactions  Crab Extract Anaphylaxis    Eating crab causes severe allergy. Claims she is not allergic to all shellfish.    Penicillins Rash    Has patient had a PCN reaction causing immediate rash, facial/tongue/throat swelling, SOB or lightheadedness with hypotension: Yes Has patient had a PCN reaction causing severe rash involving mucus membranes or skin necrosis: No Has patient had a PCN reaction that required hospitalization No Has patient had a PCN reaction occurring within the last 10 years: No If all of the above answers are NO, then may proceed with Cephalosporin use.    PAST MEDICAL HISTORY Past Medical History:  Diagnosis Date    Agoraphobia with panic attacks    Anxiety attack    Major depression    Superficial laceration of hand ~ 03/2015   left thumb   Past Surgical History:  Procedure Laterality Date   TONSILLECTOMY  1950's   FAMILY HISTORY Family History  Problem Relation Age of Onset   Macular degeneration Mother    SOCIAL HISTORY Social History   Tobacco Use   Smoking status: Every Day    Current packs/day: 0.33    Average packs/day: 0.3 packs/day for 40.0 years (13.2 ttl pk-yrs)    Types: Cigarettes   Smokeless tobacco: Never  Vaping Use   Vaping status: Never Used  Substance Use Topics   Alcohol use: No   Drug use: No       OPHTHALMIC EXAM:  Base Eye Exam     Visual Acuity (Snellen - Linear)       Right Left   Dist [place] 20/30 20/30 -1   Dist ph [place]  25-2         Tonometry (Tonopen, 9:33 AM)       Right Left   Pressure 19 19         Pupils       Pupils Dark Light Shape React APD   Right PERRL 3 2 Round Brisk None   Left PERRL 3 2 Round Brisk None         Visual Fields       Left Right    Full Full         Extraocular Movement       Right Left    Full, Ortho Full, Ortho         Neuro/Psych     Oriented x3: Yes   Mood/Affect: Normal         Dilation     Both eyes: 1.0% Mydriacyl, 2.5% Phenylephrine @ 9:34 AM           Slit Lamp and Fundus Exam     Slit Lamp Exam       Right Left   Lids/Lashes Dermatochalasis - upper lid, mild MGD Dermatochalasis - upper lid, mild MGD   Conjunctiva/Sclera White and quiet White and quiet   Cornea mild arcus, trace tear film debris, well healed cataract wound, 1+ Punctate epithelial erosions mild arcus, trace PEE, trace tear film debris, Well healed temporal cataract wound   Anterior Chamber Deep and clear deep and clear   Iris Round and dilated Round and dilated   Lens Toric PC IOL in good position with marks at 0545 and 1145, 1+PCO toric PC IOL in good position with marks at 0115 and 0715, open PC    Anterior Vitreous Mild syneresis Mild syneresis, Posterior vitreous detachment, vitreous condensations, mild fine pigment         Fundus Exam  Right Left   Disc Pink and Sharp, temporal PPA, Compact Pink and Sharp, temporal PPA, Compact   C/D Ratio 0.5 0.5   Macula Blunted foveal reflex, +CNV with pigmented RPE rip and surrounding atrophy, trace shallow SRF, no frank heme Flat, Blunted foveal reflex, fine drusen, RPE mottling and clumping, No heme or edema   Vessels attenuated, Tortuous mild attenuation, mild tortuosity   Periphery Attached, reticular degeneration, focal pigment clumping IT, No heme Attached, reticular degeneration, focal pigment clumping IT, No heme           IMAGING AND PROCEDURES  Imaging and Procedures for 04/28/2024  OCT, Retina - OU - Both Eyes       Right Eye Quality was good. Central Foveal Thickness: 404. Progression has been stable. Findings include no IRF, no SRF, abnormal foveal contour, subretinal hyper-reflective material, pigment epithelial detachment, vitreomacular adhesion (Stable improvement in IRF, persistent central PED and focal SRF--slightly increased, partial PVD).   Left Eye Quality was good. Central Foveal Thickness: 275. Progression has been stable. Findings include normal foveal contour, no IRF, no SRF, retinal drusen , epiretinal membrane, macular pucker (Very mild drusen, mild ERM with early pucker SN mac).   Notes *Images captured and stored on drive  Diagnosis / Impression:  OD: exu ARMD - Stable improvement in IRF, persistent central PED and focal SRF--slightly increased, partial PVD OS: non-exu ARMD - Very mild drusen, mild ERM with early pucker SN mac  Clinical management:  See below  Abbreviations: NFP - Normal foveal profile. CME - cystoid macular edema. PED - pigment epithelial detachment. IRF - intraretinal fluid. SRF - subretinal fluid. EZ - ellipsoid zone. ERM - epiretinal membrane. ORA - outer retinal atrophy. ORT  - outer retinal tubulation. SRHM - subretinal hyper-reflective material. IRHM - intraretinal hyper-reflective material      Intravitreal Injection, Pharmacologic Agent - OD - Right Eye       Time Out 04/28/2024. 10:27 AM. Confirmed correct patient, procedure, site, and patient consented.   Anesthesia Topical anesthesia was used. Anesthetic medications included Lidocaine 2%, Proparacaine 0.5%.   Procedure Preparation included 5% betadine to ocular surface, eyelid speculum. A supplied (32g) needle was used.   Injection: 6 mg faricimab -svoa 6 MG/0.05ML (Patient supplied)   Route: Intravitreal, Site: Right Eye   NDC: 49757-903-98, Lot: [id number], Expiration date: 01/11/2026, Waste: 0 mL   Post-op Post injection exam found visual acuity of at least counting fingers. The patient tolerated the procedure well. There were no complications. The patient received written and verbal post procedure care education. Post injection medications were not given.   Notes **SAMPLE MEDICATION ADMINISTERED**              ASSESSMENT/PLAN:    ICD-10-CM   1. Exudative age-related macular degeneration of right eye with active choroidal neovascularization (HCC)  H35.3211 OCT, Retina - OU - Both Eyes    Intravitreal Injection, Pharmacologic Agent - OD - Right Eye    faricimab -svoa (VABYSMO ) 6mg /0.72mL intravitreal injection    2. 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 Epiretinal membrane (ERM) of left eye  H35.372     4. Essential hypertension  I10     5. Hypertensive retinopathy of both eyes  H35.033     6. Pseudophakia of both eyes  Z96.1       1. 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, right eye - pt presented initially on 3.16.23 with 2 wk history of central vision loss and  distortion  - s/p IVA OD #1 (03.16.23), #2 (04.13.23), #3 (05.11.23), #4 (06.08.23)  -- IVA resistance ============================== - s/p IVE OD #1  (07.06.23), #2 (08.03.23), #3 (08.31.23), #4 (10.05.23)  -- IVE resistance =============================== - s/p IVV OD #1 (11.09.23), #2 (12.07.23), #3 (01.04.24), #4 (02.01.24), #5 (02.29.24), #6 (03.15.24), #7 (04.25.24), #8 (05.30.24), #9 (06.27.24), #10 (07.25.24), #11 (08.22.24), #12 (09.19.24), #13 (10.17.24), #14 (11.14.24), #15 (12.12.24), #16 (01.09.25), #17 (02.06.25 sample), #18 (06.05.25 sample], #19 (08.14.25 sample), #20 (09.11.25 sample), #21 (10.09.25 sample) #22 (11.06.25 sample), #23 (12.10.25 sample)  ================================ - s/p IVE HD OD #1 (03.13.25), #2 (04.10.25), #3 (sample 05.08.25), #4 (sample 07.10.25) **history of increased IRF at 5 weeks, noted on 07.11.25, 03.13.25 and 05.30.24 exams** - BCVA OD 20/30 stable - OCT shows Stable improvement in IRF, persistent central PED and focal SRF--slightly increased, partial PVD at 5 weeks - recommend IVV sample today OD #24 (01.15.26) w/ f/u in 4-5 wks -- [employer] funding unavailable **previously alternating IVE HD and IVV OD, but now sticking w/ IVV** - pt wishes to proceed with injection - RBA of procedure discussed, questions answered - IVV informed consent obtained and signed, 02.06.25 (OD) - IVE HD informed consent obtained and signed, 03.13.25 (OD) - see procedure note  - f/u 4-5 weeks, DFE, OCT, possible injection  2. Age related macular degeneration, non-exudative, left eye  - mild/intermediate stage with mild drusen - The incidence, anatomy, and pathology of dry AMD, risk of progression, and the AREDS and AREDS 2 study including smoking risks discussed with patient.   - recommend Amsler grid monitoring  3. Epiretinal membrane, left eye  - mild ERM OS w/ early pucker, SN macula - BCVA 20/25 -- improved - asymptomatic, no metamorphopsia - no indication for surgery at this time - monitor  4,5. Hypertensive retinopathy OU - discussed importance of tight BP control - monitor  6. Pseudophakia OU  -  s/p CE/IOL OU (Dr. Meridee - OS 01.14.25, OD 04.01.25)  - toric IOLs in good position  - s/p YAG OS w/ Dr. Meridee on 10.06.25  - monitor  Ophthalmic Meds Ordered this visit:  Meds ordered this encounter  Medications   faricimab -svoa (VABYSMO ) 6mg /0.46mL intravitreal injection     Return for 4-5 weeks exu ARMD OD, DFE, OCT, Possible Injxn.  There are no Patient Instructions on file for this visit.  This document serves as a record of services personally performed by Redell JUDITHANN Hans, MD, PhD. It was created on their behalf by Avelina Pereyra, COA an ophthalmic technician. The creation of this record is the provider's dictation and/or activities during the visit.   Electronically signed by: Avelina GORMAN Pereyra, COT  04/28/24  9:21 PM   This document serves as a record of services personally performed by Redell JUDITHANN Hans, MD, PhD. It was created on their behalf by Almetta Pesa, an ophthalmic technician. The creation of this record is the provider's dictation and/or activities during the visit.    Electronically signed by: Almetta Pesa, OA, 04/28/24  9:21 PM  Redell JUDITHANN Hans, M.D., Ph.D. Diseases & Surgery of the Retina and Vitreous [facility] 04/28/2024  I have reviewed the above documentation for accuracy and completeness, and I agree with the above. Redell JUDITHANN Hans, M.D., Ph.D. 04/28/24 9:29 PM   Abbreviations: M myopia (nearsighted); A astigmatism; H hyperopia (farsighted); P presbyopia; Mrx spectacle prescription;  CTL contact lenses; OD right eye; OS left eye; OU both eyes  XT exotropia; ET esotropia;  PEK punctate epithelial keratitis; PEE punctate epithelial erosions; DES dry eye syndrome; MGD meibomian gland dysfunction; ATs artificial tears; PFAT's preservative free artificial tears; NSC nuclear sclerotic cataract; PSC posterior subcapsular cataract; ERM epi-retinal membrane; PVD posterior vitreous detachment; RD retinal detachment; DM diabetes  mellitus; DR diabetic retinopathy; NPDR non-proliferative diabetic retinopathy; PDR proliferative diabetic retinopathy; CSME clinically significant macular edema; DME diabetic macular edema; dbh dot blot hemorrhages; CWS cotton wool spot; POAG primary open angle glaucoma; C/D cup-to-disc ratio; HVF humphrey visual field; GVF goldmann visual field; OCT optical coherence tomography; IOP intraocular pressure; BRVO Branch retinal vein occlusion; CRVO central retinal vein occlusion; CRAO central retinal artery occlusion; BRAO branch retinal artery occlusion; RT retinal tear; SB scleral buckle; PPV pars plana vitrectomy; VH Vitreous hemorrhage; PRP panretinal laser photocoagulation; IVK intravitreal kenalog; VMT vitreomacular traction; MH Macular hole;  NVD neovascularization of the disc; NVE neovascularization elsewhere; AREDS age related eye disease study; ARMD age related macular degeneration; POAG primary open angle glaucoma; EBMD epithelial/anterior basement membrane dystrophy; ACIOL anterior chamber intraocular lens; IOL intraocular lens; PCIOL posterior chamber intraocular lens; Phaco/IOL phacoemulsification with intraocular lens placement; PRK photorefractive keratectomy; LASIK laser assisted in situ keratomileusis; HTN hypertension; DM diabetes mellitus; COPD chronic obstructive pulmonary disease "

## 2024-04-28 ENCOUNTER — Ambulatory Visit (INDEPENDENT_AMBULATORY_CARE_PROVIDER_SITE_OTHER): Admitting: Ophthalmology

## 2024-04-28 ENCOUNTER — Encounter (INDEPENDENT_AMBULATORY_CARE_PROVIDER_SITE_OTHER): Payer: Self-pay | Admitting: Ophthalmology

## 2024-04-28 DIAGNOSIS — Z961 Presence of intraocular lens: Secondary | ICD-10-CM | POA: Diagnosis not present

## 2024-04-28 DIAGNOSIS — H35033 Hypertensive retinopathy, bilateral: Secondary | ICD-10-CM | POA: Diagnosis not present

## 2024-04-28 DIAGNOSIS — H35372 Puckering of macula, left eye: Secondary | ICD-10-CM

## 2024-04-28 DIAGNOSIS — I10 Essential (primary) hypertension: Secondary | ICD-10-CM | POA: Diagnosis not present

## 2024-04-28 DIAGNOSIS — H353122 Nonexudative age-related macular degeneration, left eye, intermediate dry stage: Secondary | ICD-10-CM

## 2024-04-28 DIAGNOSIS — H353211 Exudative age-related macular degeneration, right eye, with active choroidal neovascularization: Secondary | ICD-10-CM

## 2024-04-28 MED ORDER — FARICIMAB-SVOA 6 MG/0.05ML IZ SOLN
6.0000 mg | INTRAVITREAL | Status: AC | PRN
  Administered 2024-04-28: 6 mg via INTRAVITREAL

## 2024-05-02 ENCOUNTER — Other Ambulatory Visit: Payer: Self-pay | Admitting: Urology

## 2024-05-18 NOTE — Patient Instructions (Signed)
 SURGICAL WAITING ROOM VISITATION Patients having surgery or a procedure may have no more than 2 support people in the waiting area - these visitors may rotate in the visitor waiting room.   Due to an increase in RSV and influenza rates and associated hospitalizations, children ages 50 and under may not visit patients in [facility] hospitals. If the patient needs to stay at the hospital during part of their recovery, the visitor guidelines for inpatient rooms apply.  PRE-OP VISITATION  Pre-op nurse will coordinate an appropriate time for 1 support person to accompany the patient in pre-op.  This support person may not rotate.  This visitor will be contacted when the time is appropriate for the visitor to come back in the pre-op area.  Please refer to the [facility] website for the visitor guidelines for Inpatients (after your surgery is over and you are in a regular room).  You are not required to quarantine at this time prior to your surgery. However, you must do this: Hand Hygiene often Do NOT share personal items Notify your provider if you are in close contact with someone who has COVID or you develop fever 100.4 or greater, new onset of sneezing, cough, sore throat, shortness of breath or body aches.  If you test positive for Covid or have been in contact with anyone that has tested positive in the last 10 days please notify you surgeon.    Your procedure is scheduled on: 05/25/24   Report to [facility] Main Entrance: [place] entrance where the [employer] is available.   Report to admitting at: 10:30 AM  Call this number if you have any questions or problems the morning of surgery [phone]  FOLLOW ANY ADDITIONAL PRE OP INSTRUCTIONS YOU RECEIVED FROM YOUR SURGEON'S OFFICE!!!  Do not eat food or drink fluids after Midnight the night prior to your surgery/procedure.   Oral Hygiene is also important to reduce your risk of infection.        Remember - BRUSH YOUR TEETH  THE MORNING OF SURGERY WITH YOUR REGULAR TOOTHPASTE  Do NOT smoke after Midnight the night before surgery.  STOP TAKING all Vitamins, Herbs and supplements 1 week before your surgery.   Take ONLY these medicines the morning of surgery with A SIP OF WATER: alprazolam .                  You may not have any metal on your body including hair pins, jewelry, and body piercing  Do not wear make-up, lotions, powders, perfumes / cologne, or deodorant  Do not wear nail polish including gel and S&S, artificial / acrylic nails, or any other type of covering on natural nails including finger and toenails. If you have artificial nails, gel coating, etc., that needs to be removed by a nail salon, Please have this removed prior to surgery. Not doing so may mean that your surgery could be cancelled or delayed if the Surgeon or anesthesia staff feels like they are unable to monitor you safely.   Do not shave 48 hours prior to surgery to avoid nicks in your skin which may contribute to postoperative infections.   Contacts, Hearing Aids, dentures or bridgework may not be worn into surgery. DENTURES WILL BE REMOVED PRIOR TO SURGERY PLEASE DO NOT APPLY Poly grip OR ADHESIVES!!!  You may bring a small overnight bag with you on the day of surgery, only pack items that are not valuable. [facility] IS NOT RESPONSIBLE   FOR  VALUABLES THAT ARE LOST OR STOLEN.   Patients discharged on the day of surgery will not be allowed to drive home.  Someone NEEDS to stay with you for the first 24 hours after anesthesia.  Do not bring your home medications to the hospital. The Pharmacy will dispense medications listed on your medication list to you during your admission in the Hospital.  Special Instructions: Bring a copy of your healthcare power of attorney and living will documents the day of surgery, if you wish to have them scanned into your [facility] Records- EPIC  Please read over the following fact sheets you  were given: IF YOU HAVE QUESTIONS ABOUT YOUR PRE-OP INSTRUCTIONS, PLEASE CALL [phone]   [facility] - Preparing for Surgery      Before surgery, you can play an important role.  Because skin is not sterile, your skin needs to be as free of germs as possible.  You can reduce the number of germs on your skin by washing with CHG (chlorahexidine gluconate) soap before surgery.  CHG is an antiseptic cleaner which kills germs and bonds with the skin to continue killing germs even after washing. Please DO NOT use if you have an allergy to CHG or antibacterial soaps.  If your skin becomes reddened/irritated stop using the CHG and inform your nurse when you arrive at Short Stay. Do not shave (including legs and underarms) for at least 48 hours prior to the first CHG shower.  You may shave your face/neck.  Please follow these instructions carefully:  1.  Shower with CHG Soap the night before surgery ONLY (DO NOT USE THE SOAP THE MORNING OF SURGERY).  2.  If you choose to wash your hair, wash your hair first as usual with your normal  shampoo.  3.  After you shampoo, rinse your hair and body thoroughly to remove the shampoo.                             4.  Use CHG as you would any other liquid soap.  You can apply chg directly to the skin and wash.  Gently with a scrungie or clean washcloth.  5.  Apply the CHG Soap to your body ONLY FROM THE NECK DOWN.   Do not use on face/ open                           Wound or open sores. Avoid contact with eyes, ears mouth and genitals (private parts).                       Wash face,  Genitals (private parts) with your normal soap.             6.  Wash thoroughly, paying special attention to the area where your  surgery  will be performed.  7.  Thoroughly rinse your body with warm water from the neck down.  8.  DO NOT shower/wash with your normal soap after using and rinsing off the CHG Soap.                9.  Pat yourself dry with a clean towel.            10.   Wear clean pajamas.            11.  Place clean sheets on your bed the night of your  first shower and do not  sleep with pets.  Day of Surgery : Do not apply any CHG, lotions/deodorants the morning of surgery.  Please wear clean clothes to the hospital/surgery center.   FAILURE TO FOLLOW THESE INSTRUCTIONS MAY RESULT IN THE CANCELLATION OF YOUR SURGERY  PATIENT SIGNATURE_________________________________  NURSE SIGNATURE__________________________________  ________________________________________________________________________

## 2024-05-19 ENCOUNTER — Encounter (HOSPITAL_COMMUNITY): Admission: RE | Admit: 2024-05-19 | Discharge: 2024-05-19 | Attending: Urology

## 2024-05-19 ENCOUNTER — Other Ambulatory Visit: Payer: Self-pay

## 2024-05-19 ENCOUNTER — Encounter (HOSPITAL_COMMUNITY): Payer: Self-pay

## 2024-05-19 VITALS — BP 149/92 | HR 108 | Temp 98.3°F | Ht 59.0 in | Wt 126.0 lb

## 2024-05-19 DIAGNOSIS — Z01818 Encounter for other preprocedural examination: Secondary | ICD-10-CM

## 2024-05-19 HISTORY — DX: Personal history of urinary calculi: Z87.442

## 2024-05-19 LAB — CBC
HCT: 45 % (ref 36.0–46.0)
Hemoglobin: 14.8 g/dL (ref 12.0–15.0)
MCH: 30.5 pg (ref 26.0–34.0)
MCHC: 32.9 g/dL (ref 30.0–36.0)
MCV: 92.8 fL (ref 80.0–100.0)
Platelets: 302 10*3/uL (ref 150–400)
RBC: 4.85 MIL/uL (ref 3.87–5.11)
RDW: 13 % (ref 11.5–15.5)
WBC: 15.6 10*3/uL — ABNORMAL HIGH (ref 4.0–10.5)
nRBC: 0 % (ref 0.0–0.2)

## 2024-05-19 LAB — BASIC METABOLIC PANEL WITH GFR
Anion gap: 13 (ref 5–15)
BUN: 8 mg/dL (ref 8–23)
CO2: 24 mmol/L (ref 22–32)
Calcium: 9.4 mg/dL (ref 8.9–10.3)
Chloride: 108 mmol/L (ref 98–111)
Creatinine, Ser: 1.05 mg/dL — ABNORMAL HIGH (ref 0.44–1.00)
GFR, Estimated: 54 mL/min — ABNORMAL LOW
Glucose, Bld: 97 mg/dL (ref 70–99)
Potassium: 3.7 mmol/L (ref 3.5–5.1)
Sodium: 145 mmol/L (ref 135–145)

## 2024-05-19 NOTE — Progress Notes (Shared)
 " [facility] - Clinic Note  06/02/2024    CHIEF COMPLAINT Patient presents for No chief complaint on file.  HISTORY OF PRESENT ILLNESS: Kimberly Noble is a 79 y.o. female who presents to the clinic today for:    Patient states she thought she had a UTI a few weeks ago and was actually diagnosed w/ kidney stones. Seen at [facility]. Could have an ablation or a stent placed, uncertain. No changes notice vision wise  Referring physician: No referring provider defined for this encounter.  HISTORICAL INFORMATION:   Selected notes from the MEDICAL RECORD NUMBER Referred by Dr. [name] for PED OD LEE:  Ocular Hx- PMH-    CURRENT MEDICATIONS: No current outpatient medications on file. (Ophthalmic Drugs)   No current facility-administered medications for this visit. (Ophthalmic Drugs)   Current Outpatient Medications (Other)  Medication Sig   ALOE VERA PO Take 1 tablet by mouth daily.   ALPRAZolam  (XANAX ) 1 MG tablet Take 1 mg by mouth 3 (three) times daily. 7am, 1pm, 7pm   Ascorbic Acid (VITAMIN C PO) Take 1 capsule by mouth daily. supplement from [facility]   Biotin w/ Vitamins C & E (HAIR/SKIN/NAILS PO) Take 1 capsule by mouth daily. supplement from [facility]   CALCIUM-MAGNESIUM PO Take 2 tablets by mouth daily.   Cholecalciferol (VITAMIN D3 PO) Take 1 capsule by mouth daily. supplement from [facility]   Coenzyme Q10 (COQ10 PO) Take 1 capsule by mouth daily.   Collagen-Vitamin C-Biotin (COLLAGEN PO) Take 1 Scoop by mouth daily. Marine Collagen Peptides   CRANBERRY PO Take 1 capsule by mouth daily. supplement from [facility]   FLUoxetine (PROZAC) 20 MG capsule Take 60 mg by mouth every morning. (Patient not taking: Reported on 05/13/2024)   Glucosamine-Chondroit-Vit C-Mn (GLUCOSAMINE 1500 COMPLEX PO) Take 2 tablets by mouth daily.   Methylsulfonylmethane (MSM) 500 MG CAPS Take 1 capsule by mouth daily.   mirtazapine (REMERON SOL-TAB) 30 MG disintegrating tablet  Take 30 mg by mouth at bedtime. (Patient not taking: Reported on 05/13/2024)   Multiple Vitamin (MULTIVITAMIN WITH MINERALS) TABS tablet Take 1 tablet by mouth daily. supplement from [facility]   Omega-3 Fatty Acids (FISH OIL PO) Take 1 capsule by mouth daily. supplement from [facility]   OVER THE COUNTER MEDICATION Take 1 capsule by mouth daily. Joint support supplement from [facility]   OVER THE COUNTER MEDICATION Take 1 capsule by mouth daily. Cholesterol care supplement from [facility]   OVER THE COUNTER MEDICATION Take 2 capsules by mouth daily. Circulation and vein support supplement from [facility]   OVER THE COUNTER MEDICATION Take 1 capsule by mouth daily. Green vegetable supplement from [employer]   OVER THE COUNTER MEDICATION Take 1 capsule by mouth daily. Liver and brain supplement from [facility] (Patient not taking: Reported on 05/13/2024)   OVER THE COUNTER MEDICATION Take 1 capsule by mouth daily. Eye vitamin with lutein - supplement from [facility]   OVER THE COUNTER MEDICATION Take 1 capsule by mouth in the morning and at bedtime. [facility] phypoceramides   OVER THE COUNTER MEDICATION Take 1 capsule by mouth daily. [facility] Cruciferous   OVER THE COUNTER MEDICATION Take 1 capsule by mouth daily. Wellness mushroom complex   OVER THE COUNTER MEDICATION Take 1 capsule by mouth daily. Resveratrol   OVER THE COUNTER MEDICATION Take 2 capsules by mouth daily. Essential Amino Protein   OVER THE COUNTER MEDICATION Take 2 tablets  by mouth daily in the afternoon. Cholestocare   OVER THE COUNTER MEDICATION Take 1-2 capsules by mouth daily in the afternoon. Fibermucil   OVER THE COUNTER MEDICATION Take 1 capsule by mouth daily. Digest assure   OVER THE COUNTER MEDICATION Take 1 capsule by mouth daily. Ultimate Friendly Flora Priobiotic   OVER THE COUNTER MEDICATION Take 1 each by mouth daily. Secure complete meal replacement   VITAMIN K PO Take 1 capsule by mouth daily. Vitamin K2 mk-7 supplement from [facility]    No current facility-administered medications for this visit. (Other)   REVIEW OF SYSTEMS:   ALLERGIES Allergies  Allergen Reactions   Crab Extract Anaphylaxis    Eating crab causes severe allergy. Claims she is not allergic to all shellfish.    Penicillins Rash    Has patient had a PCN reaction causing immediate rash, facial/tongue/throat swelling, SOB or lightheadedness with hypotension: Yes Has patient had a PCN reaction causing severe rash involving mucus membranes or skin necrosis: No Has patient had a PCN reaction that required hospitalization No Has patient had a PCN reaction occurring within the last 10 years: No If all of the above answers are NO, then may proceed with Cephalosporin use.    PAST MEDICAL HISTORY Past Medical History:  Diagnosis Date   Agoraphobia with panic attacks    Anxiety attack    Major depression    Superficial laceration of hand ~ 03/2015   left thumb   Past Surgical History:  Procedure Laterality Date   TONSILLECTOMY  1950's   FAMILY HISTORY Family History  Problem Relation Age of Onset   Macular degeneration Mother    SOCIAL HISTORY Social History   Tobacco Use   Smoking status: Every Day    Current packs/day: 0.33    Average packs/day: 0.3 packs/day for 40.0 years (13.2 ttl pk-yrs)    Types: Cigarettes   Smokeless tobacco: Never  Vaping Use   Vaping status: Never Used  Substance Use Topics   Alcohol use: No   Drug use: No       OPHTHALMIC EXAM:  Not recorded    IMAGING AND PROCEDURES  Imaging and Procedures for 06/02/2024            ASSESSMENT/PLAN:    ICD-10-CM   1. Exudative age-related macular degeneration of right eye with active choroidal neovascularization (HCC)  H35.3211     2. 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 Epiretinal membrane (ERM) of left eye  H35.372     4. Essential hypertension  I10     5. Hypertensive retinopathy of both  eyes  H35.033     6. Pseudophakia of both eyes  Z96.1        1. 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, right eye - pt presented initially on 3.16.23 with 2 wk history of central vision loss and distortion  - s/p IVA OD #1 (03.16.23), #2 (04.13.23), #3 (05.11.23), #4 (06.08.23)  -- IVA resistance ============================== - s/p IVE OD #1 (07.06.23), #2 (08.03.23), #3 (08.31.23), #4 (10.05.23)  -- IVE resistance =============================== - s/p IVV OD #1 (11.09.23), #2 (12.07.23), #3 (01.04.24), #4 (02.01.24), #5 (02.29.24), #6 (03.15.24), #7 (04.25.24), #8 (05.30.24), #9 (06.27.24), #10 (07.25.24), #11 (08.22.24), #12 (09.19.24), #13 (10.17.24), #14 (11.14.24), #15 (12.12.24), #16 (01.09.25), #17 (02.06.25 sample), #18 (06.05.25 sample], #19 (08.14.25 sample), #20 (09.11.25 sample), #21 (10.09.25 sample) #22 (11.06.25 sample), #23 (12.10.25 sample),  #24 (01.15.26)  ================================ - s/p IVE HD  OD #1 (03.13.25), #2 (04.10.25), #3 (sample 05.08.25), #4 (sample 07.10.25) **history of increased IRF at 5 weeks, noted on 07.11.25, 03.13.25 and 05.30.24 exams** - BCVA OD 20/30 stable - OCT shows Stable improvement in IRF, persistent central PED and focal SRF--slightly increased, partial PVD at 5 weeks - recommend IVV sample today OD #25 (02.19.26) w/ f/u in 4-5 wks -- [employer] funding unavailable **previously alternating IVE HD and IVV OD, but now sticking w/ IVV** - pt wishes to proceed with injection - RBA of procedure discussed, questions answered - IVV informed consent obtained and signed, 02.06.25 (OD) - IVE HD informed consent obtained and signed, 03.13.25 (OD) - see procedure note  - f/u 4-5 weeks, DFE, OCT, possible injection  2. Age related macular degeneration, non-exudative, left eye  - mild/intermediate stage with mild drusen - The incidence, anatomy, and pathology of dry AMD, risk of progression, and the AREDS and AREDS 2 study including smoking  risks discussed with patient.   - recommend Amsler grid monitoring  3. Epiretinal membrane, left eye  - mild ERM OS w/ early pucker, SN macula - BCVA 20/25 -- improved - asymptomatic, no metamorphopsia - no indication for surgery at this time - monitor  4,5. Hypertensive retinopathy OU - discussed importance of tight BP control - monitor  6. Pseudophakia OU  - s/p CE/IOL OU (Dr. Meridee - OS 01.14.25, OD 04.01.25)  - toric IOLs in good position  - s/p YAG OS w/ Dr. Meridee on 10.06.25  - monitor  Ophthalmic Meds Ordered this visit:  No orders of the defined types were placed in this encounter.    No follow-ups on file.  There are no Patient Instructions on file for this visit.  This document serves as a record of services personally performed by Redell JUDITHANN Hans, MD, PhD. It was created on their behalf by Avelina Pereyra, COA an ophthalmic technician. The creation of this record is the provider's dictation and/or activities during the visit.   Electronically signed by: Avelina GORMAN Pereyra, COT  05/19/24  10:43 AM   Redell JUDITHANN Hans, M.D., Ph.D. Diseases & Surgery of the Retina and Vitreous [facility] 06/02/2024  Abbreviations: M myopia (nearsighted); A astigmatism; H hyperopia (farsighted); P presbyopia; Mrx spectacle prescription;  CTL contact lenses; OD right eye; OS left eye; OU both eyes  XT exotropia; ET esotropia; PEK punctate epithelial keratitis; PEE punctate epithelial erosions; DES dry eye syndrome; MGD meibomian gland dysfunction; ATs artificial tears; PFAT's preservative free artificial tears; NSC nuclear sclerotic cataract; PSC posterior subcapsular cataract; ERM epi-retinal membrane; PVD posterior vitreous detachment; RD retinal detachment; DM diabetes mellitus; DR diabetic retinopathy; NPDR non-proliferative diabetic retinopathy; PDR proliferative diabetic retinopathy; CSME clinically significant macular edema; DME diabetic macular edema;  dbh dot blot hemorrhages; CWS cotton wool spot; POAG primary open angle glaucoma; C/D cup-to-disc ratio; HVF humphrey visual field; GVF goldmann visual field; OCT optical coherence tomography; IOP intraocular pressure; BRVO Branch retinal vein occlusion; CRVO central retinal vein occlusion; CRAO central retinal artery occlusion; BRAO branch retinal artery occlusion; RT retinal tear; SB scleral buckle; PPV pars plana vitrectomy; VH Vitreous hemorrhage; PRP panretinal laser photocoagulation; IVK intravitreal kenalog; VMT vitreomacular traction; MH Macular hole;  NVD neovascularization of the disc; NVE neovascularization elsewhere; AREDS age related eye disease study; ARMD age related macular degeneration; POAG primary open angle glaucoma; EBMD epithelial/anterior basement membrane dystrophy; ACIOL anterior chamber intraocular lens; IOL intraocular lens; PCIOL posterior chamber intraocular lens; Phaco/IOL phacoemulsification with intraocular lens placement;  PRK photorefractive keratectomy; LASIK laser assisted in situ keratomileusis; HTN hypertension; DM diabetes mellitus; COPD chronic obstructive pulmonary disease "

## 2024-05-19 NOTE — Progress Notes (Signed)
Lab results. WBC: 15.6.

## 2024-05-19 NOTE — Progress Notes (Signed)
 For Anesthesia: PCP - NO PCP Cardiologist - N/A  Bowel Prep reminder:N/A  Chest x-ray -  EKG - N/A Stress Test -  ECHO -  Cardiac Cath -  Pacemaker/ICD device last checked: Pacemaker orders received: Device Rep notified:  Spinal Cord Stimulator:N/A  Sleep Study - N/A CPAP -   Fasting Blood Sugar - N/A Checks Blood Sugar _____ times a day Date and result of last Hgb A1c-  Last dose of GLP1 agonist- N/A GLP1 instructions: Hold 7 days prior to schedule (Hold 24 hours-daily)   Last dose of SGLT-2 inhibitors- N/A SGLT-2 instructions: Hold 72 hours prior to surgery  Blood Thinner Instructions:N/A Last Dose: Time last taken:  Aspirin Instructions:N/A Last Dose: Time last taken:  Activity level: Can go up a flight of stairs and activities of daily living without stopping and without chest pain and/or shortness of breath   Able to exercise without chest pain and/or shortness of breath  Anesthesia review: Hx: Panic attacks,Smoker.  Patient denies shortness of breath, fever, cough and chest pain at PAT appointment   Patient verbalized understanding of instructions that were reviewed over the telephone.

## 2024-05-25 ENCOUNTER — Encounter (HOSPITAL_COMMUNITY): Admission: RE | Payer: Self-pay | Source: Home / Self Care

## 2024-05-25 ENCOUNTER — Ambulatory Visit (HOSPITAL_COMMUNITY): Admission: RE | Admit: 2024-05-25 | Source: Home / Self Care | Admitting: Urology

## 2024-05-25 SURGERY — CYSTOSCOPY/URETEROSCOPY/HOLMIUM LASER/STENT PLACEMENT
Anesthesia: General | Laterality: Left

## 2024-05-26 ENCOUNTER — Encounter (INDEPENDENT_AMBULATORY_CARE_PROVIDER_SITE_OTHER): Admitting: Ophthalmology

## 2024-06-02 ENCOUNTER — Encounter (INDEPENDENT_AMBULATORY_CARE_PROVIDER_SITE_OTHER): Admitting: Ophthalmology

## 2024-06-02 DIAGNOSIS — H35033 Hypertensive retinopathy, bilateral: Secondary | ICD-10-CM

## 2024-06-02 DIAGNOSIS — H35372 Puckering of macula, left eye: Secondary | ICD-10-CM

## 2024-06-02 DIAGNOSIS — H353122 Nonexudative age-related macular degeneration, left eye, intermediate dry stage: Secondary | ICD-10-CM

## 2024-06-02 DIAGNOSIS — I10 Essential (primary) hypertension: Secondary | ICD-10-CM

## 2024-06-02 DIAGNOSIS — H353211 Exudative age-related macular degeneration, right eye, with active choroidal neovascularization: Secondary | ICD-10-CM

## 2024-06-02 DIAGNOSIS — Z961 Presence of intraocular lens: Secondary | ICD-10-CM
